# Patient Record
Sex: Female | Born: 1965
Health system: Southern US, Community
[De-identification: ages and names within clinical notes are randomized; demographics above are authoritative.]

## PROBLEM LIST (undated history)

## (undated) DIAGNOSIS — E538 Deficiency of other specified B group vitamins: Secondary | ICD-10-CM

## (undated) DIAGNOSIS — E539 Vitamin B deficiency, unspecified: Secondary | ICD-10-CM

## (undated) DIAGNOSIS — F32A Depression, unspecified: Secondary | ICD-10-CM

## (undated) DIAGNOSIS — G4730 Sleep apnea, unspecified: Secondary | ICD-10-CM

## (undated) DIAGNOSIS — L0390 Cellulitis, unspecified: Secondary | ICD-10-CM

## (undated) DIAGNOSIS — D509 Iron deficiency anemia, unspecified: Secondary | ICD-10-CM

## (undated) DIAGNOSIS — T7840XA Allergy, unspecified, initial encounter: Secondary | ICD-10-CM

## (undated) DIAGNOSIS — M7060 Trochanteric bursitis, unspecified hip: Secondary | ICD-10-CM

## (undated) DIAGNOSIS — J45909 Unspecified asthma, uncomplicated: Secondary | ICD-10-CM

## (undated) DIAGNOSIS — K635 Polyp of colon: Secondary | ICD-10-CM

## (undated) DIAGNOSIS — F329 Major depressive disorder, single episode, unspecified: Secondary | ICD-10-CM

## (undated) DIAGNOSIS — E038 Other specified hypothyroidism: Secondary | ICD-10-CM

## (undated) DIAGNOSIS — F419 Anxiety disorder, unspecified: Secondary | ICD-10-CM

## (undated) DIAGNOSIS — J189 Pneumonia, unspecified organism: Secondary | ICD-10-CM

## (undated) DIAGNOSIS — J329 Chronic sinusitis, unspecified: Secondary | ICD-10-CM

## (undated) DIAGNOSIS — E063 Autoimmune thyroiditis: Secondary | ICD-10-CM

## (undated) DIAGNOSIS — R519 Headache, unspecified: Secondary | ICD-10-CM

## (undated) DIAGNOSIS — K8020 Calculus of gallbladder without cholecystitis without obstruction: Secondary | ICD-10-CM

## (undated) DIAGNOSIS — E669 Obesity, unspecified: Secondary | ICD-10-CM

## (undated) DIAGNOSIS — R51 Headache: Secondary | ICD-10-CM

## (undated) DIAGNOSIS — E559 Vitamin D deficiency, unspecified: Secondary | ICD-10-CM

## (undated) DIAGNOSIS — M5416 Radiculopathy, lumbar region: Secondary | ICD-10-CM

## (undated) DIAGNOSIS — M5430 Sciatica, unspecified side: Secondary | ICD-10-CM

## (undated) DIAGNOSIS — R739 Hyperglycemia, unspecified: Secondary | ICD-10-CM

## (undated) DIAGNOSIS — R002 Palpitations: Secondary | ICD-10-CM

## (undated) DIAGNOSIS — D649 Anemia, unspecified: Secondary | ICD-10-CM

## (undated) DIAGNOSIS — G4700 Insomnia, unspecified: Secondary | ICD-10-CM

## (undated) DIAGNOSIS — E7800 Pure hypercholesterolemia, unspecified: Secondary | ICD-10-CM

## (undated) DIAGNOSIS — J3089 Other allergic rhinitis: Secondary | ICD-10-CM

## (undated) HISTORY — DX: Morbid (severe) obesity due to excess calories: E66.01

## (undated) HISTORY — DX: Obesity, unspecified: E66.9

## (undated) HISTORY — PX: EYE SURGERY: SHX253

## (undated) HISTORY — DX: Headache: R51

## (undated) HISTORY — PX: REFRACTIVE SURGERY: SHX103

## (undated) HISTORY — DX: Headache, unspecified: R51.9

## (undated) HISTORY — DX: Palpitations: R00.2

## (undated) HISTORY — DX: Calculus of gallbladder without cholecystitis without obstruction: K80.20

## (undated) HISTORY — DX: Other specified hypothyroidism: E03.8

## (undated) HISTORY — PX: CHOLECYSTECTOMY: SHX55

## (undated) HISTORY — DX: Cellulitis, unspecified: L03.90

## (undated) HISTORY — DX: Radiculopathy, lumbar region: M54.16

## (undated) HISTORY — DX: Pneumonia, unspecified organism: J18.9

## (undated) HISTORY — DX: Vitamin D deficiency, unspecified: E55.9

## (undated) HISTORY — DX: Autoimmune thyroiditis: E06.3

## (undated) HISTORY — DX: Pure hypercholesterolemia, unspecified: E78.00

## (undated) HISTORY — DX: Other allergic rhinitis: J30.89

## (undated) HISTORY — DX: Deficiency of other specified B group vitamins: E53.8

## (undated) HISTORY — DX: Vitamin B deficiency, unspecified: E53.9

## (undated) HISTORY — PX: WRIST SURGERY: SHX841

## (undated) HISTORY — DX: Sleep apnea, unspecified: G47.30

## (undated) HISTORY — DX: Insomnia, unspecified: G47.00

## (undated) HISTORY — DX: Polyp of colon: K63.5

## (undated) HISTORY — DX: Allergy, unspecified, initial encounter: T78.40XA

## (undated) HISTORY — PX: EYE MUSCLE SURGERY: SHX370

## (undated) HISTORY — DX: Trochanteric bursitis, unspecified hip: M70.60

## (undated) HISTORY — DX: Hyperglycemia, unspecified: R73.9

## (undated) HISTORY — DX: Chronic sinusitis, unspecified: J32.9

## (undated) HISTORY — PX: GASTRIC BYPASS: SHX52

## (undated) HISTORY — DX: Iron deficiency anemia, unspecified: D50.9

## (undated) HISTORY — PX: ORTHOPEDIC SURGERY: SHX850

## (undated) HISTORY — DX: Sciatica, unspecified side: M54.30

---

## 2011-09-08 ENCOUNTER — Encounter: Payer: Self-pay | Admitting: Internal Medicine

## 2012-04-26 ENCOUNTER — Encounter (HOSPITAL_COMMUNITY): Payer: Self-pay

## 2012-04-26 ENCOUNTER — Inpatient Hospital Stay (HOSPITAL_COMMUNITY)
Admission: EM | Admit: 2012-04-26 | Discharge: 2012-05-02 | DRG: 493 | Disposition: A | Discharge status: Home / Self Care | Payer: BC Managed Care – PPO | Attending: General Surgery | Admitting: General Surgery

## 2012-04-26 ENCOUNTER — Emergency Department (HOSPITAL_COMMUNITY): Payer: BC Managed Care – PPO

## 2012-04-26 DIAGNOSIS — K810 Acute cholecystitis: Principal | ICD-10-CM | POA: Diagnosis present

## 2012-04-26 DIAGNOSIS — Z79899 Other long term (current) drug therapy: Secondary | ICD-10-CM

## 2012-04-26 DIAGNOSIS — F329 Major depressive disorder, single episode, unspecified: Secondary | ICD-10-CM | POA: Diagnosis present

## 2012-04-26 DIAGNOSIS — F411 Generalized anxiety disorder: Secondary | ICD-10-CM | POA: Diagnosis present

## 2012-04-26 DIAGNOSIS — Z9884 Bariatric surgery status: Secondary | ICD-10-CM

## 2012-04-26 DIAGNOSIS — F3289 Other specified depressive episodes: Secondary | ICD-10-CM | POA: Diagnosis present

## 2012-04-26 DIAGNOSIS — J45909 Unspecified asthma, uncomplicated: Secondary | ICD-10-CM | POA: Diagnosis present

## 2012-04-26 DIAGNOSIS — D62 Acute posthemorrhagic anemia: Secondary | ICD-10-CM | POA: Diagnosis not present

## 2012-04-26 HISTORY — DX: Anxiety disorder, unspecified: F41.9

## 2012-04-26 HISTORY — DX: Depression, unspecified: F32.A

## 2012-04-26 HISTORY — DX: Major depressive disorder, single episode, unspecified: F32.9

## 2012-04-26 HISTORY — DX: Anemia, unspecified: D64.9

## 2012-04-26 HISTORY — DX: Unspecified asthma, uncomplicated: J45.909

## 2012-04-26 LAB — URINALYSIS, ROUTINE W REFLEX MICROSCOPIC
Glucose, UA: NEGATIVE mg/dL
Hgb urine dipstick: NEGATIVE
Leukocytes, UA: NEGATIVE
Protein, ur: 30 mg/dL — AB
Specific Gravity, Urine: 1.015 (ref 1.005–1.030)

## 2012-04-26 LAB — COMPREHENSIVE METABOLIC PANEL
ALT: 9 U/L (ref 0–35)
AST: 14 U/L (ref 0–37)
Albumin: 3.3 g/dL — ABNORMAL LOW (ref 3.5–5.2)
Calcium: 9 mg/dL (ref 8.4–10.5)
Creatinine, Ser: 0.72 mg/dL (ref 0.50–1.10)
GFR calc non Af Amer: >90 mL/min (ref >90)
Sodium: 139 mEq/L (ref 135–145)
Total Protein: 6.6 g/dL (ref 6.0–8.3)

## 2012-04-26 LAB — CBC WITH DIFFERENTIAL/PLATELET
Basophils Absolute: 0 10*3/uL (ref 0.0–0.1)
Basophils Relative: 0 % (ref 0–1)
Eosinophils Absolute: 0.2 10*3/uL (ref 0.0–0.7)
Eosinophils Relative: 2 % (ref 0–5)
HCT: 38.7 % (ref 36.0–46.0)
Lymphocytes Relative: 12 % (ref 12–46)
MCH: 31.1 pg (ref 26.0–34.0)
MCHC: 32.3 g/dL (ref 30.0–36.0)
MCV: 96.3 fL (ref 78.0–100.0)
Monocytes Absolute: 0.9 10*3/uL (ref 0.1–1.0)
Platelets: 203 10*3/uL (ref 150–400)
RDW: 15 % (ref 11.5–15.5)
WBC: 9.7 10*3/uL (ref 4.0–10.5)

## 2012-04-26 LAB — URINE MICROSCOPIC-ADD ON

## 2012-04-26 MED ORDER — MORPHINE SULFATE 4 MG/ML IJ SOLN: 2.0000 mg | Freq: Once | INTRAMUSCULAR | Status: DC

## 2012-04-26 MED ORDER — HYDROMORPHONE HCL PF 1 MG/ML IJ SOLN: 1.0000 mg | Freq: Once | INTRAMUSCULAR | Status: AC

## 2012-04-26 MED ORDER — HYDROMORPHONE HCL PF 1 MG/ML IJ SOLN
1.0000 mg | Freq: Once | INTRAMUSCULAR | Status: AC
  Administered 2012-04-26: 1 mg via INTRAVENOUS
  Filled 2012-04-26 (×2): qty 1

## 2012-04-26 MED ORDER — MORPHINE SULFATE 4 MG/ML IJ SOLN
2.0000 mg | Freq: Once | INTRAMUSCULAR | Status: AC
  Administered 2012-04-26: 06:00:00 via INTRAVENOUS
  Filled 2012-04-26: qty 1

## 2012-04-26 MED ORDER — PANTOPRAZOLE SODIUM 40 MG IV SOLR
INTRAVENOUS | Status: AC
  Filled 2012-04-26: qty 40

## 2012-04-26 MED ORDER — PANTOPRAZOLE SODIUM 40 MG IV SOLR
40.0000 mg | INTRAVENOUS | Status: AC
  Administered 2012-04-26: 40 mg via INTRAVENOUS

## 2012-04-26 MED ORDER — ONDANSETRON HCL 4 MG/2ML IJ SOLN
4.0000 mg | Freq: Once | INTRAMUSCULAR | Status: AC
  Administered 2012-04-26: 4 mg via INTRAVENOUS
  Filled 2012-04-26 (×2): qty 2

## 2012-04-26 MED ORDER — HYDROMORPHONE HCL PF 1 MG/ML IJ SOLN
1.0000 mg | INTRAMUSCULAR | Status: DC | PRN
  Administered 2012-04-27: 2 mg via INTRAVENOUS
  Administered 2012-04-27: 1 mg via INTRAVENOUS
  Filled 2012-04-26 (×2): qty 2

## 2012-04-26 MED ORDER — HYDROMORPHONE HCL PF 1 MG/ML IJ SOLN
1.0000 mg | INTRAMUSCULAR | Status: AC | PRN
  Administered 2012-04-26 (×6): 1 mg via INTRAVENOUS
  Filled 2012-04-26 (×6): qty 1

## 2012-04-26 MED ORDER — MORPHINE SULFATE 2 MG/ML IJ SOLN
2.0000 mg | Freq: Once | INTRAMUSCULAR | Status: AC
  Administered 2012-04-26: 2 mg via INTRAVENOUS
  Filled 2012-04-26: qty 1

## 2012-04-26 MED ORDER — SODIUM CHLORIDE 0.9 % IV SOLN: INTRAVENOUS | Status: DC

## 2012-04-26 MED ORDER — PANTOPRAZOLE SODIUM 40 MG IV SOLR
40.0000 mg | Freq: Every day | INTRAVENOUS | Status: DC
  Administered 2012-04-26 – 2012-04-27 (×2): 40 mg via INTRAVENOUS
  Filled 2012-04-26 (×2): qty 40

## 2012-04-26 MED ORDER — SODIUM CHLORIDE 0.9 % IV SOLN
Freq: Once | INTRAVENOUS | Status: AC
  Administered 2012-04-26: 1000 mL via INTRAVENOUS

## 2012-04-26 MED ORDER — HYDROMORPHONE HCL PF 1 MG/ML IJ SOLN
INTRAMUSCULAR | Status: AC
  Administered 2012-04-26: 1 mg via INTRAVENOUS
  Filled 2012-04-26: qty 1

## 2012-04-26 MED ORDER — ONDANSETRON HCL 4 MG/2ML IJ SOLN
4.0000 mg | Freq: Four times a day (QID) | INTRAMUSCULAR | Status: AC | PRN
  Filled 2012-04-26: qty 2

## 2012-04-26 MED ORDER — ONDANSETRON HCL 4 MG/2ML IJ SOLN
4.0000 mg | Freq: Four times a day (QID) | INTRAMUSCULAR | Status: DC | PRN
  Administered 2012-04-26 – 2012-04-27 (×2): 4 mg via INTRAVENOUS

## 2012-04-26 MED ORDER — ENOXAPARIN SODIUM 40 MG/0.4ML ~~LOC~~ SOLN
40.0000 mg | SUBCUTANEOUS | Status: DC
  Administered 2012-04-26 – 2012-05-01 (×5): 40 mg via SUBCUTANEOUS
  Filled 2012-04-26 (×6): qty 0.4

## 2012-04-26 MED ORDER — LACTATED RINGERS IV SOLN
INTRAVENOUS | Status: DC
  Administered 2012-04-26 – 2012-04-30 (×11): via INTRAVENOUS

## 2012-04-26 MED ORDER — ONDANSETRON HCL 4 MG/2ML IJ SOLN
4.0000 mg | Freq: Once | INTRAMUSCULAR | Status: AC
  Administered 2012-04-26: 4 mg via INTRAVENOUS
  Filled 2012-04-26: qty 2

## 2012-04-26 NOTE — ED Notes (Addendum)
Patient states pain is returning to abdomen going up toward her chest area. Spoke with her nurse Marius Ditch RN.

## 2012-04-26 NOTE — ED Notes (Signed)
Pt c/o pain to right upper abd that sometimes radiates around to the right flank, mild nausea, no vomiting or diarrhea

## 2012-04-26 NOTE — ED Provider Notes (Deleted)
History     CSN: [id number]  Arrival date & time 04/26/12  0519   First MD Initiated Contact with Patient 04/26/12 0534      Chief Complaint  Patient presents with  . Abdominal Pain    (Consider location/radiation/quality/duration/timing/severity/associated sxs/prior treatment) HPI Kristy Sharp is a 47 y.o. female who presents to the Emergency Department complaining of RUQ pain that began last night around 10 PM and lasted until midnight. She went to work at 4 AM and it became worse. Now RUQ pain with radiation to R flank is constant, associated with nausea. Denies fever, chills, vomiting, diarrhea. She last ate at 7 PM.  PCP [place], Jonita Albee Past Medical History  Diagnosis Date  . Anemia   . Depression   . Anxiety   . Asthma     Past Surgical History  Procedure Laterality Date  . Gastric bypass    . Orthopedic surgery      No family history on file.  History  Substance Use Topics  . Smoking status: Never Smoker   . Smokeless tobacco: Not on file  . Alcohol Use: Yes    OB History   Grav Para Term Preterm Abortions TAB SAB Ect Mult Living                  Review of Systems  Constitutional: Negative for fever.       10 Systems reviewed and are negative for acute change except as noted in the HPI.  HENT: Negative for congestion.   Eyes: Negative for discharge and redness.  Respiratory: Negative for cough and shortness of breath.   Cardiovascular: Negative for chest pain.  Gastrointestinal: Negative for vomiting and abdominal pain.       RUQ pain  Musculoskeletal: Negative for back pain.  Skin: Negative for rash.  Neurological: Negative for syncope, numbness and headaches.  Psychiatric/Behavioral:       No behavior change.    Allergies  Review of patient's allergies indicates no known allergies.  Home Medications  No current outpatient prescriptions on file.  BP 148/74  Pulse 74  Temp(Src) 99.1 F (37.3 C) (Oral)  Ht 5\' 6"  (1.676 m)  Wt 340 lb  (154.223 kg)  BMI 54.9 kg/m2  SpO2 100%  LMP 04/12/2012  Physical Exam  Nursing note and vitals reviewed. Constitutional: She appears well-developed and well-nourished.  Awake, alert, nontoxic appearance.  HENT:  Head: Normocephalic and atraumatic.  Eyes: EOM are normal. Pupils are equal, round, and reactive to light. Right eye exhibits no discharge. Left eye exhibits no discharge.  Neck: Normal range of motion. Neck supple.  Cardiovascular: Normal rate and normal heart sounds.   Pulmonary/Chest: Effort normal and breath sounds normal. She exhibits no tenderness.  Abdominal: Soft. There is tenderness. There is no rebound and no guarding.  RUQ discomfort with palpation.  Musculoskeletal: She exhibits no tenderness.  Baseline ROM, no obvious new focal weakness.  Neurological:  Mental status and motor strength appears baseline for patient and situation.  Skin: No rash noted.  Psychiatric: She has a normal mood and affect.    ED Course  Procedures (including critical care time) Results for orders placed during the hospital encounter of 04/26/12  CBC WITH DIFFERENTIAL      Result Value Range   WBC 9.7  4.0 - 10.5 K/uL   RBC 4.02  3.87 - 5.11 MIL/uL   Hemoglobin 12.5  12.0 - 15.0 g/dL   HCT 84.6  96.2 - 95.2 %   MCV  96.3  78.0 - 100.0 fL   MCH 31.1  26.0 - 34.0 pg   MCHC 32.3  30.0 - 36.0 g/dL   RDW 16.1  09.6 - 04.5 %   Platelets 203  150 - 400 K/uL   Neutrophils Relative 76  43 - 77 %   Neutro Abs 7.3  1.7 - 7.7 K/uL   Lymphocytes Relative 12  12 - 46 %   Lymphs Abs 1.2  0.7 - 4.0 K/uL   Monocytes Relative 10  3 - 12 %   Monocytes Absolute 0.9  0.1 - 1.0 K/uL   Eosinophils Relative 2  0 - 5 %   Eosinophils Absolute 0.2  0.0 - 0.7 K/uL   Basophils Relative 0  0 - 1 %   Basophils Absolute 0.0  0.0 - 0.1 K/uL  COMPREHENSIVE METABOLIC PANEL      Result Value Range   Sodium 139  135 - 145 mEq/L   Potassium 3.4 (*) 3.5 - 5.1 mEq/L   Chloride 105  96 - 112 mEq/L   CO2 25   19 - 32 mEq/L   Glucose, Bld 112 (*) 70 - 99 mg/dL   BUN 12  6 - 23 mg/dL   Creatinine, Ser 4.09  0.50 - 1.10 mg/dL   Calcium 9.0  8.4 - 81.1 mg/dL   Total Protein 6.6  6.0 - 8.3 g/dL   Albumin 3.3 (*) 3.5 - 5.2 g/dL   AST 14  0 - 37 U/L   ALT 9  0 - 35 U/L   Alkaline Phosphatase 54  39 - 117 U/L   Total Bilirubin 0.3  0.3 - 1.2 mg/dL   GFR calc non Af Amer >90  >90 mL/min   GFR calc Af Amer >90  >90 mL/min  AMYLASE      Result Value Range   Amylase 33  0 - 105 U/L       MDM  Patient with RUQ pain with radiation to the R flank. Given IVF, morphine, zofran. Scheduled for [place] this AM. Labs are unremarkable.         Nicoletta Dress. Colon Branch, MD 04/26/12 [phone]

## 2012-04-26 NOTE — ED Notes (Signed)
Pt states her pain is coming back. EDP aware. meds ordered

## 2012-04-26 NOTE — ED Provider Notes (Signed)
History     CSN: [id number]  Arrival date & time 04/26/12  0519   First MD Initiated Contact with Patient 04/26/12 0534      Chief Complaint  Patient presents with  . Abdominal Pain    (Consider location/radiation/quality/duration/timing/severity/associated sxs/prior treatment) HPI  Past Medical History  Diagnosis Date  . Anemia   . Depression   . Anxiety   . Asthma     Past Surgical History  Procedure Laterality Date  . Gastric bypass    . Orthopedic surgery      No family history on file.  History  Substance Use Topics  . Smoking status: Never Smoker   . Smokeless tobacco: Not on file  . Alcohol Use: Yes    OB History   Grav Para Term Preterm Abortions TAB SAB Ect Mult Living                  Review of Systems  Allergies  Review of patient's allergies indicates no known allergies.  Home Medications  No current outpatient prescriptions on file.  BP 148/74  Pulse 74  Temp(Src) 99.1 F (37.3 C) (Oral)  Ht 5\' 6"  (1.676 m)  Wt 340 lb (154.223 kg)  BMI 54.9 kg/m2  SpO2 100%  LMP 04/12/2012  Physical Exam  ED Course  Procedures (including critical care time) Results for orders placed during the hospital encounter of 04/26/12  CBC WITH DIFFERENTIAL      Result Value Range   WBC 9.7  4.0 - 10.5 K/uL   RBC 4.02  3.87 - 5.11 MIL/uL   Hemoglobin 12.5  12.0 - 15.0 g/dL   HCT 08.6  57.8 - 46.9 %   MCV 96.3  78.0 - 100.0 fL   MCH 31.1  26.0 - 34.0 pg   MCHC 32.3  30.0 - 36.0 g/dL   RDW 62.9  52.8 - 41.3 %   Platelets 203  150 - 400 K/uL   Neutrophils Relative 76  43 - 77 %   Neutro Abs 7.3  1.7 - 7.7 K/uL   Lymphocytes Relative 12  12 - 46 %   Lymphs Abs 1.2  0.7 - 4.0 K/uL   Monocytes Relative 10  3 - 12 %   Monocytes Absolute 0.9  0.1 - 1.0 K/uL   Eosinophils Relative 2  0 - 5 %   Eosinophils Absolute 0.2  0.0 - 0.7 K/uL   Basophils Relative 0  0 - 1 %   Basophils Absolute 0.0  0.0 - 0.1 K/uL  COMPREHENSIVE METABOLIC PANEL      Result  Value Range   Sodium 139  135 - 145 mEq/L   Potassium 3.4 (*) 3.5 - 5.1 mEq/L   Chloride 105  96 - 112 mEq/L   CO2 25  19 - 32 mEq/L   Glucose, Bld 112 (*) 70 - 99 mg/dL   BUN 12  6 - 23 mg/dL   Creatinine, Ser 2.44  0.50 - 1.10 mg/dL   Calcium 9.0  8.4 - 01.0 mg/dL   Total Protein 6.6  6.0 - 8.3 g/dL   Albumin 3.3 (*) 3.5 - 5.2 g/dL   AST 14  0 - 37 U/L   ALT 9  0 - 35 U/L   Alkaline Phosphatase 54  39 - 117 U/L   Total Bilirubin 0.3  0.3 - 1.2 mg/dL   GFR calc non Af Amer >90  >90 mL/min   GFR calc Af Amer >90  >90 mL/min  AMYLASE      Result Value Range   Amylase 33  0 - 105 U/L      MDM  Patient with RUQ pain that radiates into her right flank and epigastric area. Given IVF, Morphine, Zofran. Awaiting CT scan.   MDM Reviewed: nursing note and vitals Interpretation: labs           Nicoletta Dress. Colon Branch, MD 04/26/12 [phone]

## 2012-04-26 NOTE — H&P (Signed)
Kristy Sharp is an 47 y.o. female.   Chief Complaint: Right upper quadrant pain HPI: Patient has had Right upper quadrant pain over the last several days.  Associated nausea.  Some fever and chills.  No change with BM.  No melena.  NO hematochezia.  Pain increased with fatty foods.  Some bloating.  NO jaundice.    Past Medical History  Diagnosis Date  . Anemia   . Depression   . Anxiety   . Asthma     Past Surgical History  Procedure Laterality Date  . Gastric bypass    . Orthopedic surgery      History reviewed. No pertinent family history. Social History:  reports that she has never smoked. She does not have any smokeless tobacco history on file. She reports that  drinks alcohol. She reports that she does not use illicit drugs.  Allergies: No Known Allergies  Medications Prior to Admission  Medication Sig Dispense Refill  . albuterol (PROVENTIL HFA;VENTOLIN HFA) 108 (90 BASE) MCG/ACT inhaler Inhale 2 puffs into the lungs every 6 (six) hours as needed for wheezing.      Marland Kitchen buPROPion (WELLBUTRIN) 100 MG tablet Take 100 mg by mouth 2 (two) times daily.      . citalopram (CELEXA) 40 MG tablet Take 60 mg by mouth daily.      . cyanocobalamin (,VITAMIN B-12,) 1000 MCG/ML injection Inject 1,000 mcg into the muscle every 30 (thirty) days.      . diphenhydrAMINE (BENADRYL) 25 mg capsule Take 25 mg by mouth every 6 (six) hours as needed for itching.      . ferrous sulfate 325 (65 FE) MG tablet Take 325 mg by mouth daily with breakfast.      . ibuprofen (ADVIL,MOTRIN) 200 MG tablet Take by mouth every 6 (six) hours as needed for pain.      Marland Kitchen LORazepam (ATIVAN) 0.5 MG tablet Take 0.5 mg by mouth at bedtime.      . montelukast (SINGULAIR) 10 MG tablet Take 10 mg by mouth at bedtime.      . traZODone (DESYREL) 50 MG tablet Take 100 mg by mouth at bedtime as needed for sleep.      Marland Kitchen triamcinolone cream (KENALOG) 0.1 % Apply topically 2 (two) times daily.      . Vitamin D, Ergocalciferol,  (DRISDOL) 50000 UNITS CAPS Take 50,000 Units by mouth every 7 (seven) days.        Results for orders placed during the hospital encounter of 04/26/12 (from the past 48 hour(s))  CBC WITH DIFFERENTIAL     Status: None   Collection Time    04/26/12  5:39 AM      Result Value Range   WBC 9.7  4.0 - 10.5 K/uL   RBC 4.02  3.87 - 5.11 MIL/uL   Hemoglobin 12.5  12.0 - 15.0 g/dL   HCT 47.8  29.5 - 62.1 %   MCV 96.3  78.0 - 100.0 fL   MCH 31.1  26.0 - 34.0 pg   MCHC 32.3  30.0 - 36.0 g/dL   RDW 30.8  65.7 - 84.6 %   Platelets 203  150 - 400 K/uL   Neutrophils Relative 76  43 - 77 %   Neutro Abs 7.3  1.7 - 7.7 K/uL   Lymphocytes Relative 12  12 - 46 %   Lymphs Abs 1.2  0.7 - 4.0 K/uL   Monocytes Relative 10  3 - 12 %   Monocytes Absolute 0.9  0.1 - 1.0 K/uL   Eosinophils Relative 2  0 - 5 %   Eosinophils Absolute 0.2  0.0 - 0.7 K/uL   Basophils Relative 0  0 - 1 %   Basophils Absolute 0.0  0.0 - 0.1 K/uL  COMPREHENSIVE METABOLIC PANEL     Status: Abnormal   Collection Time    04/26/12  5:39 AM      Result Value Range   Sodium 139  135 - 145 mEq/L   Potassium 3.4 (*) 3.5 - 5.1 mEq/L   Chloride 105  96 - 112 mEq/L   CO2 25  19 - 32 mEq/L   Glucose, Bld 112 (*) 70 - 99 mg/dL   BUN 12  6 - 23 mg/dL   Creatinine, Ser 0.45  0.50 - 1.10 mg/dL   Calcium 9.0  8.4 - 40.9 mg/dL   Total Protein 6.6  6.0 - 8.3 g/dL   Albumin 3.3 (*) 3.5 - 5.2 g/dL   AST 14  0 - 37 U/L   ALT 9  0 - 35 U/L   Alkaline Phosphatase 54  39 - 117 U/L   Total Bilirubin 0.3  0.3 - 1.2 mg/dL   GFR calc non Af Amer >90  >90 mL/min   GFR calc Af Amer >90  >90 mL/min   Comment:            The eGFR has been calculated     using the CKD EPI equation.     This calculation has not been     validated in all clinical     situations.     eGFR's persistently     <90 mL/min signify     possible Chronic Kidney Disease.  AMYLASE     Status: None   Collection Time    04/26/12  5:39 AM      Result Value Range   Amylase  33  0 - 105 U/L  URINALYSIS, ROUTINE W REFLEX MICROSCOPIC     Status: Abnormal   Collection Time    04/26/12  8:23 AM      Result Value Range   Color, Urine YELLOW  YELLOW   APPearance CLEAR  CLEAR   Specific Gravity, Urine 1.015  1.005 - 1.030   pH >9.0 (*) 5.0 - 8.0   Glucose, UA NEGATIVE  NEGATIVE mg/dL   Hgb urine dipstick NEGATIVE  NEGATIVE   Bilirubin Urine NEGATIVE  NEGATIVE   Ketones, ur 15 (*) NEGATIVE mg/dL   Protein, ur 30 (*) NEGATIVE mg/dL   Urobilinogen, UA 0.2  0.0 - 1.0 mg/dL   Nitrite NEGATIVE  NEGATIVE   Leukocytes, UA NEGATIVE  NEGATIVE  URINE MICROSCOPIC-ADD ON     Status: Abnormal   Collection Time    04/26/12  8:23 AM      Result Value Range   Squamous Epithelial / LPF FEW (*) RARE   RBC / HPF 0-2  <3 RBC/hpf   US Abdomen Limited Ruq  04/26/2012  *RADIOLOGY REPORT*  Clinical Data:  History of right upper quadrant abdominal pain.  LIMITED ABDOMINAL ULTRASOUND - RIGHT UPPER QUADRANT  Comparison:  None  Findings:  Gallbladder: There is cholelithiasis.  Tiny echogenic foci are seen.  There is some posterior acoustic shadowing.  These are mixed with sludge. There is gallbladder wall thickening. Gallbladder wall thickness measured 6.9 mm.  There is no evidence of pericholecystic fluid. Negative sonographic Murphy's sign according to the ultrasound technologist.  CBD: Dilated in caliber measuring approximately 7  mm. No dilatation of branching intrahepatic bile ducts is evident.  No choledocholithiasis is demonstrated.  Some portions of the common bile duct obscured by bowel gas.  Liver: Upper normal size and echotexture without focal parenchymal abnormality.  Right Kidney:  No hydronephrosis.  IMPRESSION:  Material was seen within the gallbladder with multiple echogenic foci mixed with sludge.  There appears to be some small amount of posterior acoustic shadowing consistent with cholelithiasis mixed with sludge.The gallbladder is distended. There is thickening of the  gallbladder wall which measured  7 mm.  No pericholecystic fluid is evident.  There was no positive ultrasonic Murphy's sign according to technologist.  Abnormal moderate dilatation of common bile duct without evidence of dilatation of branching intrahepatic bile ducts.  No choledocholithiasis was identified.  Some portions were obscured by bowel gas.  Details of the examination were also compromised by body habitus.   Original Report Authenticated By: Onalee Hua Call     Review of Systems  Constitutional: Positive for chills. Negative for fever.  HENT: Negative.   Eyes: Negative.   Respiratory: Negative.   Cardiovascular: Negative.   Gastrointestinal: Positive for heartburn, nausea, vomiting and abdominal pain (RUQ). Negative for diarrhea, constipation, blood in stool and melena.  Genitourinary: Negative.   Musculoskeletal: Negative.   Skin: Negative.   Neurological: Negative.   Endo/Heme/Allergies: Negative.   Psychiatric/Behavioral: Negative.     Blood pressure 109/66, pulse 84, temperature 99.3 F (37.4 C), temperature source Oral, resp. rate 20, height 5\' 6"  (1.676 m), weight 156.4 kg (344 lb 12.8 oz), last menstrual period 04/12/2012, SpO2 93.00%. Physical Exam  Constitutional: She is oriented to person, place, and time. She appears well-developed and well-nourished. No distress.  HENT:  Head: Normocephalic.  Eyes: Conjunctivae and EOM are normal. Pupils are equal, round, and reactive to light. No scleral icterus.  Neck: Normal range of motion. Neck supple. No tracheal deviation present. No thyromegaly present.  Cardiovascular: Normal rate and regular rhythm.   Respiratory: Effort normal and breath sounds normal. No respiratory distress.  GI: Soft. Bowel sounds are normal. She exhibits no distension and no mass. There is tenderness (RUQ pain.  + Murphy's sign). There is no rebound and no guarding.  Lymphadenopathy:    She has no cervical adenopathy.  Neurological: She is alert and  oriented to person, place, and time.  Skin: Skin is warm and dry.     Assessment/Plan Acute cholecystitis.  NPO after midnight.  Will continue IV hydration.  DVT prophylaxis.  Risks, benefits, alternatives discussed with patient.  Will schedule for tomorrow.  ZIEGLER,BRENT C 04/26/2012, 10:11 PM

## 2012-04-27 ENCOUNTER — Encounter (HOSPITAL_COMMUNITY)
Admission: EM | Disposition: A | Discharge status: Home / Self Care | Payer: Self-pay | Source: Home / Self Care | Attending: General Surgery

## 2012-04-27 ENCOUNTER — Inpatient Hospital Stay (HOSPITAL_COMMUNITY): Payer: BC Managed Care – PPO | Admitting: Anesthesiology

## 2012-04-27 ENCOUNTER — Encounter (HOSPITAL_COMMUNITY): Payer: Self-pay | Admitting: Anesthesiology

## 2012-04-27 ENCOUNTER — Encounter (HOSPITAL_COMMUNITY): Payer: Self-pay | Admitting: *Deleted

## 2012-04-27 HISTORY — PX: CHOLECYSTECTOMY: SHX55

## 2012-04-27 LAB — CBC
MCH: 31.7 pg (ref 26.0–34.0)
MCHC: 32.3 g/dL (ref 30.0–36.0)
Platelets: 168 10*3/uL (ref 150–400)
Platelets: 185 10*3/uL (ref 150–400)
RBC: 3.91 MIL/uL (ref 3.87–5.11)
RDW: 14.9 % (ref 11.5–15.5)
WBC: 17 10*3/uL — ABNORMAL HIGH (ref 4.0–10.5)

## 2012-04-27 LAB — BASIC METABOLIC PANEL
BUN: 10 mg/dL (ref 6–23)
Calcium: 8.5 mg/dL (ref 8.4–10.5)
GFR calc Af Amer: >90 mL/min (ref >90)
GFR calc non Af Amer: >90 mL/min (ref >90)
Glucose, Bld: 127 mg/dL — ABNORMAL HIGH (ref 70–99)
Potassium: 4.1 mEq/L (ref 3.5–5.1)
Sodium: 136 mEq/L (ref 135–145)

## 2012-04-27 LAB — SURGICAL PCR SCREEN
MRSA, PCR: NEGATIVE
Staphylococcus aureus: NEGATIVE

## 2012-04-27 SURGERY — LAPAROSCOPIC CHOLECYSTECTOMY
Anesthesia: General | Site: Abdomen | Wound class: Contaminated

## 2012-04-27 MED ORDER — FENTANYL CITRATE 0.05 MG/ML IJ SOLN
INTRAMUSCULAR | Status: AC
  Filled 2012-04-27: qty 2

## 2012-04-27 MED ORDER — GLYCOPYRROLATE 0.2 MG/ML IJ SOLN
INTRAMUSCULAR | Status: DC | PRN
  Administered 2012-04-27: 0.6 mg via INTRAVENOUS

## 2012-04-27 MED ORDER — MIDAZOLAM HCL 2 MG/2ML IJ SOLN
1.0000 mg | INTRAMUSCULAR | Status: DC | PRN
  Administered 2012-04-27: 2 mg via INTRAVENOUS

## 2012-04-27 MED ORDER — CEFAZOLIN SODIUM-DEXTROSE 2-3 GM-% IV SOLR
2.0000 g | INTRAVENOUS | Status: DC
  Filled 2012-04-27: qty 50

## 2012-04-27 MED ORDER — BUPROPION HCL 100 MG PO TABS
100.0000 mg | ORAL_TABLET | Freq: Two times a day (BID) | ORAL | Status: DC
  Administered 2012-04-27 – 2012-05-02 (×11): 100 mg via ORAL
  Filled 2012-04-27 (×15): qty 1

## 2012-04-27 MED ORDER — SUCCINYLCHOLINE CHLORIDE 20 MG/ML IJ SOLN
INTRAMUSCULAR | Status: DC | PRN
  Administered 2012-04-27: 180 mg via INTRAVENOUS

## 2012-04-27 MED ORDER — SODIUM CHLORIDE 0.9 % IR SOLN
Status: DC | PRN
  Administered 2012-04-27: 1000 mL

## 2012-04-27 MED ORDER — LORAZEPAM 0.5 MG PO TABS
0.5000 mg | ORAL_TABLET | Freq: Every day | ORAL | Status: DC
  Administered 2012-04-27 – 2012-05-01 (×5): 0.5 mg via ORAL
  Filled 2012-04-27 (×5): qty 1

## 2012-04-27 MED ORDER — ROCURONIUM BROMIDE 100 MG/10ML IV SOLN
INTRAVENOUS | Status: DC | PRN
  Administered 2012-04-27: 5 mg via INTRAVENOUS
  Administered 2012-04-27: 40 mg via INTRAVENOUS

## 2012-04-27 MED ORDER — HYDROMORPHONE HCL PF 1 MG/ML IJ SOLN
1.0000 mg | INTRAMUSCULAR | Status: DC | PRN
  Administered 2012-04-27 – 2012-04-28 (×5): 2 mg via INTRAVENOUS
  Filled 2012-04-27 (×5): qty 2

## 2012-04-27 MED ORDER — HEMOSTATIC AGENTS (NO CHARGE) OPTIME
TOPICAL | Status: DC | PRN
  Administered 2012-04-27: 2 via TOPICAL
  Administered 2012-04-27: 1 via TOPICAL

## 2012-04-27 MED ORDER — LACTATED RINGERS IV SOLN
INTRAVENOUS | Status: DC
  Administered 2012-04-27: 10:00:00 via INTRAVENOUS

## 2012-04-27 MED ORDER — MIDAZOLAM HCL 2 MG/2ML IJ SOLN
INTRAMUSCULAR | Status: AC
  Filled 2012-04-27: qty 2

## 2012-04-27 MED ORDER — NEOSTIGMINE METHYLSULFATE 1 MG/ML IJ SOLN
INTRAMUSCULAR | Status: DC | PRN
  Administered 2012-04-27: 4 mg via INTRAVENOUS

## 2012-04-27 MED ORDER — GLYCOPYRROLATE 0.2 MG/ML IJ SOLN
INTRAMUSCULAR | Status: AC
  Filled 2012-04-27: qty 1

## 2012-04-27 MED ORDER — ONDANSETRON HCL 4 MG/2ML IJ SOLN
4.0000 mg | Freq: Once | INTRAMUSCULAR | Status: AC | PRN
  Filled 2012-04-27: qty 2

## 2012-04-27 MED ORDER — CEFAZOLIN SODIUM-DEXTROSE 2-3 GM-% IV SOLR
INTRAVENOUS | Status: AC
  Filled 2012-04-27: qty 50

## 2012-04-27 MED ORDER — DEXTROSE 5 % IV SOLN
3.0000 g | INTRAVENOUS | Status: DC | PRN
  Administered 2012-04-27: 3 g via INTRAVENOUS

## 2012-04-27 MED ORDER — GLYCOPYRROLATE 0.2 MG/ML IJ SOLN
0.2000 mg | Freq: Once | INTRAMUSCULAR | Status: AC
  Administered 2012-04-27: 0.2 mg via INTRAVENOUS

## 2012-04-27 MED ORDER — LIDOCAINE HCL 1 % IJ SOLN
INTRAMUSCULAR | Status: DC | PRN
  Administered 2012-04-27: 50 mg via INTRADERMAL

## 2012-04-27 MED ORDER — ONDANSETRON HCL 4 MG/2ML IJ SOLN
4.0000 mg | Freq: Once | INTRAMUSCULAR | Status: AC
  Administered 2012-04-27: 4 mg via INTRAVENOUS

## 2012-04-27 MED ORDER — SUCCINYLCHOLINE CHLORIDE 20 MG/ML IJ SOLN
INTRAMUSCULAR | Status: AC
  Filled 2012-04-27: qty 1

## 2012-04-27 MED ORDER — TRAZODONE HCL 50 MG PO TABS: 100.0000 mg | ORAL_TABLET | Freq: Every evening | ORAL | Status: DC | PRN

## 2012-04-27 MED ORDER — ALBUTEROL SULFATE (5 MG/ML) 0.5% IN NEBU
2.5000 mg | INHALATION_SOLUTION | Freq: Once | RESPIRATORY_TRACT | Status: AC
  Administered 2012-04-27: 2.5 mg via RESPIRATORY_TRACT

## 2012-04-27 MED ORDER — CITALOPRAM HYDROBROMIDE 20 MG PO TABS
60.0000 mg | ORAL_TABLET | Freq: Every day | ORAL | Status: DC
  Administered 2012-04-27 – 2012-05-02 (×6): 60 mg via ORAL
  Filled 2012-04-27 (×6): qty 3

## 2012-04-27 MED ORDER — LIDOCAINE HCL (PF) 1 % IJ SOLN
INTRAMUSCULAR | Status: AC
  Filled 2012-04-27: qty 5

## 2012-04-27 MED ORDER — MIDAZOLAM HCL 5 MG/5ML IJ SOLN
INTRAMUSCULAR | Status: DC | PRN
  Administered 2012-04-27 (×2): 2 mg via INTRAVENOUS

## 2012-04-27 MED ORDER — MONTELUKAST SODIUM 10 MG PO TABS
10.0000 mg | ORAL_TABLET | Freq: Every day | ORAL | Status: DC
  Administered 2012-04-27 – 2012-05-01 (×5): 10 mg via ORAL
  Filled 2012-04-27 (×5): qty 1

## 2012-04-27 MED ORDER — DEXTROSE 5 % IV SOLN: 3.0000 g | INTRAVENOUS | Status: DC

## 2012-04-27 MED ORDER — CELECOXIB 100 MG PO CAPS
200.0000 mg | ORAL_CAPSULE | Freq: Two times a day (BID) | ORAL | Status: DC
  Administered 2012-04-27 – 2012-05-02 (×11): 200 mg via ORAL
  Filled 2012-04-27 (×11): qty 2
  Filled 2012-04-27: qty 1

## 2012-04-27 MED ORDER — ROCURONIUM BROMIDE 50 MG/5ML IV SOLN
INTRAVENOUS | Status: AC
  Filled 2012-04-27: qty 1

## 2012-04-27 MED ORDER — FENTANYL CITRATE 0.05 MG/ML IJ SOLN
INTRAMUSCULAR | Status: DC | PRN
  Administered 2012-04-27 (×3): 50 ug via INTRAVENOUS
  Administered 2012-04-27: 150 ug via INTRAVENOUS
  Administered 2012-04-27: 50 ug via INTRAVENOUS

## 2012-04-27 MED ORDER — ALBUTEROL SULFATE (5 MG/ML) 0.5% IN NEBU
INHALATION_SOLUTION | RESPIRATORY_TRACT | Status: AC
  Filled 2012-04-27: qty 0.5

## 2012-04-27 MED ORDER — FENTANYL CITRATE 0.05 MG/ML IJ SOLN
25.0000 ug | INTRAMUSCULAR | Status: DC | PRN
  Administered 2012-04-27 (×2): 50 ug via INTRAVENOUS

## 2012-04-27 MED ORDER — SODIUM CHLORIDE 0.9 % IR SOLN
Status: DC | PRN
  Administered 2012-04-27: 3000 mL

## 2012-04-27 MED ORDER — DEXTROSE 5 % IV SOLN
2.0000 g | Freq: Three times a day (TID) | INTRAVENOUS | Status: DC
  Administered 2012-04-27 – 2012-05-02 (×15): 2 g via INTRAVENOUS
  Filled 2012-04-27 (×19): qty 2

## 2012-04-27 MED ORDER — CEFAZOLIN SODIUM 1-5 GM-% IV SOLN
INTRAVENOUS | Status: AC
  Filled 2012-04-27: qty 50

## 2012-04-27 MED ORDER — BUPIVACAINE HCL (PF) 0.5 % IJ SOLN
INTRAMUSCULAR | Status: DC | PRN
  Administered 2012-04-27: 20 mL

## 2012-04-27 MED ORDER — ONDANSETRON HCL 4 MG/2ML IJ SOLN
INTRAMUSCULAR | Status: AC
  Filled 2012-04-27: qty 2

## 2012-04-27 MED ORDER — FENTANYL CITRATE 0.05 MG/ML IJ SOLN
INTRAMUSCULAR | Status: AC
  Filled 2012-04-27: qty 5

## 2012-04-27 MED ORDER — HYDROCODONE-ACETAMINOPHEN 5-325 MG PO TABS
1.0000 | ORAL_TABLET | ORAL | Status: DC | PRN
Start: 2012-04-27 — End: 2012-05-02
  Administered 2012-04-28 – 2012-04-29 (×4): 2 via ORAL
  Administered 2012-04-30 – 2012-05-01 (×3): 1 via ORAL
  Filled 2012-04-27: qty 2
  Filled 2012-04-27: qty 1
  Filled 2012-04-27 (×2): qty 2
  Filled 2012-04-27 (×2): qty 1
  Filled 2012-04-27 (×2): qty 2

## 2012-04-27 MED ORDER — GLYCOPYRROLATE 0.2 MG/ML IJ SOLN
INTRAMUSCULAR | Status: AC
  Filled 2012-04-27: qty 3

## 2012-04-27 MED ORDER — CHLORHEXIDINE GLUCONATE CLOTH 2 % EX PADS: 6.0000 | MEDICATED_PAD | Freq: Every day | CUTANEOUS | Status: AC

## 2012-04-27 MED ORDER — PROPOFOL 10 MG/ML IV BOLUS
INTRAVENOUS | Status: DC | PRN
  Administered 2012-04-27: 180 mg via INTRAVENOUS

## 2012-04-27 MED ORDER — SODIUM CHLORIDE 0.9 % IN NEBU
INHALATION_SOLUTION | RESPIRATORY_TRACT | Status: AC
  Filled 2012-04-27: qty 3

## 2012-04-27 MED ORDER — PROPOFOL 10 MG/ML IV EMUL
INTRAVENOUS | Status: AC
  Filled 2012-04-27: qty 20

## 2012-04-27 MED ORDER — CEFAZOLIN SODIUM 1-5 GM-% IV SOLN
1.0000 g | INTRAVENOUS | Status: DC
  Filled 2012-04-27: qty 50

## 2012-04-27 MED ORDER — BUPIVACAINE HCL (PF) 0.5 % IJ SOLN
INTRAMUSCULAR | Status: AC
  Filled 2012-04-27: qty 30

## 2012-04-27 MED ORDER — CHLORHEXIDINE GLUCONATE 4 % EX LIQD: 1.0000 "application " | Freq: Once | CUTANEOUS | Status: DC

## 2012-04-27 MED ORDER — ALBUTEROL SULFATE HFA 108 (90 BASE) MCG/ACT IN AERS: 2.0000 | INHALATION_SPRAY | Freq: Four times a day (QID) | RESPIRATORY_TRACT | Status: DC | PRN

## 2012-04-27 SURGICAL SUPPLY — 51 items
APPLIER CLIP UNV 5X34 EPIX (ENDOMECHANICALS) ×2 IMPLANT
BAG HAMPER (MISCELLANEOUS) ×2 IMPLANT
BENZOIN TINCTURE PRP APPL 2/3 (GAUZE/BANDAGES/DRESSINGS) ×2 IMPLANT
CLOTH BEACON ORANGE TIMEOUT ST (SAFETY) ×2 IMPLANT
COVER LIGHT HANDLE STERIS (MISCELLANEOUS) ×4 IMPLANT
DECANTER SPIKE VIAL GLASS SM (MISCELLANEOUS) ×2 IMPLANT
DEVICE TROCAR PUNCTURE CLOSURE (ENDOMECHANICALS) ×2 IMPLANT
DURAPREP 26ML APPLICATOR (WOUND CARE) ×2 IMPLANT
ELECT REM PT RETURN 9FT ADLT (ELECTROSURGICAL) ×2
ELECTRODE REM PT RTRN 9FT ADLT (ELECTROSURGICAL) ×1 IMPLANT
EVACUATOR DRAINAGE 10X20 100CC (DRAIN) ×1 IMPLANT
EVACUATOR SILICONE 100CC (DRAIN) ×1
FILTER SMOKE EVAC LAPAROSHD (FILTER) ×2 IMPLANT
FORMALIN 10 PREFIL 120ML (MISCELLANEOUS) ×2 IMPLANT
GLOVE BIOGEL PI IND STRL 7.0 (GLOVE) ×1 IMPLANT
GLOVE BIOGEL PI IND STRL 7.5 (GLOVE) ×3 IMPLANT
GLOVE BIOGEL PI IND STRL 8 (GLOVE) ×1 IMPLANT
GLOVE BIOGEL PI INDICATOR 7.0 (GLOVE) ×1
GLOVE BIOGEL PI INDICATOR 7.5 (GLOVE) ×3
GLOVE BIOGEL PI INDICATOR 8 (GLOVE) ×1
GLOVE ECLIPSE 6.5 STRL STRAW (GLOVE) ×2 IMPLANT
GLOVE ECLIPSE 7.0 STRL STRAW (GLOVE) ×2 IMPLANT
GLOVE SS BIOGEL STRL SZ 6.5 (GLOVE) ×3 IMPLANT
GLOVE SUPERSENSE BIOGEL SZ 6.5 (GLOVE) ×3
GOWN STRL REIN XL XLG (GOWN DISPOSABLE) ×10 IMPLANT
HEMOSTAT SNOW SURGICEL 2X4 (HEMOSTASIS) ×4 IMPLANT
HEMOSTAT SURGICEL 4X8 (HEMOSTASIS) ×2 IMPLANT
INST SET LAPROSCOPIC AP (KITS) ×2 IMPLANT
IV NS IRRIG 3000ML ARTHROMATIC (IV SOLUTION) ×2 IMPLANT
KIT ROOM TURNOVER APOR (KITS) ×2 IMPLANT
LIGASURE LAP ATLAS 10MM 37CM (INSTRUMENTS) ×2 IMPLANT
MANIFOLD NEPTUNE II (INSTRUMENTS) ×2 IMPLANT
NEEDLE INSUFFLATION 14GA 120MM (NEEDLE) ×2 IMPLANT
NEEDLE INSUFFLATION 14GA 150MM (NEEDLE) ×2 IMPLANT
PACK LAP CHOLE LZT030E (CUSTOM PROCEDURE TRAY) ×2 IMPLANT
PAD ARMBOARD 7.5X6 YLW CONV (MISCELLANEOUS) ×2 IMPLANT
POUCH SPECIMEN RETRIEVAL 10MM (ENDOMECHANICALS) ×2 IMPLANT
SET BASIN LINEN APH (SET/KITS/TRAYS/PACK) ×2 IMPLANT
SET TUBE IRRIG SUCTION NO TIP (IRRIGATION / IRRIGATOR) ×2 IMPLANT
SLEEVE Z-THREAD 5X100MM (TROCAR) ×4 IMPLANT
SPONGE DRAIN TRACH 4X4 STRL 2S (GAUZE/BANDAGES/DRESSINGS) ×2 IMPLANT
SPONGE GAUZE 2X2 8PLY STRL LF (GAUZE/BANDAGES/DRESSINGS) ×8 IMPLANT
STAPLER VISISTAT (STAPLE) ×2 IMPLANT
STRIP CLOSURE SKIN 1/2X4 (GAUZE/BANDAGES/DRESSINGS) IMPLANT
SUT MNCRL AB 4-0 PS2 18 (SUTURE) ×4 IMPLANT
SUT VIC AB 2-0 CT2 27 (SUTURE) ×2 IMPLANT
TAPE CLOTH SURG 4X10 WHT LF (GAUZE/BANDAGES/DRESSINGS) ×2 IMPLANT
TROCAR Z-THRD FIOS HNDL 11X100 (TROCAR) ×2 IMPLANT
TROCAR Z-THREAD FIOS 5X100MM (TROCAR) ×2 IMPLANT
TROCAR Z-THREAD SLEEVE 11X100 (TROCAR) ×2 IMPLANT
WARMER LAPAROSCOPE (MISCELLANEOUS) ×2 IMPLANT

## 2012-04-27 NOTE — Addendum Note (Signed)
Addendum created 04/27/12 1320 by Despina Hidden, CRNA   Modules edited: Anesthesia Medication Administration

## 2012-04-27 NOTE — Progress Notes (Signed)
  Subjective: No acute change.  NO new questions.    Objective: Vital signs in last 24 hours: Temp:  [97.8 F (36.6 C)-99.3 F (37.4 C)] 98.7 F (37.1 C) (03/07 0610) Pulse Rate:  [65-84] 84 (03/07 0610) Resp:  [20] 20 (03/07 0610) BP: (109-118)/(57-72) 110/72 mmHg (03/07 0610) SpO2:  [93 %-100 %] 96 % (03/07 0610) Weight:  [156.4 kg (344 lb 12.8 oz)] 156.4 kg (344 lb 12.8 oz) (03/06 1157) Last BM Date: 04/25/12  Intake/Output from previous day: 03/06 0701 - 03/07 0700 In: 781.3 [I.V.:781.3] Out: 1 [Urine:1] Intake/Output this shift:    General appearance: alert and no distress GI: RUQ pain.  Soft.  No peritoneal signs  Lab Results:   Recent Labs  04/26/12 0539 04/27/12 0525  WBC 9.7 13.5*  HGB 12.5 12.4  HCT 38.7 38.4  PLT 203 168   BMET  Recent Labs  04/26/12 0539 04/27/12 0525  NA 139 136  K 3.4* 4.1  CL 105 102  CO2 25 25  GLUCOSE 112* 127*  BUN 12 10  CREATININE 0.72 0.65  CALCIUM 9.0 8.5   PT/INR No results found for this basename: LABPROT, INR,  in the last 72 hours ABG No results found for this basename: PHART, PCO2, PO2, HCO3,  in the last 72 hours  Studies/Results: US Abdomen Limited Ruq  04/26/2012  *RADIOLOGY REPORT*  Clinical Data:  History of right upper quadrant abdominal pain.  LIMITED ABDOMINAL ULTRASOUND - RIGHT UPPER QUADRANT  Comparison:  None  Findings:  Gallbladder: There is cholelithiasis.  Tiny echogenic foci are seen.  There is some posterior acoustic shadowing.  These are mixed with sludge. There is gallbladder wall thickening. Gallbladder wall thickness measured 6.9 mm.  There is no evidence of pericholecystic fluid. Negative sonographic Murphy's sign according to the ultrasound technologist.  CBD: Dilated in caliber measuring approximately 7 mm. No dilatation of branching intrahepatic bile ducts is evident.  No choledocholithiasis is demonstrated.  Some portions of the common bile duct obscured by bowel gas.  Liver: Upper normal  size and echotexture without focal parenchymal abnormality.  Right Kidney:  No hydronephrosis.  IMPRESSION:  Material was seen within the gallbladder with multiple echogenic foci mixed with sludge.  There appears to be some small amount of posterior acoustic shadowing consistent with cholelithiasis mixed with sludge.The gallbladder is distended. There is thickening of the gallbladder wall which measured  7 mm.  No pericholecystic fluid is evident.  There was no positive ultrasonic Murphy's sign according to technologist.  Abnormal moderate dilatation of common bile duct without evidence of dilatation of branching intrahepatic bile ducts.  No choledocholithiasis was identified.  Some portions were obscured by bowel gas.  Details of the examination were also compromised by body habitus.   Original Report Authenticated By: Onalee Hua Call     Anti-infectives: Anti-infectives   None      Assessment/Plan: s/p Procedure(s): LAPAROSCOPIC CHOLECYSTECTOMY (N/A) Acute cholecystitis.  Will proceed to or today as consented.   LOS: 1 day    Darrel Baroni C 04/27/2012

## 2012-04-27 NOTE — Transfer of Care (Signed)
Immediate Anesthesia Transfer of Care Note  Patient: Kristy Sharp  Procedure(s) Performed: Procedure(s): LAPAROSCOPIC CHOLECYSTECTOMY (N/A)  Patient Location: PACU  Anesthesia Type:General  Level of Consciousness: awake and patient cooperative  Airway & Oxygen Therapy: Patient Spontanous Breathing and Patient connected to face mask oxygen  Post-op Assessment: Report given to PACU RN, Post -op Vital signs reviewed and stable and Patient moving all extremities X 4  Post vital signs: Reviewed and stable  Complications: No apparent anesthesia complications

## 2012-04-27 NOTE — Anesthesia Preprocedure Evaluation (Signed)
Anesthesia Evaluation  Patient identified by MRN, date of birth, ID band Patient awake    Reviewed: Allergy & Precautions, H&P , NPO status , Patient's Chart, lab work & pertinent test results  Airway Mallampati: I TM Distance: >3 FB Neck ROM: Full    Dental  (+) Teeth Intact   Pulmonary asthma ,  breath sounds clear to auscultation        Cardiovascular negative cardio ROS  Rhythm:Regular Rate:Normal     Neuro/Psych PSYCHIATRIC DISORDERS Anxiety Depression    GI/Hepatic   Endo/Other  Morbid obesity  Renal/GU      Musculoskeletal   Abdominal   Peds  Hematology   Anesthesia Other Findings   Reproductive/Obstetrics                           Anesthesia Physical Anesthesia Plan  ASA: III  Anesthesia Plan: General   Post-op Pain Management:    Induction: Intravenous, Rapid sequence and Cricoid pressure planned  Airway Management Planned: Oral ETT  Additional Equipment:   Intra-op Plan:   Post-operative Plan: Extubation in OR  Informed Consent: I have reviewed the patients History and Physical, chart, labs and discussed the procedure including the risks, benefits and alternatives for the proposed anesthesia with the patient or authorized representative who has indicated his/her understanding and acceptance.     Plan Discussed with:   Anesthesia Plan Comments: (Albuterol preop)        Anesthesia Quick Evaluation

## 2012-04-27 NOTE — Op Note (Signed)
Patient:  Kristy Sharp  DOB:  12-06-1965  MRN:  [id number]   Preop Diagnosis:  Acute cholecystitis  Postop Diagnosis:  Gangrenous cholecystitis  Procedure:  Laparoscopic cholecystectomy with intra-abdominal drain placement  Surgeon:  Dr. Tilford Pillar  Anes:  General endotracheal, 0.5% Sensorcaine plain for local  Indications:  Patient is a 47 year old female presented to [facility] with right upper quadrant abdominal pain. Workup and evaluation was consistent for acute cholecystitis. Risks benefits alternatives a laparoscopic possible open cholecystectomy were discussed at length the patient. Risks benefits alternatives including but not limited to risk of bleeding, infection, bile leak, small bowel injury, common bile duct injury, intraoperative cardiac and pulmonary ventral discussed the patient. Her questions and concerns are addressed the patient was consented for the planned procedure.  Procedure note:  Patient is taken to the operating room was placed in supine position the or table time the general anesthetic is a Optician, dispensing. Once patient was asleep symmetrically intubated by the nurse anesthetist. At this point her abdomen is prepped with DuraPrep solution and draped in standard fashion. Stab incision was created supraumbilically with 11 blade scalpel with additional dissection down to subcuticular tissue carried out using a Coker clamp. The clamp was utilized grasp the anterior normal fascia and lift his anteriorly. He did attempt to pass the ears needle through the umbilical trocar site however did not feel comfortable to the center into the peritoneal cavity due to the depth of the fascia. I therefore opted. A stab incision at palmers point in the left subcostal margin with a total blade scalpel. The Veress needle was inserted saline drop test was utilized to confirm intra-abdominal placement and then pneumoperitoneum was initiated. Pneumoperitoneum was obtained as expected and  once adequately insufflated the 11 mm trocar was inserted over laparoscopic line visualization the trocar entering into the peritoneal cavity at the umbilical trocar site. At this point the inner cannulas removed lap scope was reinserted there is no evidence of any trocar or Veress needle placement injury. I was able to visualize the right lateral abdominal wall. There were multiple effusions superiorly consistent with the patient's previous bariatric surgery. Inferiorly the peritoneal cavity was relatively clear. As I was unable to pass additional trochars in the midline I opted to place a 5 mm trocar in the right lateral abdominal wall. This was placed under direct visualization. At this point I began the initial dissection with blunt and sharp Endo Shears dissection. To facilitate intra-lysis, the 5 mm trocar was exchanged for a millimeter trocar in the 10 mm LigaSure was advanced. This was utilized to divide several omental adhesions to the anterior normal wall. With continued dissection I was able to expose the anterior abdominal fascia up to the level of the previously placed peritoneal. Again there is no evidence of any peritoneal placement injury. At this time I was able to place a 5 mm trocar in the epigastric region and a 5 mm in the midline between the 2 trochars under direct visualization with the laparoscope.  The gallbladder fundus was identified. It was constant was decompressed using a Weck needle. The wall of the gallbladder was noted be dark and necrotic in places. The fundus was grasped and lifted anteriorly. To facilitate additional exposure a 5 mm trocar was placed in the midclavicular line. This allowed [place] to retract the omentum and transverse colon to expose the body and infundibulum of the gallbladder. Using a suction irrigator the infundibulum was dissected free exposing the cystic duct and cystic  arteries entering the infundibulum. A window was created by both structures. Fornical is  placed proximally one distally on the cystic duct and cystic duct was divided between 2 most distal clips. Similarly the cystic artery was divided and after ligating with 2 endoclips proximally one distally. I then utilized a letter cautery to dissect the gallbladder free from the gallbladder fossa. Once it was free in the gallbladder was placed into an Endo Catch bag and placed in the right lower quadrant. The surface of the liver was noted be relative to the acute inflammatory process and at this point I optedplace a piece of Surgicel snow as well as a piece of Surgicel into the gallbladder fossa for assistance with hemostasis. There is no active bleeding noted. Due to the amount and inflammation in the gallbladder fossa I did opt to place a 10 flat JP drain into the gallbladder fossa. This was brought through the right lateral trocar site. At this time the gallbladder was retrieved was removed to the umbilical trocar site and intact Endo Catch bag. It was placed in the back table sent as a perm specimen to pathology.  At this time the pneumoperitoneum was evacuated. The remaining trochars were removed. The fascial edges at the umbilical trocar site was reapproximated using a 2-0 Vicryl on a UR needle. The local anesthetic was instilled. The skin edges at the remaining trochars was reapproximated using skin staples. The drain was secured to the skin with a 3-0 nylon. At this point the skin was washed dried moist dry towel. Dressings were placed. The drapes removed the dressings were secured. The patient was allowed to come out of general anesthetic was transferred to PACU in stable condition. At the conclusion of procedure all instrument, sponge, needle counts are correct.  Complications:  None apparent  EBL:  150 ML  Specimen:  Gallbladder

## 2012-04-27 NOTE — Progress Notes (Signed)
UR Chart Review Completed  

## 2012-04-27 NOTE — Anesthesia Postprocedure Evaluation (Signed)
  Anesthesia Post-op Note  Patient: Kristy Sharp  Procedure(s) Performed: Procedure(s): LAPAROSCOPIC CHOLECYSTECTOMY (N/A)  Patient Location: PACU  Anesthesia Type:General  Level of Consciousness: awake, alert , oriented and patient cooperative  Airway and Oxygen Therapy: Patient Spontanous Breathing  Post-op Pain: 3 /10, mild  Post-op Assessment: Post-op Vital signs reviewed, Patient's Cardiovascular Status Stable, Respiratory Function Stable, Patent Airway, No signs of Nausea or vomiting and Pain level controlled  Post-op Vital Signs: Reviewed and stable  Complications: No apparent anesthesia complications

## 2012-04-27 NOTE — Anesthesia Procedure Notes (Signed)
Procedure Name: Intubation Date/Time: 04/27/2012 11:09 AM Performed by: Despina Hidden Pre-anesthesia Checklist: Emergency Drugs available, Suction available, Patient identified and Patient being monitored Patient Re-evaluated:Patient Re-evaluated prior to inductionOxygen Delivery Method: Circle system utilized Preoxygenation: Pre-oxygenation with 100% oxygen Intubation Type: IV induction, Rapid sequence and Cricoid Pressure applied Ventilation: Mask ventilation without difficulty Laryngoscope Size: Mac and 3 Grade View: Grade I Tube type: Oral Tube size: 7.0 mm Number of attempts: 1 Airway Equipment and Method: Stylet Placement Confirmation: ETT inserted through vocal cords under direct vision,  positive ETCO2 and breath sounds checked- equal and bilateral Secured at: 22 cm Tube secured with: Tape Dental Injury: Teeth and Oropharynx as per pre-operative assessment

## 2012-04-28 LAB — BASIC METABOLIC PANEL
CO2: 26 mEq/L (ref 19–32)
GFR calc non Af Amer: >90 mL/min (ref >90)
Glucose, Bld: 127 mg/dL — ABNORMAL HIGH (ref 70–99)
Potassium: 4 mEq/L (ref 3.5–5.1)
Sodium: 131 mEq/L — ABNORMAL LOW (ref 135–145)

## 2012-04-28 LAB — HEMOGLOBIN AND HEMATOCRIT, BLOOD: HCT: 26 % — ABNORMAL LOW (ref 36.0–46.0)

## 2012-04-28 LAB — CBC
Hemoglobin: 9.3 g/dL — ABNORMAL LOW (ref 12.0–15.0)
RBC: 2.95 MIL/uL — ABNORMAL LOW (ref 3.87–5.11)

## 2012-04-28 MED ORDER — PANTOPRAZOLE SODIUM 40 MG PO TBEC
40.0000 mg | DELAYED_RELEASE_TABLET | Freq: Every day | ORAL | Status: DC
  Administered 2012-04-28 – 2012-05-02 (×5): 40 mg via ORAL
  Filled 2012-04-28 (×5): qty 1

## 2012-04-28 NOTE — Progress Notes (Signed)
The patient is receiving Protonix by the intravenous route.  Based on criteria approved by the Pharmacy and Therapeutics Committee and the Medical Executive Committee, the medication is being converted to the equivalent oral dose form.  These criteria include: -No Active GI bleeding -Able to tolerate diet of full liquids (or better) or tube feeding OR able to tolerate other medications by the oral or enteral route  If you have any questions about this conversion, please contact the Pharmacy Department (ext [phone]).  Thank you.  Elson Clan, [facility] 04/28/2012 10:33 AM

## 2012-04-28 NOTE — Progress Notes (Signed)
1 Day Post-Op  Subjective: Pain moderately controlled.  Tolerating Some PO.  No fever or chills.  NO chest pain.    Objective: Vital signs in last 24 hours: Temp:  [97.7 F (36.5 C)-98.5 F (36.9 C)] 97.7 F (36.5 C) (03/08 1229) Pulse Rate:  [86-95] 95 (03/08 1229) Resp:  [16-20] 18 (03/08 1229) BP: (93-103)/(59-64) 93/62 mmHg (03/08 1229) SpO2:  [92 %-95 %] 93 % (03/08 1229) Last BM Date: 04/27/12  Intake/Output from previous day: 03/07 0701 - 03/08 0700 In: 4462 [P.O.:890; I.V.:3422; IV Piggyback:150] Out: 450 [Drains:450] Intake/Output this shift: Total I/O In: 240 [P.O.:240] Out: 90 [Drains:90]  General appearance: alert and no distress Resp: clear to auscultation bilaterally Cardio: regular rate and rhythm GI: Quiet.  Expected tendereness.  Incision c/d/i.  JP serosang.    Lab Results:   Recent Labs  04/27/12 1326 04/28/12 0444 04/28/12 1445  WBC 17.0* 10.2  --   HGB 12.6 9.3* 8.6*  HCT 39.6 28.5* 26.0*  PLT 185 172  --    BMET  Recent Labs  04/27/12 0525 04/28/12 0444  NA 136 131*  K 4.1 4.0  CL 102 98  CO2 25 26  GLUCOSE 127* 127*  BUN 10 11  CREATININE 0.65 0.69  CALCIUM 8.5 8.1*   PT/INR No results found for this basename: LABPROT, INR,  in the last 72 hours ABG No results found for this basename: PHART, PCO2, PO2, HCO3,  in the last 72 hours  Studies/Results: No results found.  Anti-infectives: Anti-infectives   Start     Dose/Rate Route Frequency Ordered Stop   04/27/12 1500  cefOXitin (MEFOXIN) 2 g in dextrose 5 % 50 mL IVPB     2 g 100 mL/hr over 30 Minutes Intravenous 3 times per day 04/27/12 1437     04/27/12 0945  ceFAZolin (ANCEF) 3 g in dextrose 5 % 50 mL IVPB  Status:  Discontinued     3 g 160 mL/hr over 30 Minutes Intravenous On call to O.R. 04/27/12 2956 04/27/12 0937   04/27/12 0945  ceFAZolin (ANCEF) IVPB 1 g/50 mL premix  Status:  Discontinued     1 g 100 mL/hr over 30 Minutes Intravenous On call to O.R. 04/27/12  0939 04/27/12 1437   04/27/12 0945  ceFAZolin (ANCEF) IVPB 2 g/50 mL premix  Status:  Discontinued     2 g 100 mL/hr over 30 Minutes Intravenous On call to O.R. 04/27/12 0939 04/27/12 1437      Assessment/Plan: s/p Procedure(s): LAPAROSCOPIC CHOLECYSTECTOMY (N/A) Advance diet as tolerated.  Some brown discharge from JP but likely surgcell.  Continue to monitor.  Hg down but will recheck later today.  If acute blood loss anemia continues or drops will transfuse.  Low suspicion for on going active bleed but patient is at risk due to severity of cholecystitis and disection.    LOS: 2 days    ZIEGLER,BRENT C 04/28/2012

## 2012-04-29 LAB — CBC
HCT: 26.1 % — ABNORMAL LOW (ref 36.0–46.0)
Hemoglobin: 8.4 g/dL — ABNORMAL LOW (ref 12.0–15.0)
RDW: 14.9 % (ref 11.5–15.5)
WBC: 7.5 10*3/uL (ref 4.0–10.5)

## 2012-04-29 LAB — BASIC METABOLIC PANEL
BUN: 8 mg/dL (ref 6–23)
Chloride: 101 mEq/L (ref 96–112)
GFR calc Af Amer: >90 mL/min (ref >90)
Potassium: 3.6 mEq/L (ref 3.5–5.1)
Sodium: 136 mEq/L (ref 135–145)

## 2012-04-29 MED ORDER — DEXTROSE 5 % IV SOLN
INTRAVENOUS | Status: AC
  Filled 2012-04-29: qty 2

## 2012-04-29 NOTE — Progress Notes (Signed)
2 Days Post-Op  Subjective: Pain about the same. It is controlled with oral pain medication. No nausea. Tolerating liquids. No fevers or chills. Limited ambulation.  Objective: Vital signs in last 24 hours: Temp:  [98.1 F (36.7 C)-98.2 F (36.8 C)] 98.2 F (36.8 C) (03/09 2100) Pulse Rate:  [83-96] 96 (03/09 2100) Resp:  [16-18] 16 (03/09 2100) BP: (92-116)/(56-77) 92/56 mmHg (03/09 2100) SpO2:  [95 %-99 %] 96 % (03/09 2100) Last BM Date: 04/24/12 (Pt is unsure of last BM but states it may have been 3/4.)  Intake/Output from previous day: 03/08 0701 - 03/09 0700 In: 3992 [P.O.:1140; I.V.:2702; IV Piggyback:150] Out: 395 [Urine:100; Drains:295] Intake/Output this shift: Total I/O In: 2170 [P.O.:120; I.V.:1950; IV Piggyback:100] Out: 30 [Drains:30]  General appearance: alert and no distress GI: Positive bowel sounds, soft, obese, expected postoperative tenderness. Incisions are clean dry and intact. JP drain demonstrates minimal serous sanguinous discharge with a slight brown tinged to it.  Lab Results:   Recent Labs  04/28/12 0444 04/28/12 1445 04/29/12 0504  WBC 10.2  --  7.5  HGB 9.3* 8.6* 8.4*  HCT 28.5* 26.0* 26.1*  PLT 172  --  201   BMET  Recent Labs  04/28/12 0444 04/29/12 0504  NA 131* 136  K 4.0 3.6  CL 98 101  CO2 26 29  GLUCOSE 127* 108*  BUN 11 8  CREATININE 0.69 0.63  CALCIUM 8.1* 8.6   PT/INR No results found for this basename: LABPROT, INR,  in the last 72 hours ABG No results found for this basename: PHART, PCO2, PO2, HCO3,  in the last 72 hours  Studies/Results: No results found.  Anti-infectives: Anti-infectives   Start     Dose/Rate Route Frequency Ordered Stop   04/27/12 1500  cefOXitin (MEFOXIN) 2 g in dextrose 5 % 50 mL IVPB     2 g 100 mL/hr over 30 Minutes Intravenous 3 times per day 04/27/12 1437     04/27/12 0945  ceFAZolin (ANCEF) 3 g in dextrose 5 % 50 mL IVPB  Status:  Discontinued     3 g 160 mL/hr over 30 Minutes  Intravenous On call to O.R. 04/27/12 9604 04/27/12 0937   04/27/12 0945  ceFAZolin (ANCEF) IVPB 1 g/50 mL premix  Status:  Discontinued     1 g 100 mL/hr over 30 Minutes Intravenous On call to O.R. 04/27/12 0939 04/27/12 1437   04/27/12 0945  ceFAZolin (ANCEF) IVPB 2 g/50 mL premix  Status:  Discontinued     2 g 100 mL/hr over 30 Minutes Intravenous On call to O.R. 04/27/12 0939 04/27/12 1437      Assessment/Plan: s/p Procedure(s): LAPAROSCOPIC CHOLECYSTECTOMY (N/A) Gangrenous cholecystitis. Continue slow enhancement diet. Increase activity. Low suspicion of a bile leak given the amount of discharge and the transition to a more serous sanguinous appearance. I still feel that the majority of the brown change in the JP drain is likely related to the hemostatic agent with the Surgicel and the gallbladder fossa. Her hemoglobin remained stable and persists in the mid 8 range. I have discussed indications for transfusion with patient. We'll continue to monitor hemoglobin. Acute blood loss anemia. No evidence of ongoing active bleeding.  LOS: 3 days    ZIEGLER,BRENT C 04/29/2012

## 2012-04-30 LAB — PREPARE RBC (CROSSMATCH)

## 2012-04-30 LAB — BASIC METABOLIC PANEL
BUN: 7 mg/dL (ref 6–23)
CO2: 28 mEq/L (ref 19–32)
Chloride: 104 mEq/L (ref 96–112)
GFR calc non Af Amer: >90 mL/min (ref >90)
Glucose, Bld: 102 mg/dL — ABNORMAL HIGH (ref 70–99)
Potassium: 3.5 mEq/L (ref 3.5–5.1)

## 2012-04-30 LAB — CBC
HCT: 23.1 % — ABNORMAL LOW (ref 36.0–46.0)
Hemoglobin: 7.5 g/dL — ABNORMAL LOW (ref 12.0–15.0)
MCH: 31.4 pg (ref 26.0–34.0)
MCHC: 32.5 g/dL (ref 30.0–36.0)
MCV: 96.7 fL (ref 78.0–100.0)
Platelets: 208 K/uL (ref 150–400)
RBC: 2.39 MIL/uL — ABNORMAL LOW (ref 3.87–5.11)
RDW: 15.2 % (ref 11.5–15.5)
WBC: 5.7 K/uL (ref 4.0–10.5)

## 2012-04-30 LAB — ABO/RH: ABO/RH(D): O NEG

## 2012-04-30 MED ORDER — SODIUM CHLORIDE 0.9 % IJ SOLN
3.0000 mL | INTRAMUSCULAR | Status: DC | PRN
  Administered 2012-04-30: 3 mL via INTRAVENOUS

## 2012-04-30 NOTE — Progress Notes (Signed)
3 Days Post-Op  Subjective: Pain improving.  Some fatigue.  No nausea.  Tolerating PO.    Objective: Vital signs in last 24 hours: Temp:  [97.9 F (36.6 C)-99.2 F (37.3 C)] 98 F (36.7 C) (03/10 2145) Pulse Rate:  [77-82] 80 (03/10 2145) Resp:  [14-20] 18 (03/10 2145) BP: (99-122)/(41-69) 107/58 mmHg (03/10 2145) SpO2:  [97 %-98 %] 98 % (03/10 1400) Last BM Date: 04/26/12 (patient states LBM prior to admit)  Intake/Output from previous day: 03/09 0701 - 03/10 0700 In: 3730 [P.O.:840; I.V.:2740; IV Piggyback:150] Out: 80 [Drains:80] Intake/Output this shift: Total I/O In: -  Out: 30 [Drains:30]  General appearance: alert and no distress GI: +BS, soft, expected tenderness.  JP with serosang discharge.  No peritoneal signs.  Lab Results:   Recent Labs  04/29/12 0504 04/30/12 0433  WBC 7.5 5.7  HGB 8.4* 7.5*  HCT 26.1* 23.1*  PLT 201 208   BMET  Recent Labs  04/29/12 0504 04/30/12 0433  NA 136 138  K 3.6 3.5  CL 101 104  CO2 29 28  GLUCOSE 108* 102*  BUN 8 7  CREATININE 0.63 0.61  CALCIUM 8.6 8.3*   PT/INR No results found for this basename: LABPROT, INR,  in the last 72 hours ABG No results found for this basename: PHART, PCO2, PO2, HCO3,  in the last 72 hours  Studies/Results: No results found.  Anti-infectives: Anti-infectives   Start     Dose/Rate Route Frequency Ordered Stop   04/27/12 1500  cefOXitin (MEFOXIN) 2 g in dextrose 5 % 50 mL IVPB     2 g 100 mL/hr over 30 Minutes Intravenous 3 times per day 04/27/12 1437     04/27/12 0945  ceFAZolin (ANCEF) 3 g in dextrose 5 % 50 mL IVPB  Status:  Discontinued     3 g 160 mL/hr over 30 Minutes Intravenous On call to O.R. 04/27/12 1610 04/27/12 0937   04/27/12 0945  ceFAZolin (ANCEF) IVPB 1 g/50 mL premix  Status:  Discontinued     1 g 100 mL/hr over 30 Minutes Intravenous On call to O.R. 04/27/12 0939 04/27/12 1437   04/27/12 0945  ceFAZolin (ANCEF) IVPB 2 g/50 mL premix  Status:  Discontinued      2 g 100 mL/hr over 30 Minutes Intravenous On call to O.R. 04/27/12 0939 04/27/12 1437      Assessment/Plan: s/p Procedure(s): LAPAROSCOPIC CHOLECYSTECTOMY (N/A) Anemia, acute blood loss.  Hg dropped below 8.  Will transfuse 2 units.  Continue abx.  Drainage slowing down.  Continue to monitor Hg.  Hemodynamically stable.    LOS: 4 days    Tawfiq Favila C 04/30/2012

## 2012-04-30 NOTE — Care Management Note (Unsigned)
    Page 1 of 1   04/30/2012     3:30:06 PM   CARE MANAGEMENT NOTE 04/30/2012  Patient:  Kristy Sharp, Kristy Sharp   Account Number:  [id number]  Date Initiated:  04/30/2012  Documentation initiated by:  Sharrie Rothman  Subjective/Objective Assessment:   Pt admitted from home with acute cholecystitis, s/p lap choley. Pt currently lives alone but will have her mother and niece staying with her at discharge for a while. Pt also has a fractured L ankle and has a walker for home use.     Action/Plan:   No CM needs noted. Will continue to follow for any possible HH needs.   Anticipated DC Date:  05/02/2012   Anticipated DC Plan:  HOME/SELF CARE      DC Planning Services  CM consult      Choice offered to / List presented to:             Status of service:  In process, will continue to follow Medicare Important Message given?   (If response is "NO", the following Medicare IM given date fields will be blank) Date Medicare IM given:   Date Additional Medicare IM given:    Discharge Disposition:    Per UR Regulation:    If discussed at Long Length of Stay Meetings, dates discussed:    Comments:  04/30/12 1522 Arlyss Queen, RN BSN CM

## 2012-05-01 ENCOUNTER — Encounter (HOSPITAL_COMMUNITY): Payer: Self-pay | Admitting: General Surgery

## 2012-05-01 LAB — CBC
HCT: 28.9 % — ABNORMAL LOW (ref 36.0–46.0)
Hemoglobin: 9.9 g/dL — ABNORMAL LOW (ref 12.0–15.0)
MCHC: 34.3 g/dL (ref 30.0–36.0)

## 2012-05-01 LAB — BASIC METABOLIC PANEL
BUN: 8 mg/dL (ref 6–23)
CO2: 26 mEq/L (ref 19–32)
GFR calc non Af Amer: >90 mL/min (ref >90)
Glucose, Bld: 97 mg/dL (ref 70–99)
Potassium: 3.6 mEq/L (ref 3.5–5.1)

## 2012-05-01 LAB — TYPE AND SCREEN
Antibody Screen: NEGATIVE
Unit division: 0

## 2012-05-02 LAB — BASIC METABOLIC PANEL
BUN: 8 mg/dL (ref 6–23)
Calcium: 8.3 mg/dL — ABNORMAL LOW (ref 8.4–10.5)
Creatinine, Ser: 0.61 mg/dL (ref 0.50–1.10)
GFR calc non Af Amer: >90 mL/min (ref >90)
Glucose, Bld: 100 mg/dL — ABNORMAL HIGH (ref 70–99)
Sodium: 139 mEq/L (ref 135–145)

## 2012-05-02 LAB — CBC
HCT: 29.5 % — ABNORMAL LOW (ref 36.0–46.0)
Hemoglobin: 9.9 g/dL — ABNORMAL LOW (ref 12.0–15.0)
MCH: 30.9 pg (ref 26.0–34.0)
MCHC: 33.6 g/dL (ref 30.0–36.0)
MCV: 92.2 fL (ref 78.0–100.0)

## 2012-05-02 MED ORDER — AMOXICILLIN-POT CLAVULANATE 500-125 MG PO TABS: 1.0000 | ORAL_TABLET | Freq: Three times a day (TID) | ORAL | Status: DC

## 2012-05-02 MED ORDER — HYDROCODONE-ACETAMINOPHEN 5-325 MG PO TABS: 1.0000 | ORAL_TABLET | ORAL | Status: DC | PRN

## 2012-05-02 NOTE — Progress Notes (Signed)
Dc instructions reviewed with patient.  Verbalized understanding.  Pt dc'd to home with family.  Schonewitz, Candelaria Stagers 05/02/2012

## 2012-05-10 NOTE — ED Provider Notes (Signed)
History     CSN: [id number]  Arrival date & time 04/26/12  0519   First MD Initiated Contact with Patient 04/26/12 0534      Chief Complaint  Patient presents with  . Abdominal Pain    (Consider location/radiation/quality/duration/timing/severity/associated sxs/prior treatment) HPI Kristy Sharp is a 47 y.o. female who presents to the Emergency Department complaining of RUQ pain that began last night around 10 PM and lasted until midnight. She went to work at 4 AM and it became worse. Now RUQ pain with radiation to R flank is constant, associated with nausea. Denies fever, chills, vomiting, diarrhea. She last ate at 7 PM.  Past Medical History  Diagnosis Date  . Anemia   . Depression   . Anxiety   . Asthma     Past Surgical History  Procedure Laterality Date  . Gastric bypass    . Orthopedic surgery    . Eye surgery    . Cholecystectomy N/A 04/27/2012    Procedure: LAPAROSCOPIC CHOLECYSTECTOMY;  Surgeon: Fabio Bering, MD;  Location: [facility];  Service: General;  Laterality: N/A;    History reviewed. No pertinent family history.  History  Substance Use Topics  . Smoking status: Never Smoker   . Smokeless tobacco: Not on file  . Alcohol Use: Yes    OB History   Grav Para Term Preterm Abortions TAB SAB Ect Mult Living                  Review of Systems  Constitutional: Negative for fever.       10 Systems reviewed and are negative for acute change except as noted in the HPI.  HENT: Negative for congestion.   Eyes: Negative for discharge and redness.  Respiratory: Negative for cough and shortness of breath.   Cardiovascular: Negative for chest pain.  Gastrointestinal: Negative for vomiting and abdominal pain.       RUQ pain  Musculoskeletal: Negative for back pain.  Skin: Negative for rash.  Neurological: Negative for syncope, numbness and headaches.  Psychiatric/Behavioral:       No behavior change.    Allergies  Review of patient's allergies indicates no  known allergies.  Home Medications   Current Outpatient Rx  Name  Route  Sig  Dispense  Refill  . albuterol (PROVENTIL HFA;VENTOLIN HFA) 108 (90 BASE) MCG/ACT inhaler   Inhalation   Inhale 2 puffs into the lungs every 6 (six) hours as needed for wheezing.         Marland Kitchen buPROPion (WELLBUTRIN) 100 MG tablet   Oral   Take 100 mg by mouth 2 (two) times daily.         . citalopram (CELEXA) 40 MG tablet   Oral   Take 60 mg by mouth daily.         . cyanocobalamin (,VITAMIN B-12,) 1000 MCG/ML injection   Intramuscular   Inject 1,000 mcg into the muscle every 30 (thirty) days.         . diphenhydrAMINE (BENADRYL) 25 mg capsule   Oral   Take 25 mg by mouth every 6 (six) hours as needed for itching.         . ferrous sulfate 325 (65 FE) MG tablet   Oral   Take 325 mg by mouth daily with breakfast.         . ibuprofen (ADVIL,MOTRIN) 200 MG tablet   Oral   Take by mouth every 6 (six) hours as needed for pain.         Marland Kitchen  LORazepam (ATIVAN) 0.5 MG tablet   Oral   Take 0.5 mg by mouth at bedtime.         . montelukast (SINGULAIR) 10 MG tablet   Oral   Take 10 mg by mouth at bedtime.         . traZODone (DESYREL) 50 MG tablet   Oral   Take 100 mg by mouth at bedtime as needed for sleep.         Marland Kitchen triamcinolone cream (KENALOG) 0.1 %   Topical   Apply topically 2 (two) times daily.         . Vitamin D, Ergocalciferol, (DRISDOL) 50000 UNITS CAPS   Oral   Take 50,000 Units by mouth every 7 (seven) days.         Marland Kitchen amoxicillin-clavulanate (AUGMENTIN) 500-125 MG per tablet   Oral   Take 1 tablet (500 mg total) by mouth 3 (three) times daily.   15 tablet   0   . HYDROcodone-acetaminophen (NORCO/VICODIN) 5-325 MG per tablet   Oral   Take 1-2 tablets by mouth every 4 (four) hours as needed.   45 tablet   0     BP 112/61  Pulse 75  Temp(Src) 98 F (36.7 C) (Oral)  Resp 20  Ht 5\' 6"  (1.676 m)  Wt 344 lb (156.037 kg)  BMI 55.55 kg/m2  SpO2 99%  LMP  04/12/2012  Physical Exam  Nursing note and vitals reviewed. Constitutional:  Awake, alert, nontoxic appearance.  HENT:  Head: Atraumatic.  Eyes: Right eye exhibits no discharge. Left eye exhibits no discharge.  Neck: Neck supple.  Pulmonary/Chest: Effort normal. She exhibits no tenderness.  Abdominal: Soft. There is no tenderness. There is no rebound.  RUQ pain with palpation.  Musculoskeletal: She exhibits no tenderness.  Baseline ROM, no obvious new focal weakness.  Neurological:  Mental status and motor strength appears baseline for patient and situation.  Skin: No rash noted.  Psychiatric: She has a normal mood and affect.    ED Course  Procedures (including critical care time)  Results for orders placed during the hospital encounter of 04/26/12  SURGICAL PCR SCREEN      Result Value Range   MRSA, PCR NEGATIVE  NEGATIVE   Staphylococcus aureus NEGATIVE  NEGATIVE  CBC WITH DIFFERENTIAL      Result Value Range   WBC 9.7  4.0 - 10.5 K/uL   RBC 4.02  3.87 - 5.11 MIL/uL   Hemoglobin 12.5  12.0 - 15.0 g/dL   HCT 16.1  09.6 - 04.5 %   MCV 96.3  78.0 - 100.0 fL   MCH 31.1  26.0 - 34.0 pg   MCHC 32.3  30.0 - 36.0 g/dL   RDW 40.9  81.1 - 91.4 %   Platelets 203  150 - 400 K/uL   Neutrophils Relative 76  43 - 77 %   Neutro Abs 7.3  1.7 - 7.7 K/uL   Lymphocytes Relative 12  12 - 46 %   Lymphs Abs 1.2  0.7 - 4.0 K/uL   Monocytes Relative 10  3 - 12 %   Monocytes Absolute 0.9  0.1 - 1.0 K/uL   Eosinophils Relative 2  0 - 5 %   Eosinophils Absolute 0.2  0.0 - 0.7 K/uL   Basophils Relative 0  0 - 1 %   Basophils Absolute 0.0  0.0 - 0.1 K/uL  COMPREHENSIVE METABOLIC PANEL      Result Value Range   Sodium 139  135 - 145 mEq/L   Potassium 3.4 (*) 3.5 - 5.1 mEq/L   Chloride 105  96 - 112 mEq/L   CO2 25  19 - 32 mEq/L   Glucose, Bld 112 (*) 70 - 99 mg/dL   BUN 12  6 - 23 mg/dL   Creatinine, Ser 4.09  0.50 - 1.10 mg/dL   Calcium 9.0  8.4 - 81.1 mg/dL   Total Protein 6.6  6.0  - 8.3 g/dL   Albumin 3.3 (*) 3.5 - 5.2 g/dL   AST 14  0 - 37 U/L   ALT 9  0 - 35 U/L   Alkaline Phosphatase 54  39 - 117 U/L   Total Bilirubin 0.3  0.3 - 1.2 mg/dL   GFR calc non Af Amer >90  >90 mL/min   GFR calc Af Amer >90  >90 mL/min  AMYLASE      Result Value Range   Amylase 33  0 - 105 U/L  URINALYSIS, ROUTINE W REFLEX MICROSCOPIC      Result Value Range   Color, Urine YELLOW  YELLOW   APPearance CLEAR  CLEAR   Specific Gravity, Urine 1.015  1.005 - 1.030   pH >9.0 (*) 5.0 - 8.0   Glucose, UA NEGATIVE  NEGATIVE mg/dL   Hgb urine dipstick NEGATIVE  NEGATIVE   Bilirubin Urine NEGATIVE  NEGATIVE   Ketones, ur 15 (*) NEGATIVE mg/dL   Protein, ur 30 (*) NEGATIVE mg/dL   Urobilinogen, UA 0.2  0.0 - 1.0 mg/dL   Nitrite NEGATIVE  NEGATIVE   Leukocytes, UA NEGATIVE  NEGATIVE  URINE MICROSCOPIC-ADD ON      Result Value Range   Squamous Epithelial / LPF FEW (*) RARE   RBC / HPF 0-2  <3 RBC/hpf  BASIC METABOLIC PANEL      Result Value Range   Sodium 136  135 - 145 mEq/L   Potassium 4.1  3.5 - 5.1 mEq/L   Chloride 102  96 - 112 mEq/L   CO2 25  19 - 32 mEq/L   Glucose, Bld 127 (*) 70 - 99 mg/dL   BUN 10  6 - 23 mg/dL   Creatinine, Ser 9.14  0.50 - 1.10 mg/dL   Calcium 8.5  8.4 - 78.2 mg/dL   GFR calc non Af Amer >90  >90 mL/min   GFR calc Af Amer >90  >90 mL/min  CBC      Result Value Range   WBC 13.5 (*) 4.0 - 10.5 K/uL   RBC 3.91  3.87 - 5.11 MIL/uL   Hemoglobin 12.4  12.0 - 15.0 g/dL   HCT 95.6  21.3 - 08.6 %   MCV 98.2  78.0 - 100.0 fL   MCH 31.7  26.0 - 34.0 pg   MCHC 32.3  30.0 - 36.0 g/dL   RDW 57.8  46.9 - 62.9 %   Platelets 168  150 - 400 K/uL  CBC      Result Value Range   WBC 17.0 (*) 4.0 - 10.5 K/uL   RBC 4.05  3.87 - 5.11 MIL/uL   Hemoglobin 12.6  12.0 - 15.0 g/dL   HCT 52.8  41.3 - 24.4 %   MCV 97.8  78.0 - 100.0 fL   MCH 31.1  26.0 - 34.0 pg   MCHC 31.8  30.0 - 36.0 g/dL   RDW 01.0  27.2 - 53.6 %   Platelets 185  150 - 400 K/uL  CBC  Result  Value Range   WBC 10.2  4.0 - 10.5 K/uL   RBC 2.95 (*) 3.87 - 5.11 MIL/uL   Hemoglobin 9.3 (*) 12.0 - 15.0 g/dL   HCT 14.7 (*) 82.9 - 56.2 %   MCV 96.6  78.0 - 100.0 fL   MCH 31.5  26.0 - 34.0 pg   MCHC 32.6  30.0 - 36.0 g/dL   RDW 13.0  86.5 - 78.4 %   Platelets 172  150 - 400 K/uL  BASIC METABOLIC PANEL      Result Value Range   Sodium 131 (*) 135 - 145 mEq/L   Potassium 4.0  3.5 - 5.1 mEq/L   Chloride 98  96 - 112 mEq/L   CO2 26  19 - 32 mEq/L   Glucose, Bld 127 (*) 70 - 99 mg/dL   BUN 11  6 - 23 mg/dL   Creatinine, Ser 6.96  0.50 - 1.10 mg/dL   Calcium 8.1 (*) 8.4 - 10.5 mg/dL   GFR calc non Af Amer >90  >90 mL/min   GFR calc Af Amer >90  >90 mL/min  HEMOGLOBIN AND HEMATOCRIT, BLOOD      Result Value Range   Hemoglobin 8.6 (*) 12.0 - 15.0 g/dL   HCT 29.5 (*) 28.4 - 13.2 %  CBC      Result Value Range   WBC 7.5  4.0 - 10.5 K/uL   RBC 2.69 (*) 3.87 - 5.11 MIL/uL   Hemoglobin 8.4 (*) 12.0 - 15.0 g/dL   HCT 44.0 (*) 10.2 - 72.5 %   MCV 97.0  78.0 - 100.0 fL   MCH 31.2  26.0 - 34.0 pg   MCHC 32.2  30.0 - 36.0 g/dL   RDW 36.6  44.0 - 34.7 %   Platelets 201  150 - 400 K/uL  BASIC METABOLIC PANEL      Result Value Range   Sodium 136  135 - 145 mEq/L   Potassium 3.6  3.5 - 5.1 mEq/L   Chloride 101  96 - 112 mEq/L   CO2 29  19 - 32 mEq/L   Glucose, Bld 108 (*) 70 - 99 mg/dL   BUN 8  6 - 23 mg/dL   Creatinine, Ser 4.25  0.50 - 1.10 mg/dL   Calcium 8.6  8.4 - 95.6 mg/dL   GFR calc non Af Amer >90  >90 mL/min   GFR calc Af Amer >90  >90 mL/min  CBC      Result Value Range   WBC 5.7  4.0 - 10.5 K/uL   RBC 2.39 (*) 3.87 - 5.11 MIL/uL   Hemoglobin 7.5 (*) 12.0 - 15.0 g/dL   HCT 38.7 (*) 56.4 - 33.2 %   MCV 96.7  78.0 - 100.0 fL   MCH 31.4  26.0 - 34.0 pg   MCHC 32.5  30.0 - 36.0 g/dL   RDW 95.1  88.4 - 16.6 %   Platelets 208  150 - 400 K/uL  BASIC METABOLIC PANEL      Result Value Range   Sodium 138  135 - 145 mEq/L   Potassium 3.5  3.5 - 5.1 mEq/L   Chloride 104   96 - 112 mEq/L   CO2 28  19 - 32 mEq/L   Glucose, Bld 102 (*) 70 - 99 mg/dL   BUN 7  6 - 23 mg/dL   Creatinine, Ser 0.63  0.50 - 1.10 mg/dL   Calcium 8.3 (*) 8.4 - 10.5 mg/dL  GFR calc non Af Amer >90  >90 mL/min   GFR calc Af Amer >90  >90 mL/min  CBC      Result Value Range   WBC 7.4  4.0 - 10.5 K/uL   RBC 3.11 (*) 3.87 - 5.11 MIL/uL   Hemoglobin 9.9 (*) 12.0 - 15.0 g/dL   HCT 56.2 (*) 13.0 - 86.5 %   MCV 92.9  78.0 - 100.0 fL   MCH 31.8  26.0 - 34.0 pg   MCHC 34.3  30.0 - 36.0 g/dL   RDW 78.4 (*) 69.6 - 29.5 %   Platelets 233  150 - 400 K/uL  BASIC METABOLIC PANEL      Result Value Range   Sodium 140  135 - 145 mEq/L   Potassium 3.6  3.5 - 5.1 mEq/L   Chloride 105  96 - 112 mEq/L   CO2 26  19 - 32 mEq/L   Glucose, Bld 97  70 - 99 mg/dL   BUN 8  6 - 23 mg/dL   Creatinine, Ser 2.84  0.50 - 1.10 mg/dL   Calcium 8.3 (*) 8.4 - 10.5 mg/dL   GFR calc non Af Amer >90  >90 mL/min   GFR calc Af Amer >90  >90 mL/min  CBC      Result Value Range   WBC 7.6  4.0 - 10.5 K/uL   RBC 3.20 (*) 3.87 - 5.11 MIL/uL   Hemoglobin 9.9 (*) 12.0 - 15.0 g/dL   HCT 13.2 (*) 44.0 - 10.2 %   MCV 92.2  78.0 - 100.0 fL   MCH 30.9  26.0 - 34.0 pg   MCHC 33.6  30.0 - 36.0 g/dL   RDW 72.5 (*) 36.6 - 44.0 %   Platelets 259  150 - 400 K/uL  BASIC METABOLIC PANEL      Result Value Range   Sodium 139  135 - 145 mEq/L   Potassium 3.5  3.5 - 5.1 mEq/L   Chloride 107  96 - 112 mEq/L   CO2 24  19 - 32 mEq/L   Glucose, Bld 100 (*) 70 - 99 mg/dL   BUN 8  6 - 23 mg/dL   Creatinine, Ser 3.47  0.50 - 1.10 mg/dL   Calcium 8.3 (*) 8.4 - 10.5 mg/dL   GFR calc non Af Amer >90  >90 mL/min   GFR calc Af Amer >90  >90 mL/min  TYPE AND SCREEN      Result Value Range   ABO/RH(D) O NEG     Antibody Screen NEG     Sample Expiration 05/03/2012     Unit Number [id number]     Blood Component Type RED CELLS,LR     Unit division 00     Status of Unit ISSUED,FINAL     Transfusion Status OK TO TRANSFUSE      Crossmatch Result Compatible     Unit Number [id number]     Blood Component Type RED CELLS,LR     Unit division 00     Status of Unit ISSUED,FINAL     Transfusion Status OK TO TRANSFUSE     Crossmatch Result Compatible    PREPARE RBC (CROSSMATCH)      Result Value Range   Order Confirmation ORDER PROCESSED BY BLOOD BANK    ABO/RH      Result Value Range   ABO/RH(D) O NEG     No results found.       MDM  Patient with RUQ pain  with radiation to the R flank. Given IVF, morphine, zofran. Scheduled for [place] this AN. Labs are unremarkable.         Nicoletta Dress. Colon Branch, MD 05/10/12 [phone]

## 2012-05-14 NOTE — Discharge Summary (Signed)
Physician Discharge Summary  Patient ID: Kristy Sharp MRN: [id number] DOB/AGE: December 25, 1965 47 y.o.  Admit date: 04/26/2012 Discharge date: 05/02/2012  Admission Diagnoses:Acute cholecystitis  Discharge Diagnoses: Gangrenous cholecystitis Active Problems:   * No active hospital problems. *   Discharged Condition: stable  Hospital Course: Patient presented with RUQ pain.  Evaluation was consistent for acute cholecystitis.  She was taken to the or.  Noted at that time she had gangrenous cholecystitis.  Tolerated Laparoscopic cholecystectomy.  Slow recovery due to pain control.  She was advanced on a diet.  Tolerated well.  Patient was discharged with drain in place.    Consults: None  Significant Diagnostic Studies: labs: daily  Treatments: surgery: laparoscopic cholecystectomy  Discharge Exam: Blood pressure 112/61, pulse 75, temperature 98 F (36.7 C), temperature source Oral, resp. rate 20, height 5\' 6"  (1.676 m), weight 156.037 kg (344 lb), last menstrual period 04/12/2012, SpO2 99.00%. General appearance: alert and no distress Resp: clear to auscultation bilaterally Cardio: regular rate and rhythm GI: +BS, soft, expected tenderness.  Drain with serosangenous discharge.    Disposition: 01-Home or Self Care  Discharge Orders   Future Orders Complete By Expires     Diet - low sodium heart healthy  As directed     Discharge instructions  As directed     Comments:      Increase activity as tolerated. May place ice pack for comfort.  Alternate an anti-inflammatory such as ibuprofen (Motrin, Advil) 400-600mg  every 6 hours with the prescribed pain medication.   Do not take any additional acetaminophen as there is Tylenol in the pain medication.    Discharge wound care:  As directed     Comments:      Clean surgical sites with soap and water.  May shower the morning after surgery unless instructed by Dr. Leticia Penna otherwise.  No soaking for 2-3 weeks.    If adhesive strips are in  place, they may be removed in 1-2 weeks while in the shower.   Drain care as discussed    Driving Restrictions  As directed     Comments:      No driving while on pain medications.    Increase activity slowly  As directed     Lifting restrictions  As directed     Comments:      No lifting over 20lbs for 4-5 weeks post-op.        Medication List    TAKE these medications       albuterol 108 (90 BASE) MCG/ACT inhaler  Commonly known as:  PROVENTIL HFA;VENTOLIN HFA  Inhale 2 puffs into the lungs every 6 (six) hours as needed for wheezing.     amoxicillin-clavulanate 500-125 MG per tablet  Commonly known as:  AUGMENTIN  Take 1 tablet (500 mg total) by mouth 3 (three) times daily.     buPROPion 100 MG tablet  Commonly known as:  WELLBUTRIN  Take 100 mg by mouth 2 (two) times daily.     citalopram 40 MG tablet  Commonly known as:  CELEXA  Take 60 mg by mouth daily.     cyanocobalamin 1000 MCG/ML injection  Commonly known as:  (VITAMIN B-12)  Inject 1,000 mcg into the muscle every 30 (thirty) days.     diphenhydrAMINE 25 mg capsule  Commonly known as:  BENADRYL  Take 25 mg by mouth every 6 (six) hours as needed for itching.     ferrous sulfate 325 (65 FE) MG tablet  Take 325 mg  by mouth daily with breakfast.     HYDROcodone-acetaminophen 5-325 MG per tablet  Commonly known as:  NORCO/VICODIN  Take 1-2 tablets by mouth every 4 (four) hours as needed.     ibuprofen 200 MG tablet  Commonly known as:  ADVIL,MOTRIN  Take by mouth every 6 (six) hours as needed for pain.     LORazepam 0.5 MG tablet  Commonly known as:  ATIVAN  Take 0.5 mg by mouth at bedtime.     montelukast 10 MG tablet  Commonly known as:  SINGULAIR  Take 10 mg by mouth at bedtime.     traZODone 50 MG tablet  Commonly known as:  DESYREL  Take 100 mg by mouth at bedtime as needed for sleep.     triamcinolone cream 0.1 %  Commonly known as:  KENALOG  Apply topically 2 (two) times daily.      Vitamin D (Ergocalciferol) 50000 UNITS Caps  Commonly known as:  DRISDOL  Take 50,000 Units by mouth every 7 (seven) days.         SignedFabio Bering 05/14/2012, 9:53 PM

## 2012-05-19 ENCOUNTER — Emergency Department (HOSPITAL_COMMUNITY)
Admission: EM | Admit: 2012-05-19 | Discharge: 2012-05-20 | Disposition: A | Discharge status: Home / Self Care | Payer: BC Managed Care – PPO | Attending: Emergency Medicine | Admitting: Emergency Medicine

## 2012-05-19 ENCOUNTER — Emergency Department (HOSPITAL_COMMUNITY): Payer: BC Managed Care – PPO

## 2012-05-19 ENCOUNTER — Encounter (HOSPITAL_COMMUNITY): Payer: Self-pay | Admitting: *Deleted

## 2012-05-19 DIAGNOSIS — R5381 Other malaise: Secondary | ICD-10-CM | POA: Insufficient documentation

## 2012-05-19 DIAGNOSIS — R109 Unspecified abdominal pain: Secondary | ICD-10-CM | POA: Insufficient documentation

## 2012-05-19 DIAGNOSIS — R509 Fever, unspecified: Secondary | ICD-10-CM | POA: Insufficient documentation

## 2012-05-19 DIAGNOSIS — D649 Anemia, unspecified: Secondary | ICD-10-CM | POA: Insufficient documentation

## 2012-05-19 DIAGNOSIS — Z79899 Other long term (current) drug therapy: Secondary | ICD-10-CM | POA: Insufficient documentation

## 2012-05-19 DIAGNOSIS — F329 Major depressive disorder, single episode, unspecified: Secondary | ICD-10-CM | POA: Insufficient documentation

## 2012-05-19 DIAGNOSIS — R197 Diarrhea, unspecified: Secondary | ICD-10-CM | POA: Insufficient documentation

## 2012-05-19 DIAGNOSIS — F411 Generalized anxiety disorder: Secondary | ICD-10-CM | POA: Insufficient documentation

## 2012-05-19 DIAGNOSIS — F3289 Other specified depressive episodes: Secondary | ICD-10-CM | POA: Insufficient documentation

## 2012-05-19 DIAGNOSIS — R110 Nausea: Secondary | ICD-10-CM | POA: Insufficient documentation

## 2012-05-19 DIAGNOSIS — J45909 Unspecified asthma, uncomplicated: Secondary | ICD-10-CM | POA: Insufficient documentation

## 2012-05-19 DIAGNOSIS — R630 Anorexia: Secondary | ICD-10-CM | POA: Insufficient documentation

## 2012-05-19 DIAGNOSIS — E669 Obesity, unspecified: Secondary | ICD-10-CM | POA: Insufficient documentation

## 2012-05-19 LAB — URINALYSIS, ROUTINE W REFLEX MICROSCOPIC
Bilirubin Urine: NEGATIVE
Nitrite: NEGATIVE
Specific Gravity, Urine: <1.005 — ABNORMAL LOW (ref 1.005–1.030)
Urobilinogen, UA: 0.2 mg/dL (ref 0.0–1.0)
pH: 6.5 (ref 5.0–8.0)

## 2012-05-19 LAB — COMPREHENSIVE METABOLIC PANEL
ALT: 21 U/L (ref 0–35)
Albumin: 2.6 g/dL — ABNORMAL LOW (ref 3.5–5.2)
Alkaline Phosphatase: 142 U/L — ABNORMAL HIGH (ref 39–117)
BUN: 11 mg/dL (ref 6–23)
Calcium: 8.7 mg/dL (ref 8.4–10.5)
GFR calc Af Amer: >90 mL/min (ref >90)
Potassium: 3 mEq/L — ABNORMAL LOW (ref 3.5–5.1)
Sodium: 134 mEq/L — ABNORMAL LOW (ref 135–145)
Total Protein: 6.6 g/dL (ref 6.0–8.3)

## 2012-05-19 LAB — CBC WITH DIFFERENTIAL/PLATELET
Basophils Relative: 0 % (ref 0–1)
Eosinophils Absolute: 0.1 10*3/uL (ref 0.0–0.7)
Eosinophils Relative: 2 % (ref 0–5)
MCH: 30.9 pg (ref 26.0–34.0)
MCHC: 32.7 g/dL (ref 30.0–36.0)
MCV: 94.5 fL (ref 78.0–100.0)
Neutrophils Relative %: 71 % (ref 43–77)
Platelets: 322 10*3/uL (ref 150–400)

## 2012-05-19 LAB — URINE MICROSCOPIC-ADD ON

## 2012-05-19 LAB — LIPASE, BLOOD: Lipase: 37 U/L (ref 11–59)

## 2012-05-19 MED ORDER — HYDROCODONE-ACETAMINOPHEN 5-325 MG PO TABS: 1.0000 | ORAL_TABLET | ORAL | Status: DC | PRN

## 2012-05-19 MED ORDER — HYDROMORPHONE HCL PF 1 MG/ML IJ SOLN
1.0000 mg | Freq: Once | INTRAMUSCULAR | Status: AC
  Administered 2012-05-19: 1 mg via INTRAVENOUS
  Filled 2012-05-19: qty 1

## 2012-05-19 MED ORDER — IOHEXOL 300 MG/ML  SOLN
100.0000 mL | Freq: Once | INTRAMUSCULAR | Status: AC | PRN
  Administered 2012-05-19: 100 mL via INTRAVENOUS

## 2012-05-19 MED ORDER — SODIUM CHLORIDE 0.9 % IV BOLUS (SEPSIS)
1000.0000 mL | Freq: Once | INTRAVENOUS | Status: AC
  Administered 2012-05-19: 1000 mL via INTRAVENOUS

## 2012-05-19 MED ORDER — IOHEXOL 300 MG/ML  SOLN
50.0000 mL | Freq: Once | INTRAMUSCULAR | Status: AC | PRN
  Administered 2012-05-19: 50 mL via ORAL

## 2012-05-19 MED ORDER — ONDANSETRON HCL 4 MG/2ML IJ SOLN
4.0000 mg | Freq: Once | INTRAMUSCULAR | Status: AC
  Administered 2012-05-19: 4 mg via INTRAVENOUS
  Filled 2012-05-19: qty 2

## 2012-05-19 NOTE — ED Notes (Signed)
Pt was sent here by Dr. Lovell Sheehan. Pt had a drain removed on Thursday and ever since, pt has been weak, having abdominal pain, diarrhea, fever, nausea, and decreased appetite.

## 2012-05-19 NOTE — ED Notes (Signed)
Drinking PO contrast.

## 2012-05-19 NOTE — Consult Note (Signed)
Reason for Consult: Postoperative fever and abdominal pain Referring Physician: Emergency room  Kristy Sharp is an 47 y.o. female.  HPI: Patient is a 47 year old obese white female status post laparoscopic cholecystectomy for gangrenous cholecystitis on 04/27/2012 by Dr. Tilford Pillar who presented emergency room with worsening upper abdominal pain and a fever to 100. She had seen Dr. Leticia Sharp 2 days ago and had a JP drain removed. She states she hasn't felt well since then. Her appetite is decreased. She denies any vomiting.  Past Medical History  Diagnosis Date  . Anemia   . Depression   . Anxiety   . Asthma     Past Surgical History  Procedure Laterality Date  . Gastric bypass    . Orthopedic surgery    . Eye surgery    . Cholecystectomy N/A 04/27/2012    Procedure: LAPAROSCOPIC CHOLECYSTECTOMY;  Surgeon: Fabio Bering, MD;  Location: [facility];  Service: General;  Laterality: N/A;    History reviewed. No pertinent family history.  Social History:  reports that she has never smoked. She does not have any smokeless tobacco history on file. She reports that  drinks alcohol. She reports that she does not use illicit drugs.  Allergies: No Known Allergies  Medications: I have reviewed the patient's current medications.  Results for orders placed during the hospital encounter of 05/19/12 (from the past 48 hour(s))  CBC WITH DIFFERENTIAL     Status: Abnormal   Collection Time    05/19/12  9:15 PM      Result Value Range   WBC 8.4  4.0 - 10.5 K/uL   RBC 3.63 (*) 3.87 - 5.11 MIL/uL   Hemoglobin 11.2 (*) 12.0 - 15.0 g/dL   HCT 16.1 (*) 09.6 - 04.5 %   MCV 94.5  78.0 - 100.0 fL   MCH 30.9  26.0 - 34.0 pg   MCHC 32.7  30.0 - 36.0 g/dL   RDW 40.9 (*) 81.1 - 91.4 %   Platelets 322  150 - 400 K/uL   Neutrophils Relative 71  43 - 77 %   Neutro Abs 6.0  1.7 - 7.7 K/uL   Lymphocytes Relative 15  12 - 46 %   Lymphs Abs 1.3  0.7 - 4.0 K/uL   Monocytes Relative 12  3 - 12 %   Monocytes  Absolute 1.0  0.1 - 1.0 K/uL   Eosinophils Relative 2  0 - 5 %   Eosinophils Absolute 0.1  0.0 - 0.7 K/uL   Basophils Relative 0  0 - 1 %   Basophils Absolute 0.0  0.0 - 0.1 K/uL  COMPREHENSIVE METABOLIC PANEL     Status: Abnormal   Collection Time    05/19/12  9:15 PM      Result Value Range   Sodium 134 (*) 135 - 145 mEq/L   Potassium 3.0 (*) 3.5 - 5.1 mEq/L   Chloride 99  96 - 112 mEq/L   CO2 24  19 - 32 mEq/L   Glucose, Bld 106 (*) 70 - 99 mg/dL   BUN 11  6 - 23 mg/dL   Creatinine, Ser 7.82  0.50 - 1.10 mg/dL   Calcium 8.7  8.4 - 95.6 mg/dL   Total Protein 6.6  6.0 - 8.3 g/dL   Albumin 2.6 (*) 3.5 - 5.2 g/dL   AST 25  0 - 37 U/L   ALT 21  0 - 35 U/L   Alkaline Phosphatase 142 (*) 39 - 117 U/L  Total Bilirubin 0.4  0.3 - 1.2 mg/dL   GFR calc non Af Amer >90  >90 mL/min   GFR calc Af Amer >90  >90 mL/min   Comment:            The eGFR has been calculated     using the CKD EPI equation.     This calculation has not been     validated in all clinical     situations.     eGFR's persistently     <90 mL/min signify     possible Chronic Kidney Disease.  LIPASE, BLOOD     Status: None   Collection Time    05/19/12  9:15 PM      Result Value Range   Lipase 37  11 - 59 U/L  URINALYSIS, ROUTINE W REFLEX MICROSCOPIC     Status: Abnormal   Collection Time    05/19/12 11:07 PM      Result Value Range   Color, Urine YELLOW  YELLOW   APPearance CLEAR  CLEAR   Specific Gravity, Urine <1.005 (*) 1.005 - 1.030   pH 6.5  5.0 - 8.0   Glucose, UA NEGATIVE  NEGATIVE mg/dL   Hgb urine dipstick SMALL (*) NEGATIVE   Bilirubin Urine NEGATIVE  NEGATIVE   Ketones, ur NEGATIVE  NEGATIVE mg/dL   Protein, ur NEGATIVE  NEGATIVE mg/dL   Urobilinogen, UA 0.2  0.0 - 1.0 mg/dL   Nitrite NEGATIVE  NEGATIVE   Leukocytes, UA TRACE (*) NEGATIVE  URINE MICROSCOPIC-ADD ON     Status: Abnormal   Collection Time    05/19/12 11:07 PM      Result Value Range   Squamous Epithelial / LPF FEW (*) RARE    WBC, UA 3-6  <3 WBC/hpf   RBC / HPF 3-6  <3 RBC/hpf    Ct Abdomen Pelvis W Contrast  05/19/2012  *RADIOLOGY REPORT*  Clinical Data: Right lower quadrant pain, recent gallbladder surgery with JP drain removed 2 days ago, history asthma, anemia, gastric bypass surgery  CT ABDOMEN AND PELVIS WITH CONTRAST  Technique:  Multidetector CT imaging of the abdomen and pelvis was performed following the standard protocol during bolus administration of intravenous contrast. Sagittal and coronal MPR images reconstructed from axial data set.  Contrast: 50mL OMNIPAQUE IOHEXOL 300 MG/ML  SOLN, OMNIPAQUE IOHEXOL 300 MG/ML  SOLN  Comparison: None  Findings: Lung bases clear. Prior gastric bypass surgery. Residual contrast within the distal thoracic esophagus. Gallbladder surgically absent. Stranding changes identified in the gallbladder fossa, right mid abdomen and and right upper quadrant consistent with recent cholecystectomy. No gross biliary dilatation identified. Tiny left renal cyst.  Spleen, pancreas, kidneys, and adrenal glands otherwise normal appearance. Multiple small rounded foci of low intermediate attenuation are seen in the mid abdomen, several cm up to the largest of 5.5 x 3.2 cm in size. These do not opacify with GI contrast and appear unrelated to bowel. In the absence of a history of malignancy, these likely represent small foci of hematoma associated with the greater omentum. Additional small interloop collection in the right mid abdomen image 50.  Minimal free intraperitoneal fluid. Unremarkable bladder, uterus, ureters and right adnexa. Small left ovarian cysts. No hernia or acute osseous findings.  IMPRESSION: Postsurgical changes from cholecystectomy. Foci of low to intermediate cannulation at the anterior abdomen, questions foci of hematoma associated with greater omentum as result of recent surgery. Peritoneal tumor could have a similar appearance, recommend correlation with laparoscopic  findings and patient history. No evidence of abscess, perforation, or focal bowel/solid organ abnormality.  Bibasilar atelectasis.  Findings discussed with Dr. Lovell Sheehan at time of interpretation, 05/19/2012 at 2315 hours.   Original Report Authenticated By: Ulyses Southward, M.D.     ROS: See chart Blood pressure 100/55, pulse 84, temperature 98.9 F (37.2 C), temperature source Oral, resp. rate 20, height 5\' 6"  (1.676 m), weight 152.409 kg (336 lb), last menstrual period 05/06/2012, SpO2 98.00%. Physical Exam: Morbidly obese white female who appears tired Abdomen: Soft with all incisions healing well. No erythema noted. No hernias noted. No rigidity noted.  Assessment/Plan: Impression: Apparent fever of unknown etiology. I do not see any surgical complication from her surgery 3 weeks ago. No remarkable intra-abdominal abscess or significant liver abnormalities. She is hypokalemic. Plan: No need for acute surgical intervention. No need for antibiotic. I will write her for more pain medication. She or he is scheduled to see Dr. Leticia Sharp in the office in 5 days.  Kitrina Maurin A 05/19/2012, 11:26 PM

## 2012-05-19 NOTE — ED Provider Notes (Signed)
History     CSN: [id number]  Arrival date & time 05/19/12  2017   First MD Initiated Contact with Patient 05/19/12 2039      Chief Complaint  Patient presents with  . Nausea  . Fever  . Weakness  . decreased appetitie   . Abdominal Pain    (Consider location/radiation/quality/duration/timing/severity/associated sxs/prior treatment) Patient is a 47 y.o. female presenting with fever, weakness, and abdominal pain. The history is provided by the patient.  Fever Associated symptoms: diarrhea and nausea   Associated symptoms: no chest pain, no chills, no cough, no dysuria, no headaches, no rash, no sore throat and no vomiting   Weakness Associated symptoms include abdominal pain. Pertinent negatives include no chest pain, no headaches and no shortness of breath.  Abdominal Pain Associated symptoms: diarrhea, fever and nausea   Associated symptoms: no chest pain, no chills, no constipation, no cough, no dysuria, no shortness of breath, no sore throat and no vomiting   pt s/p laparoscopic cholecystectomy 3/7 for gangrenous cholecystitis, c/o increased abd pain, right abd and lower abd for past 1-2 days, along w nausea, decreased appetite, and diarrhea. Pain dull, constant, moderate, non radiating, without specific exacerbating or alleviating factors. States had surgical drain removed 2 days ago, at time of removal was only draining approximately 10 cc per day. Since then she feels her symptoms are worse. Fever w t 100.6 at home. States has several watery stools yesterday, a single loose bm today. No dysuria or gu c/o. No cough, uri c/o, or sob. Pt not currently on any abx tx.      Past Medical History  Diagnosis Date  . Anemia   . Depression   . Anxiety   . Asthma     Past Surgical History  Procedure Laterality Date  . Gastric bypass    . Orthopedic surgery    . Eye surgery    . Cholecystectomy N/A 04/27/2012    Procedure: LAPAROSCOPIC CHOLECYSTECTOMY;  Surgeon: Fabio Bering,  MD;  Location: [facility];  Service: General;  Laterality: N/A;    History reviewed. No pertinent family history.  History  Substance Use Topics  . Smoking status: Never Smoker   . Smokeless tobacco: Not on file  . Alcohol Use: Yes    OB History   Grav Para Term Preterm Abortions TAB SAB Ect Mult Living                  Review of Systems  Constitutional: Positive for fever. Negative for chills.  HENT: Negative for sore throat.   Eyes: Negative for redness.  Respiratory: Negative for cough and shortness of breath.   Cardiovascular: Negative for chest pain.  Gastrointestinal: Positive for nausea, abdominal pain and diarrhea. Negative for vomiting and constipation.  Genitourinary: Negative for dysuria.  Musculoskeletal: Negative for back pain.  Skin: Negative for rash.  Neurological: Negative for headaches.  Hematological: Does not bruise/bleed easily.    Allergies  Review of patient's allergies indicates no known allergies.  Home Medications   Current Outpatient Rx  Name  Route  Sig  Dispense  Refill  . albuterol (PROVENTIL HFA;VENTOLIN HFA) 108 (90 BASE) MCG/ACT inhaler   Inhalation   Inhale 2 puffs into the lungs every 6 (six) hours as needed for wheezing.         Marland Kitchen amoxicillin-clavulanate (AUGMENTIN) 500-125 MG per tablet   Oral   Take 1 tablet (500 mg total) by mouth 3 (three) times daily.   15 tablet  0   . buPROPion (WELLBUTRIN) 100 MG tablet   Oral   Take 100 mg by mouth 2 (two) times daily.         . citalopram (CELEXA) 40 MG tablet   Oral   Take 60 mg by mouth daily.         . cyanocobalamin (,VITAMIN B-12,) 1000 MCG/ML injection   Intramuscular   Inject 1,000 mcg into the muscle every 30 (thirty) days.         . diphenhydrAMINE (BENADRYL) 25 mg capsule   Oral   Take 25 mg by mouth every 6 (six) hours as needed for itching.         . ferrous sulfate 325 (65 FE) MG tablet   Oral   Take 325 mg by mouth daily with breakfast.         .  HYDROcodone-acetaminophen (NORCO/VICODIN) 5-325 MG per tablet   Oral   Take 1-2 tablets by mouth every 4 (four) hours as needed.   45 tablet   0   . ibuprofen (ADVIL,MOTRIN) 200 MG tablet   Oral   Take by mouth every 6 (six) hours as needed for pain.         Marland Kitchen LORazepam (ATIVAN) 0.5 MG tablet   Oral   Take 0.5 mg by mouth at bedtime.         . montelukast (SINGULAIR) 10 MG tablet   Oral   Take 10 mg by mouth at bedtime.         . traZODone (DESYREL) 50 MG tablet   Oral   Take 100 mg by mouth at bedtime as needed for sleep.         Marland Kitchen triamcinolone cream (KENALOG) 0.1 %   Topical   Apply topically 2 (two) times daily.         . Vitamin D, Ergocalciferol, (DRISDOL) 50000 UNITS CAPS   Oral   Take 50,000 Units by mouth every 7 (seven) days.           BP 100/55  Pulse 84  Temp(Src) 98.9 F (37.2 C) (Oral)  Resp 20  Ht 5\' 6"  (1.676 m)  Wt 336 lb (152.409 kg)  BMI 54.26 kg/m2  SpO2 98%  LMP 04/09/2012  Physical Exam  Nursing note and vitals reviewed. Constitutional: She is oriented to person, place, and time. She appears well-developed and well-nourished. She appears distressed.  HENT:  Head: Atraumatic.  Nose: Nose normal.  Mouth/Throat: Oropharynx is clear and moist.  Eyes: Conjunctivae are normal. No scleral icterus.  Neck: Normal range of motion. Neck supple. No tracheal deviation present. No thyromegaly present.  No stiffness or rigidity.   Cardiovascular: Normal rate, regular rhythm, normal heart sounds and intact distal pulses.  Exam reveals no gallop and no friction rub.   No murmur heard. Pulmonary/Chest: Effort normal and breath sounds normal. No respiratory distress.  Abdominal: Soft. Normal appearance and bowel sounds are normal. She exhibits no distension and no mass. There is tenderness. There is no rebound and no guarding.  Obese. Right abdomen, and lower abd tenderness. Prior drain site and prior laparoscopy incisions without sign of  infection.   Genitourinary:  No cva tenderness.  Musculoskeletal: Normal range of motion. She exhibits no edema and no tenderness.  Neurological: She is alert and oriented to person, place, and time. No cranial nerve deficit.  Motor intact bilaterally. Steady gait.   Skin: Skin is warm and dry. No rash noted. She is not diaphoretic.  Psychiatric:  She has a normal mood and affect.    ED Course  Procedures (including critical care time)   Results for orders placed during the hospital encounter of 05/19/12  CBC WITH DIFFERENTIAL      Result Value Range   WBC 8.4  4.0 - 10.5 K/uL   RBC 3.63 (*) 3.87 - 5.11 MIL/uL   Hemoglobin 11.2 (*) 12.0 - 15.0 g/dL   HCT 40.9 (*) 81.1 - 91.4 %   MCV 94.5  78.0 - 100.0 fL   MCH 30.9  26.0 - 34.0 pg   MCHC 32.7  30.0 - 36.0 g/dL   RDW 78.2 (*) 95.6 - 21.3 %   Platelets 322  150 - 400 K/uL   Neutrophils Relative 71  43 - 77 %   Neutro Abs 6.0  1.7 - 7.7 K/uL   Lymphocytes Relative 15  12 - 46 %   Lymphs Abs 1.3  0.7 - 4.0 K/uL   Monocytes Relative 12  3 - 12 %   Monocytes Absolute 1.0  0.1 - 1.0 K/uL   Eosinophils Relative 2  0 - 5 %   Eosinophils Absolute 0.1  0.0 - 0.7 K/uL   Basophils Relative 0  0 - 1 %   Basophils Absolute 0.0  0.0 - 0.1 K/uL   US Abdomen Limited Ruq  04/26/2012  *RADIOLOGY REPORT*  Clinical Data:  History of right upper quadrant abdominal pain.  LIMITED ABDOMINAL ULTRASOUND - RIGHT UPPER QUADRANT  Comparison:  None  Findings:  Gallbladder: There is cholelithiasis.  Tiny echogenic foci are seen.  There is some posterior acoustic shadowing.  These are mixed with sludge. There is gallbladder wall thickening. Gallbladder wall thickness measured 6.9 mm.  There is no evidence of pericholecystic fluid. Negative sonographic Murphy's sign according to the ultrasound technologist.  CBD: Dilated in caliber measuring approximately 7 mm. No dilatation of branching intrahepatic bile ducts is evident.  No choledocholithiasis is demonstrated.   Some portions of the common bile duct obscured by bowel gas.  Liver: Upper normal size and echotexture without focal parenchymal abnormality.  Right Kidney:  No hydronephrosis.  IMPRESSION:  Material was seen within the gallbladder with multiple echogenic foci mixed with sludge.  There appears to be some small amount of posterior acoustic shadowing consistent with cholelithiasis mixed with sludge.The gallbladder is distended. There is thickening of the gallbladder wall which measured  7 mm.  No pericholecystic fluid is evident.  There was no positive ultrasonic Murphy's sign according to technologist.  Abnormal moderate dilatation of common bile duct without evidence of dilatation of branching intrahepatic bile ducts.  No choledocholithiasis was identified.  Some portions were obscured by bowel gas.  Details of the examination were also compromised by body habitus.   Original Report Authenticated By: Onalee Hua Call       MDM  Iv ns bolus. zofran iv. Dilaudid iv.   Labs. Ct.  gen surg on call paged.   Reviewed nursing notes and prior charts for additional history.   Discussed w Dr Lovell Sheehan on pt arrival at her request - he states get labs and ct and call him back if needed.  Labs and ct pending.  Signed out to Dr Juleen China to check labs, ua, and ct, recheck pt and dispo appropriately, calling back Dr Lovell Sheehan after ct if any concern.           Suzi Roots, MD 05/19/12 2152

## 2012-05-30 NOTE — Progress Notes (Signed)
UR Chart Review Completed  

## 2013-04-23 ENCOUNTER — Other Ambulatory Visit: Payer: Self-pay | Admitting: Orthopedic Surgery

## 2013-04-23 DIAGNOSIS — M5410 Radiculopathy, site unspecified: Secondary | ICD-10-CM

## 2013-04-23 DIAGNOSIS — M545 Low back pain, unspecified: Secondary | ICD-10-CM

## 2013-04-28 ENCOUNTER — Other Ambulatory Visit: Payer: BC Managed Care – PPO

## 2013-04-30 ENCOUNTER — Encounter: Payer: Self-pay | Admitting: Neurology

## 2013-05-02 ENCOUNTER — Ambulatory Visit (INDEPENDENT_AMBULATORY_CARE_PROVIDER_SITE_OTHER): Payer: BC Managed Care – PPO | Admitting: Neurology

## 2013-05-02 ENCOUNTER — Encounter: Payer: Self-pay | Admitting: Neurology

## 2013-05-02 VITALS — BP 118/72 | HR 62 | Ht 65.5 in | Wt 340.0 lb

## 2013-05-02 DIAGNOSIS — IMO0002 Reserved for concepts with insufficient information to code with codable children: Secondary | ICD-10-CM

## 2013-05-02 DIAGNOSIS — M5416 Radiculopathy, lumbar region: Secondary | ICD-10-CM

## 2013-05-02 HISTORY — DX: Radiculopathy, lumbar region: M54.16

## 2013-05-02 MED ORDER — CELECOXIB 100 MG PO CAPS: 100.0000 mg | ORAL_CAPSULE | Freq: Two times a day (BID) | ORAL | Status: DC

## 2013-05-02 NOTE — Progress Notes (Signed)
PATIENT: Kristy Sharp DOB: 07-Nov-1965  HISTORICAL  Kristy Sharp is a 48 years old female, referred by her primary care physician Dr. Judd Lien  for evaluation of right sciatic pain for 8 weeks.  She had a past medical history of obesity, status post gastric bypass surgery, has vitamin  B12, vitamin D deficiency, she presented with two-month history of right lower back pain, radiating pain to her right hip, right lateral thigh, right calf, it happens in January 2015 after she slept on soft hotel bed, couple days later, she began to experience her symptoms, gradually gets worse,  she was given steroid shots, followed by by mouth steroids tapering, only temporarily helped her symptoms, her symptoms are getting worse after bearing weight relieved by sitting down or lying down,  She denied left leg symptoms, similar symptoms 2 years ago, relieved by epidural injection,,  REVIEW OF SYSTEMS: Full 14 system review of systems performed and notable only for depression, anxiety, low back pain   ALLERGIES: No Known Allergies  HOME MEDICATIONS: Current Outpatient Prescriptions on File Prior to Visit  Medication Sig Dispense Refill  . albuterol (PROVENTIL HFA;VENTOLIN HFA) 108 (90 BASE) MCG/ACT inhaler Inhale 2 puffs into the lungs every 6 (six) hours as needed for wheezing.      Marland Kitchen buPROPion (WELLBUTRIN) 100 MG tablet Take 100 mg by mouth 2 (two) times daily.      . citalopram (CELEXA) 40 MG tablet Take 60 mg by mouth daily.      . cyanocobalamin (,VITAMIN B-12,) 1000 MCG/ML injection Inject 1,000 mcg into the muscle every 30 (thirty) days.      . diphenhydrAMINE (BENADRYL) 25 mg capsule Take 25 mg by mouth every 6 (six) hours as needed for itching.      Marland Kitchen ibuprofen (ADVIL,MOTRIN) 200 MG tablet Take by mouth every 6 (six) hours as needed for pain.      Marland Kitchen LORazepam (ATIVAN) 0.5 MG tablet Take 0.5 mg by mouth at bedtime.      . montelukast (SINGULAIR) 10 MG tablet Take 10 mg by mouth at bedtime.       . traZODone (DESYREL) 50 MG tablet Take 100 mg by mouth at bedtime as needed for sleep.      Marland Kitchen triamcinolone cream (KENALOG) 0.1 % Apply topically 2 (two) times daily.         PAST MEDICAL HISTORY: Past Medical History  Diagnosis Date  . Anemia   . Depression   . Anxiety   . Asthma   . Allergic rhinitis due to other allergen   . Bursitis, trochanteric   . Cellulitis   . Headache   . Hypercholesteremia   . Hyperglycemia   . Insomnia   . Obesity   . Sciatica   . Sinusitis   . Sleep apnea   . Vitamin B deficiency   . Vitamin D deficiency     PAST SURGICAL HISTORY: Past Surgical History  Procedure Laterality Date  . Gastric bypass  2004  . Orthopedic surgery    . Eye surgery    . Cholecystectomy N/A 04/27/2012    Procedure: LAPAROSCOPIC CHOLECYSTECTOMY;  Surgeon: Donato Heinz, MD;  Location: [facility];  Service: General;  Laterality: N/A;  . Wrist surgery Left   . Cholecystectomy      FAMILY HISTORY: Family History  Problem Relation Age of Onset  . Colon cancer Maternal Grandmother   . Lung cancer Father   . Lupus Mother   . Heart Problems Mother   .  Gout Mother     SOCIAL HISTORY:  History   Social History  . Marital Status: Single    Spouse Name: N/A    Number of Children: 0  . Years of Education: college   Occupational History    Call center   Social History Main Topics  . Smoking status: Never Smoker   . Smokeless tobacco: Never Used  . Alcohol Use: 0.6 oz/week    1 Cans of beer per week     Comment: occas.  . Drug Use: No  . Sexual Activity: Not on file   Other Topics Concern  . Not on file   Social History Narrative   Patient is single   Patient is right-handed.   Patient works full time at a call center    Education some college   Caffeine coffee two cups daily and some times one soda    PHYSICAL EXAM   Filed Vitals:   05/02/13 0830  BP: 118/72  Pulse: 62  Height: 5' 5.5" (1.664 m)  Weight: 340 lb (154.223 kg)    Not  recorded    Body mass index is 55.7 kg/(m^2).   Generalized: In no acute distress  Neck: Supple, no carotid bruits   Cardiac: Regular rate rhythm  Pulmonary: Clear to auscultation bilaterally  Musculoskeletal: No deformity  Neurological examination  Mentation: Alert oriented to time, place, history taking, and causual conversation, obese, tired looking   Cranial nerve II-XII: Pupils were equal round reactive to light. Extraocular movements were full.  Visual field were full on confrontational test. Bilateral fundi were sharp.  Facial sensation and strength were normal. Hearing was intact to finger rubbing bilaterally. Uvula tongue midline.  Head turning and shoulder shrug and were normal and symmetric.Tongue protrusion into cheek strength was normal.  Motor: Normal tone, bulk and strength.  Sensory: Intact to fine touch, pinprick, preserved vibratory sensation, and proprioception at toes.  Coordination: Normal finger to nose, heel-to-shin bilaterally there was no truncal ataxia  Gait: Rising up from seated positionpushing on a chair arm, atalgic gait, able to stand on tiptoe and heels Romberg signs: Negative  Deep tendon reflexes: Brachioradialis 2/2, biceps 2/2, triceps 2/2, patellar 2/2, Achilles 2/2, plantar responses were flexor bilaterally.   DIAGNOSTIC DATA (LABS, IMAGING, TESTING) - I reviewed patient records, labs, notes, testing and imaging myself where available.  Lab Results  Component Value Date   WBC 8.4 05/19/2012   HGB 11.2* 05/19/2012   HCT 34.3* 05/19/2012   MCV 94.5 05/19/2012   PLT 322 05/19/2012      Component Value Date/Time   NA 134* 05/19/2012 2115   K 3.0* 05/19/2012 2115   CL 99 05/19/2012 2115   CO2 24 05/19/2012 2115   GLUCOSE 106* 05/19/2012 2115   BUN 11 05/19/2012 2115   CREATININE 0.70 05/19/2012 2115   CALCIUM 8.7 05/19/2012 2115   PROT 6.6 05/19/2012 2115   ALBUMIN 2.6* 05/19/2012 2115   AST 25 05/19/2012 2115   ALT 21 05/19/2012 2115    ALKPHOS 142* 05/19/2012 2115   BILITOT 0.4 05/19/2012 2115   GFRNONAA >90 05/19/2012 2115   GFRAA >90 05/19/2012 2115   ASSESSMENT AND PLAN  Kristy Sharp is a 48 y.o. female  presenting with right side low back pain, radiating pain to her right hip, right lateral thigh, calf, consistent with right L5, S1 radiculopathy  1. proceed with MRI of lumbar, 2 EMG nerve conduction study 3 Celebrex 100 mg twice a day. Marland Kitchen  Marcial Pacas, M.D. Ph.D.  [facility] [address] [place], [place] [address] [phone]

## 2013-05-09 ENCOUNTER — Encounter (INDEPENDENT_AMBULATORY_CARE_PROVIDER_SITE_OTHER): Payer: Self-pay

## 2013-05-09 ENCOUNTER — Ambulatory Visit (INDEPENDENT_AMBULATORY_CARE_PROVIDER_SITE_OTHER): Payer: BC Managed Care – PPO | Admitting: Neurology

## 2013-05-09 DIAGNOSIS — IMO0002 Reserved for concepts with insufficient information to code with codable children: Secondary | ICD-10-CM

## 2013-05-09 DIAGNOSIS — Z0289 Encounter for other administrative examinations: Secondary | ICD-10-CM

## 2013-05-09 DIAGNOSIS — M5416 Radiculopathy, lumbar region: Secondary | ICD-10-CM

## 2013-05-09 DIAGNOSIS — M79609 Pain in unspecified limb: Secondary | ICD-10-CM

## 2013-05-09 NOTE — Procedures (Signed)
   NCS (NERVE CONDUCTION STUDY) WITH EMG (ELECTROMYOGRAPHY) REPORT   STUDY DATE: March 19th 2015 PATIENT NAME: Kristy Sharp DOB: 07-12-1965 MRN: [id number]    TECHNOLOGIST: Laretta Alstrom ELECTROMYOGRAPHER: Marcial Pacas M.D.  CLINICAL INFORMATION:  48 years old obese Caucasian female, presenting with two-month history of right-sided low back pain, shooting pain to her right hip, right lateral thigh and lateral leg  FINDINGS: NERVE CONDUCTION STUDY: Bilateral peroneal sensory responses were normal. Bilateral peroneal to EDB, and tibial motor responses were normal. Bilateral tibial H. reflexes were normal   NEEDLE ELECTROMYOGRAPHY: Selected needle examination was performed at lower extremity muscles, and right lumbosacral paraspinal muscles  Right tibialis anterior: Increased insertion activity, 1 plus spontaneous activity, enlarged motor unit potential, with mildly decreased recruitment patterns  Right peroneal longus, tibialis posterior, medial gastrocnemius, vastus lateralis : Increased insertion activity, no spontaneous activity, complex enlarged motor unit potential, mixed with normal motor unit potential, with mildly decreased recruitment patterns.  I attempted needle examination of right biceps femoris short head, long head, without success, because of her big body habitus.  It was also a suboptimal study for the right lumbar paraspinal muscles, because of big body habitus. There was no significant spontaneous activity noticed  IMPRESSION:   This is an abnormal study. There is electrodiagnostic evidence of active right lumbar radiculopathy, mainly involving right L5, L4 dermatomes, MRI of lumbar is pending   INTERPRETING PHYSICIAN:   Marcial Pacas M.D. Ph.D. [facility] [address] [place], [place] [address] [phone]

## 2013-05-10 ENCOUNTER — Telehealth: Payer: Self-pay

## 2013-05-10 NOTE — Telephone Encounter (Signed)
[employer] sent [place] a letter stating they have approved our request for coverage on Celebrex effective until 05/06/2014 Ref # [id number]

## 2013-05-19 ENCOUNTER — Other Ambulatory Visit: Payer: BC Managed Care – PPO

## 2013-06-07 ENCOUNTER — Encounter: Payer: BC Managed Care – PPO | Admitting: Neurology

## 2013-06-12 ENCOUNTER — Ambulatory Visit: Payer: BC Managed Care – PPO | Admitting: Neurology

## 2013-12-23 LAB — HM MAMMOGRAPHY

## 2014-04-21 ENCOUNTER — Encounter: Payer: Self-pay | Admitting: Internal Medicine

## 2014-05-15 ENCOUNTER — Ambulatory Visit (INDEPENDENT_AMBULATORY_CARE_PROVIDER_SITE_OTHER): Payer: BLUE CROSS/BLUE SHIELD | Admitting: Internal Medicine

## 2014-05-15 ENCOUNTER — Encounter: Payer: Self-pay | Admitting: Internal Medicine

## 2014-05-15 VITALS — BP 118/64 | HR 82 | Temp 97.6°F | Resp 12 | Ht 67.0 in | Wt 361.0 lb

## 2014-05-15 DIAGNOSIS — R946 Abnormal results of thyroid function studies: Secondary | ICD-10-CM

## 2014-05-15 DIAGNOSIS — R7989 Other specified abnormal findings of blood chemistry: Secondary | ICD-10-CM

## 2014-05-15 DIAGNOSIS — E039 Hypothyroidism, unspecified: Secondary | ICD-10-CM

## 2014-05-15 LAB — TSH: TSH: 3.31 u[IU]/mL (ref 0.35–4.50)

## 2014-05-15 LAB — T4, FREE: FREE T4: 0.72 ng/dL (ref 0.60–1.60)

## 2014-05-15 LAB — T3, FREE: T3, Free: 2.6 pg/mL (ref 2.3–4.2)

## 2014-05-15 NOTE — Progress Notes (Signed)
Patient ID: Kristy Sharp, female   DOB: 1965-10-22, 49 y.o.   MRN: [id number]   HPI  Kristy Sharp is a 49 y.o.-year-old female, referred by her PCP, Dr. Judd Lien [facility], NP), for management of hypothyroidism.  Patient saw PCP on 03/31/2014 and still complained of cold intolerance, continued weight issues, fatigue, depression, headaches. She was referred to endocrinology for management of her hypothyroidism (which appears to be subclinical).   I reviewed pt's thyroid tests per records from PCP: 01/23/2014: TSH 5.050 (0.45-4.5), total T3 100 (71-180), free T4 1.02 (0.82-1.77) Of note, at that time, she has a very low vitamin B12 level, of 161 (211-946). On B12 im inj. >> started once a week now (prev. Once a month). Per pt's report, TSH has been elevated before this, but I do not have the records.  As mentioned above, patient describes: - + weight gain - + extreme fatigue - + cold intolerance - + depression and anxiety - + constipation - + very dry skin and itching - no hair loss  Pt denies feeling nodules in neck, hoarseness, dysphagia/odynophagia, SOB with lying down.  She has + FH of thyroid disorders in: mother - hypothyroidism. No FH of thyroid cancer.  No h/o radiation tx to head or neck. No recent use of iodine supplements.  I reviewed her chart and she also has a history of GBP in 2004 >> since then she has IDA, vitamin D and B12 def.   ROS: Constitutional: see HPI Eyes: + blurry vision, no xerophthalmia ENT: + sore throat, no nodules palpated in throat, no dysphagia/odynophagia, + hoarseness Cardiovascular: no CP/+ SOB/+ palpitations/+ leg swelling Respiratory: + cough/+ SOB/+ wheezing Gastrointestinal: no N/V/+ D/+ C/+ acid reflux Musculoskeletal: + all: muscle/joint aches Skin: no rashes, + easy bruising/ + itching Neurological: no tremors/numbness/tingling/dizziness, + HA Psychiatric: + both depression/anxiety  Past Medical History  Diagnosis Date   . Anemia   . Depression   . Anxiety   . Asthma   . Allergic rhinitis due to other allergen   . Bursitis, trochanteric   . Cellulitis   . Headache   . Hypercholesteremia   . Hyperglycemia   . Insomnia   . Obesity   . Sciatica   . Sinusitis   . Sleep apnea   . Vitamin B deficiency   . Vitamin D deficiency    Past Surgical History  Procedure Laterality Date  . Gastric bypass    . Orthopedic surgery    . Eye surgery    . Cholecystectomy N/A 04/27/2012    Procedure: LAPAROSCOPIC CHOLECYSTECTOMY;  Surgeon: Donato Heinz, MD;  Location: [facility];  Service: General;  Laterality: N/A;  . Wrist surgery Left   . Cholecystectomy     History   Social History  . Marital Status: Single    Spouse Name: N/A  . Number of Children: 0  . Years of Education: college   Occupational History  .      Call center   Social History Main Topics  . Smoking status: Never Smoker   . Smokeless tobacco: Never Used  . Alcohol Use: 0.6 oz/week    1 Cans of beer per week     Comment: occas.  . Drug Use: No  . Sexual Activity: Not on file   Other Topics Concern  . Not on file   Social History Narrative   Patient is single   Patient is right-handed.   Patient works full time at a call center  Education some college   Caffeine coffee two cups daily and some times one soda            Current Outpatient Prescriptions on File Prior to Visit  Medication Sig Dispense Refill  . albuterol (PROVENTIL HFA;VENTOLIN HFA) 108 (90 BASE) MCG/ACT inhaler Inhale 2 puffs into the lungs every 6 (six) hours as needed for wheezing.    Marland Kitchen buPROPion (WELLBUTRIN) 100 MG tablet Take 100 mg by mouth 2 (two) times daily.    . citalopram (CELEXA) 40 MG tablet Take 60 mg by mouth daily.    . cyanocobalamin (,VITAMIN B-12,) 1000 MCG/ML injection Inject 1,000 mcg into the muscle every 30 (thirty) days.    Marland Kitchen ibuprofen (ADVIL,MOTRIN) 200 MG tablet Take by mouth every 6 (six) hours as needed for pain.    Marland Kitchen LORazepam  (ATIVAN) 0.5 MG tablet Take 0.5 mg by mouth at bedtime.    . traZODone (DESYREL) 50 MG tablet Take 100 mg by mouth at bedtime as needed for sleep.    . celecoxib (CELEBREX) 100 MG capsule Take 1 capsule (100 mg total) by mouth 2 (two) times daily. (Patient not taking: Reported on 05/15/2014) 60 capsule 12  . diphenhydrAMINE (BENADRYL) 25 mg capsule Take 25 mg by mouth every 6 (six) hours as needed for itching.    . montelukast (SINGULAIR) 10 MG tablet Take 10 mg by mouth at bedtime.    . traMADol (ULTRAM) 50 MG tablet 50 mg as needed.    . triamcinolone cream (KENALOG) 0.1 % Apply topically 2 (two) times daily.     No current facility-administered medications on file prior to visit.   No Known Allergies Family History  Problem Relation Age of Onset  . Colon cancer Maternal Grandmother   . Lung cancer Father   . Lupus Mother   . Heart Problems Mother   . Gout Mother    PE: BP 118/64 mmHg  Pulse 82  Temp(Src) 97.6 F (36.4 C) (Oral)  Resp 12  Ht 5\' 7"  (1.702 m)  Wt 361 lb (163.749 kg)  BMI 56.53 kg/m2  SpO2 97% Wt Readings from Last 3 Encounters:  05/15/14 361 lb (163.749 kg)  05/02/13 340 lb (154.223 kg)  05/19/12 336 lb (152.409 kg)   Constitutional: obese class 3, in NAD Eyes: PERRLA, EOMI, no exophthalmos ENT: moist mucous membranes, no thyromegaly, no cervical lymphadenopathy Cardiovascular: RRR, No MRG, + pitting edema bilateral legs Respiratory: CTA B Gastrointestinal: abdomen soft, NT, ND, BS+ Musculoskeletal: no deformities, strength intact in all 4 Skin: moist, warm, no rashes Neurological: no tremor with outstretched hands, DTR normal in all 4  ASSESSMENT: 1. Hypothyroidism - mild overt vs subclinical  PLAN:  1. Patient with mildly high TSH levels, not on levothyroxine therapy. She is wondering if the mild elevation of her TSH is the reason for her fatigue, dry skin and weight gain. I explained that the elevation of TSH is minimal and I doubt this is the cause  for her sxs. She also has several vitamin D deficiencies and IDA which may explain the fatigue... Also, obesity is associated with a higher TSH, which then return to normal as the weight is lost. However, I agreed to recheck her TFTs and, if TSH still high, we can try a low dose LT4 to see if she feels better. She does not appear to have a goiter, thyroid nodules, or neck compression symptoms - We discussed about correct intake of levothyroxine, in case she needs to start that: fasting,  with water, separated by at least 30 minutes from breakfast, and separated by more than 4 hours from calcium, iron, multivitamins, acid reflux medications (PPIs). - will check thyroid tests today: TSH, free T4, free T3 and TPO Abs - If these are abnormal, she will need to return in 6 weeks for repeat labs - If these are normal, I will see her back in 6 months  Component     Latest Ref Rng 05/15/2014  TSH     0.35 - 4.50 uIU/mL 3.31  Free T4     0.60 - 1.60 ng/dL 0.72  T3, Free     2.3 - 4.2 pg/mL 2.6  Thyroperoxidase Ab SerPl-aCnc     <9 IU/mL 167 (H)  Normal TFTs but high TPO Abs >> new dx. Of Hashimoto's thyroiditis. No tx indicated for now. Will repeat TFTs in 6 mo when she comes back.

## 2014-05-15 NOTE — Patient Instructions (Signed)
Please stop at the lab.  Please return in 6 months with your sugar log.

## 2014-05-16 LAB — THYROID PEROXIDASE ANTIBODY: THYROID PEROXIDASE ANTIBODY: 167 [IU]/mL — AB (ref <9)

## 2014-11-17 ENCOUNTER — Other Ambulatory Visit (INDEPENDENT_AMBULATORY_CARE_PROVIDER_SITE_OTHER): Payer: BLUE CROSS/BLUE SHIELD | Admitting: *Deleted

## 2014-11-17 ENCOUNTER — Encounter: Payer: Self-pay | Admitting: Internal Medicine

## 2014-11-17 ENCOUNTER — Ambulatory Visit (INDEPENDENT_AMBULATORY_CARE_PROVIDER_SITE_OTHER): Payer: BLUE CROSS/BLUE SHIELD | Admitting: Internal Medicine

## 2014-11-17 ENCOUNTER — Other Ambulatory Visit: Payer: Self-pay | Admitting: *Deleted

## 2014-11-17 VITALS — BP 118/64 | HR 94 | Temp 97.5°F | Resp 12 | Wt 363.6 lb

## 2014-11-17 DIAGNOSIS — Z23 Encounter for immunization: Secondary | ICD-10-CM

## 2014-11-17 DIAGNOSIS — E063 Autoimmune thyroiditis: Secondary | ICD-10-CM | POA: Diagnosis not present

## 2014-11-17 LAB — TSH: TSH: 4.4 u[IU]/mL (ref 0.35–4.50)

## 2014-11-17 LAB — T4, FREE: FREE T4: 0.97 ng/dL (ref 0.60–1.60)

## 2014-11-17 LAB — T3, FREE: T3 FREE: 3 pg/mL (ref 2.3–4.2)

## 2014-11-17 NOTE — Telephone Encounter (Signed)
Opened encounter in error  

## 2014-11-17 NOTE — Patient Instructions (Signed)
Please return in 6 months for a lab visit.  Please return in 1 year.

## 2014-11-17 NOTE — Progress Notes (Signed)
Patient ID: Kristy Sharp, female   DOB: [birth year], 49 y.o.   MRN: [id number]   HPI  Kristy Sharp is a 49 y.o.-year-old female, initially referred by her PCP, Dr. Judd Lien [facility], NP), now returning for f/u for Hashimoto's thyroiditis.  Reviewed hx: Patient saw PCP on 03/31/2014 and still complained of cold intolerance, continued weight issues, fatigue, depression, headaches. She was referred to endocrinology for management of her hypothyroidism.  Labs at last visit: Component     Latest Ref Rng 05/15/2014  TSH     0.35 - 4.50 uIU/mL 3.31  Free T4     0.60 - 1.60 ng/dL 0.72  T3, Free     2.3 - 4.2 pg/mL 2.6  Thyroperoxidase Ab SerPl-aCnc     <9 IU/mL 167 (H)   I reviewed pt's thyroid tests per records from PCP: 01/23/2014: TSH 5.050 (0.45-4.5), total T3 100 (71-180), free T4 1.02 (0.82-1.77) Of note, at that time, she has a very low vitamin B12 level, of 161 (211-946). On B12 im inj. Per pt's report, TSH has been elevated before this, but I do not have the records.  She restarted iron infusions for IDA >> feels better.   As mentioned above, patient describes: - + Mild  weight gain - + fatigue - better - no cold intolerance, + hot flushes - + depression and anxiety - no constipation, + diarrhea - improved dry skin  - no hair loss  Pt denies feeling nodules in neck, hoarseness, dysphagia/odynophagia, SOB with lying down.  She has + FH of thyroid disorders in: mother - hypothyroidism. No FH of thyroid cancer. No h/o radiation tx to head or neck. No recent use of iodine supplements.  She also has a history of GBP in 2004 >> since then she has IDA, vitamin D and B12 def.   ROS: Constitutional: see HPI Eyes: + blurry vision, no xerophthalmia ENT: no sore throat, no nodules palpated in throat, no dysphagia/odynophagia, + hoarseness Cardiovascular: no CP/+ SOB/+ palpitations/+ leg swelling R>L Respiratory: no cough/+ SOB/no wheezing Gastrointestinal: no N/V/+  D/no C/no acid reflux Musculoskeletal: no muscle/joint aches Skin: no rashes Neurological: no tremors/numbness/tingling/dizziness, + HA  I reviewed pt's medications, allergies, PMH, social hx, family hx, and changes were documented in the history of present illness. Otherwise, unchanged from my initial visit note.  Past Medical History  Diagnosis Date  . Anemia   . Depression   . Anxiety   . Asthma   . Allergic rhinitis due to other allergen   . Bursitis, trochanteric   . Cellulitis   . Headache   . Hypercholesteremia   . Hyperglycemia   . Insomnia   . Obesity   . Sciatica   . Sinusitis   . Sleep apnea   . Vitamin B deficiency   . Vitamin D deficiency    Past Surgical History  Procedure Laterality Date  . Gastric bypass    . Orthopedic surgery    . Eye surgery    . Cholecystectomy N/A 04/27/2012    Procedure: LAPAROSCOPIC CHOLECYSTECTOMY;  Surgeon: Donato Heinz, MD;  Location: [facility];  Service: General;  Laterality: N/A;  . Wrist surgery Left   . Cholecystectomy     Social History   Social History  . Marital Status: Single    Spouse Name: N/A  . Number of Children: 0  . Years of Education: college   Occupational History  .      Call center   Social History Main Topics  .  Smoking status: Never Smoker   . Smokeless tobacco: Never Used  . Alcohol Use: 0.6 oz/week    1 Cans of beer per week     Comment: occas.  . Drug Use: No  . Sexual Activity: Not on file   Other Topics Concern  . Not on file   Social History Narrative   Patient is single   Patient is right-handed.   Patient works full time at a call center    Education some college   Caffeine coffee two cups daily and some times one soda            Current Outpatient Prescriptions on File Prior to Visit  Medication Sig Dispense Refill  . albuterol (PROVENTIL HFA;VENTOLIN HFA) 108 (90 BASE) MCG/ACT inhaler Inhale 2 puffs into the lungs every 6 (six) hours as needed for wheezing.    Marland Kitchen buPROPion  (WELLBUTRIN) 100 MG tablet Take 100 mg by mouth 2 (two) times daily.    . Cholecalciferol (VITAMIN D PO) Take 1 capsule by mouth at bedtime.    . citalopram (CELEXA) 40 MG tablet Take 60 mg by mouth daily.    . cyanocobalamin (,VITAMIN B-12,) 1000 MCG/ML injection Inject 1,000 mcg into the muscle every 30 (thirty) days.    . diphenhydrAMINE (BENADRYL) 25 mg capsule Take 25 mg by mouth every 6 (six) hours as needed for itching.    Marland Kitchen ibuprofen (ADVIL,MOTRIN) 200 MG tablet Take by mouth every 6 (six) hours as needed for pain.    Marland Kitchen LORazepam (ATIVAN) 0.5 MG tablet Take 0.5 mg by mouth at bedtime.    . montelukast (SINGULAIR) 10 MG tablet Take 10 mg by mouth at bedtime.    . traZODone (DESYREL) 50 MG tablet Take 100 mg by mouth at bedtime as needed for sleep.    . celecoxib (CELEBREX) 100 MG capsule Take 1 capsule (100 mg total) by mouth 2 (two) times daily. (Patient not taking: Reported on 05/15/2014) 60 capsule 12  . triamcinolone cream (KENALOG) 0.1 % Apply topically 2 (two) times daily.     No current facility-administered medications on file prior to visit.   No Known Allergies Family History  Problem Relation Age of Onset  . Colon cancer Maternal Grandmother   . Lung cancer Father   . Lupus Mother   . Heart Problems Mother   . Gout Mother    PE: BP 118/64 mmHg  Pulse 94  Temp(Src) 97.5 F (36.4 C) (Oral)  Resp 12  Wt 363 lb 9.6 oz (164.928 kg)  SpO2 97% Wt Readings from Last 3 Encounters:  11/17/14 363 lb 9.6 oz (164.928 kg)  05/15/14 361 lb (163.749 kg)  05/02/13 340 lb (154.223 kg)   Constitutional: obesity class 3, in NAD Eyes: PERRLA, EOMI, no exophthalmos ENT: moist mucous membranes, no thyromegaly, no cervical lymphadenopathy Cardiovascular: RRR, No MRG, + pitting edema bilateral legs Respiratory: CTA B Gastrointestinal: abdomen soft, NT, ND, BS+ Musculoskeletal: no deformities, strength intact in all 4 Skin: moist, warm, no rashes Neurological: no tremor with  outstretched hands, DTR normal in all 4  ASSESSMENT: 1. Hashimoto thyroiditis - Euthyroid  PLAN:  1. Patient with history of mildly high TSH levels, which has normalized at last check, 6 months ago. She is not on levothyroxine therapy. She has a new diagnosis of Hashimoto's thyroiditis, diagnosed at last visit, based on elevated TPO antibodies. We discussed about what this means, the fact that it is a autoimmune disorder, and she may develop hypothyroidism in the  future. We should continue to keep an eye on her TSH and if it increases, she may need to start thyroid hormones. I advised her that that the beginning of her diagnosis of Hashimoto thyroiditis, her TFTs might fluctuate. - She does not appear to have a goiter, thyroid nodules, or neck compression symptoms - We discussed about correct intake of levothyroxine, in case she needs to start that: fasting, with water, separated by at least 30 minutes from breakfast, and separated by more than 4 hours from calcium, iron, multivitamins, acid reflux medications (PPIs). - will check thyroid tests today: TSH, free T4, free T3 - We will then repeat the labs in 6 months and I will see her back for another visit in one year  Office Visit on 11/17/2014  Component Date Value Ref Range Status  . TSH 11/17/2014 4.40  0.35 - 4.50 uIU/mL Final  . Free T4 11/17/2014 0.97  0.60 - 1.60 ng/dL Final  . T3, Free 11/17/2014 3.0  2.3 - 4.2 pg/mL Final   Labs are normal. Will repeat them in 6 mo.

## 2016-03-02 DIAGNOSIS — N6489 Other specified disorders of breast: Secondary | ICD-10-CM | POA: Diagnosis not present

## 2016-03-02 DIAGNOSIS — R928 Other abnormal and inconclusive findings on diagnostic imaging of breast: Secondary | ICD-10-CM | POA: Diagnosis not present

## 2016-03-02 LAB — HM MAMMOGRAPHY

## 2016-04-15 ENCOUNTER — Ambulatory Visit (HOSPITAL_COMMUNITY): Payer: BLUE CROSS/BLUE SHIELD

## 2016-04-27 ENCOUNTER — Encounter (HOSPITAL_COMMUNITY): Payer: BLUE CROSS/BLUE SHIELD

## 2016-04-27 ENCOUNTER — Encounter (HOSPITAL_COMMUNITY): Payer: BLUE CROSS/BLUE SHIELD | Attending: Oncology | Admitting: Oncology

## 2016-04-27 ENCOUNTER — Other Ambulatory Visit (HOSPITAL_COMMUNITY): Payer: Self-pay | Admitting: Oncology

## 2016-04-27 ENCOUNTER — Encounter (HOSPITAL_COMMUNITY): Payer: Self-pay

## 2016-04-27 VITALS — BP 104/46 | HR 68 | Temp 98.0°F | Resp 16 | Ht 66.0 in | Wt 371.9 lb

## 2016-04-27 DIAGNOSIS — D509 Iron deficiency anemia, unspecified: Secondary | ICD-10-CM | POA: Insufficient documentation

## 2016-04-27 DIAGNOSIS — D508 Other iron deficiency anemias: Secondary | ICD-10-CM

## 2016-04-27 DIAGNOSIS — Z800 Family history of malignant neoplasm of digestive organs: Secondary | ICD-10-CM | POA: Diagnosis not present

## 2016-04-27 DIAGNOSIS — E669 Obesity, unspecified: Secondary | ICD-10-CM | POA: Insufficient documentation

## 2016-04-27 DIAGNOSIS — Z9884 Bariatric surgery status: Secondary | ICD-10-CM | POA: Diagnosis not present

## 2016-04-27 DIAGNOSIS — J45909 Unspecified asthma, uncomplicated: Secondary | ICD-10-CM | POA: Insufficient documentation

## 2016-04-27 DIAGNOSIS — Z9049 Acquired absence of other specified parts of digestive tract: Secondary | ICD-10-CM | POA: Diagnosis not present

## 2016-04-27 DIAGNOSIS — F419 Anxiety disorder, unspecified: Secondary | ICD-10-CM | POA: Diagnosis not present

## 2016-04-27 DIAGNOSIS — E538 Deficiency of other specified B group vitamins: Secondary | ICD-10-CM | POA: Diagnosis not present

## 2016-04-27 DIAGNOSIS — G4730 Sleep apnea, unspecified: Secondary | ICD-10-CM | POA: Insufficient documentation

## 2016-04-27 DIAGNOSIS — Z8489 Family history of other specified conditions: Secondary | ICD-10-CM | POA: Diagnosis not present

## 2016-04-27 DIAGNOSIS — E559 Vitamin D deficiency, unspecified: Secondary | ICD-10-CM | POA: Insufficient documentation

## 2016-04-27 DIAGNOSIS — E7800 Pure hypercholesterolemia, unspecified: Secondary | ICD-10-CM | POA: Insufficient documentation

## 2016-04-27 DIAGNOSIS — Z79899 Other long term (current) drug therapy: Secondary | ICD-10-CM | POA: Insufficient documentation

## 2016-04-27 DIAGNOSIS — Z801 Family history of malignant neoplasm of trachea, bronchus and lung: Secondary | ICD-10-CM | POA: Diagnosis not present

## 2016-04-27 DIAGNOSIS — R42 Dizziness and giddiness: Secondary | ICD-10-CM | POA: Insufficient documentation

## 2016-04-27 DIAGNOSIS — G4700 Insomnia, unspecified: Secondary | ICD-10-CM | POA: Insufficient documentation

## 2016-04-27 DIAGNOSIS — F329 Major depressive disorder, single episode, unspecified: Secondary | ICD-10-CM | POA: Diagnosis not present

## 2016-04-27 DIAGNOSIS — Z9889 Other specified postprocedural states: Secondary | ICD-10-CM | POA: Insufficient documentation

## 2016-04-27 HISTORY — DX: Iron deficiency anemia, unspecified: D50.9

## 2016-04-27 HISTORY — DX: Deficiency of other specified B group vitamins: E53.8

## 2016-04-27 LAB — COMPREHENSIVE METABOLIC PANEL
ALBUMIN: 3.4 g/dL — AB (ref 3.5–5.0)
ALK PHOS: 43 U/L (ref 38–126)
ALT: 10 U/L — AB (ref 14–54)
AST: 17 U/L (ref 15–41)
Anion gap: 5 (ref 5–15)
BUN: 14 mg/dL (ref 6–20)
CALCIUM: 8.7 mg/dL — AB (ref 8.9–10.3)
CO2: 26 mmol/L (ref 22–32)
CREATININE: 0.71 mg/dL (ref 0.44–1.00)
Chloride: 108 mmol/L (ref 101–111)
GFR calc Af Amer: >60 mL/min (ref >60)
GFR calc non Af Amer: >60 mL/min (ref >60)
GLUCOSE: 103 mg/dL — AB (ref 65–99)
Potassium: 3.9 mmol/L (ref 3.5–5.1)
Sodium: 139 mmol/L (ref 135–145)
Total Bilirubin: 0.5 mg/dL (ref 0.3–1.2)
Total Protein: 6.6 g/dL (ref 6.5–8.1)

## 2016-04-27 LAB — CBC WITH DIFFERENTIAL/PLATELET
BASOS PCT: 1 %
Basophils Absolute: 0 10*3/uL (ref 0.0–0.1)
Eosinophils Absolute: 0.1 10*3/uL (ref 0.0–0.7)
Eosinophils Relative: 2 %
HCT: 39.1 % (ref 36.0–46.0)
HEMOGLOBIN: 12.5 g/dL (ref 12.0–15.0)
LYMPHS ABS: 1.2 10*3/uL (ref 0.7–4.0)
Lymphocytes Relative: 30 %
MCH: 31.2 pg (ref 26.0–34.0)
MCHC: 32 g/dL (ref 30.0–36.0)
MCV: 97.5 fL (ref 78.0–100.0)
Monocytes Absolute: 0.3 10*3/uL (ref 0.1–1.0)
Monocytes Relative: 8 %
NEUTROS ABS: 2.4 10*3/uL (ref 1.7–7.7)
NEUTROS PCT: 59 %
Platelets: 219 10*3/uL (ref 150–400)
RBC: 4.01 MIL/uL (ref 3.87–5.11)
RDW: 15.1 % (ref 11.5–15.5)
WBC: 4 10*3/uL (ref 4.0–10.5)

## 2016-04-27 LAB — RETICULOCYTES
RBC.: 4.01 MIL/uL (ref 3.87–5.11)
Retic Count, Absolute: 56.1 10*3/uL (ref 19.0–186.0)
Retic Ct Pct: 1.4 % (ref 0.4–3.1)

## 2016-04-27 LAB — IRON AND TIBC
Iron: 105 ug/dL (ref 28–170)
SATURATION RATIOS: 28 % (ref 10.4–31.8)
TIBC: 377 ug/dL (ref 250–450)
UIBC: 272 ug/dL

## 2016-04-27 LAB — FERRITIN: Ferritin: 8 ng/mL — ABNORMAL LOW (ref 11–307)

## 2016-04-27 LAB — FOLATE: FOLATE: 21.6 ng/mL (ref >5.9)

## 2016-04-27 LAB — VITAMIN B12: VITAMIN B 12: 233 pg/mL (ref 180–914)

## 2016-04-27 NOTE — Patient Instructions (Signed)
[place] at [facility] Discharge Instructions  RECOMMENDATIONS MADE BY THE CONSULTANT AND ANY TEST RESULTS WILL BE SENT TO YOUR    REFERRING PHYSICIAN.  You were seen today by Dr. Twana First Lab work today Follow up in 6 weeks with lab work again at that time See Amy up front for appointments   Thank you for choosing [place] at [facility] to provide your oncology and hematology care.  To afford each patient quality time with our provider, please arrive at least 15 minutes before your scheduled appointment time.    If you have a lab appointment with the [facility] please come in thru the  Main Entrance and check in at the main information desk  You need to re-schedule your appointment should you arrive 10 or more minutes late.  We strive to give you quality time with our providers, and arriving late affects you and other patients whose appointments are after yours.  Also, if you no show three or more times for appointments you may be dismissed from the clinic at the providers discretion.     Again, thank you for choosing [facility].  Our hope is that these requests will decrease the amount of time that you wait before being seen by our physicians.       _____________________________________________________________  Should you have questions after your visit to [facility], please contact our office at [phone] between the hours of 8:30 a.m. and 4:30 p.m.  Voicemails left after 4:30 p.m. will not be returned until the following business day.  For prescription refill requests, have your pharmacy contact our office.       Resources For Cancer Patients and their Caregivers ? American Cancer Society: Can assist with transportation, wigs, general needs, runs Look Good Feel Better.        (346)256-1377 ? Cancer Care: Provides financial assistance, online support groups, medication/co-pay assistance.   1-800-813-HOPE 6012906713) ? [place] Assists [place] cancer patients and their families through emotional , educational and financial support.  819-682-6805 ? [place] DSS Where to apply for food stamps, Medicaid and utility assistance. (708) 416-0663 ? [facility]: Transportation to medical appointments. 479-566-8457 ? Social Security Administration: May apply for disability if have a Stage IV cancer. (646) 270-7736 941-779-5911 ? [employer], Disability and Transit Services: Assists with nutrition, care and transit needs. [place] Support Programs: @10RELATIVEDAYS @ > Cancer Support Group  2nd Tuesday of the month 1pm-2pm, Journey Room  > Creative Journey  3rd Tuesday of the month 1130am-1pm, Journey Room  > Look Good Feel Better  1st Wednesday of the month 10am-12 noon, Journey Room (Call [place] to register [phone])

## 2016-04-27 NOTE — Progress Notes (Signed)
[facility]  CONSULT NOTE  Patient Care Team: Curlene Labrum, MD as PCP - General  CHIEF COMPLAINTS/PURPOSE OF CONSULTATION:  Anemia  B12 deficiency   No history exists.    HISTORY OF PRESENTING ILLNESS:  Kristy Sharp 51 y.o. female has a history of gastric bypass surgery and is here because of anemia with B12 deficiency. She has previously seen Dr. Jacquiline Doe for her anemia. She has had IV iron in the past and takes B12 supplements.   She has been very tired and dizzy lately. She had a gastric bypass in 2004, and has been getting IV iron ever since. Her last IV iron was almost 2 years ago; she responded well. She still gets her period, but she wouldn't say that they're heavy, but they do last for 5 days. She craves cold food, but denies pica. She gives herself B12 injections once a month.  She has some occasional SOB. Denies hematuria, hematochezia, melena, easy bleeding, abdominal pain, or any other concerns.   MEDICAL HISTORY:  Past Medical History:  Diagnosis Date  . Allergic rhinitis due to other allergen   . Anemia   . Anxiety   . Asthma   . Bursitis, trochanteric   . Cellulitis   . Depression   . Headache   . Hypercholesteremia   . Hyperglycemia   . Insomnia   . Obesity   . Sciatica   . Sinusitis   . Sleep apnea   . Vitamin B deficiency   . Vitamin D deficiency     SURGICAL HISTORY: Past Surgical History:  Procedure Laterality Date  . CHOLECYSTECTOMY N/A 04/27/2012   Procedure: LAPAROSCOPIC CHOLECYSTECTOMY;  Surgeon: Donato Heinz, MD;  Location: [facility];  Service: General;  Laterality: N/A;  . CHOLECYSTECTOMY    . EYE SURGERY    . GASTRIC BYPASS    . ORTHOPEDIC SURGERY    . WRIST SURGERY Left     SOCIAL HISTORY: Social History   Social History  . Marital status: Single    Spouse name: N/A  . Number of children: 0  . Years of education: college   Occupational History  .  [employer]    Call center   Social History Main Topics    . Smoking status: Never Smoker  . Smokeless tobacco: Never Used  . Alcohol use 0.6 oz/week    1 Cans of beer per week     Comment: occas.  . Drug use: No  . Sexual activity: Not on file   Other Topics Concern  . Not on file   Social History Narrative   Patient is single   Patient is right-handed.   Patient works full time at a call center    Education some college   Caffeine coffee two cups daily and some times one soda             FAMILY HISTORY: Family History  Problem Relation Age of Onset  . Colon cancer Maternal Grandmother   . Lung cancer Father   . Lupus Mother   . Heart Problems Mother   . Gout Mother     ALLERGIES:  has No Known Allergies.  MEDICATIONS:  Current Outpatient Prescriptions  Medication Sig Dispense Refill  . albuterol (PROVENTIL HFA;VENTOLIN HFA) 108 (90 BASE) MCG/ACT inhaler Inhale 2 puffs into the lungs every 6 (six) hours as needed for wheezing.    Marland Kitchen buPROPion (WELLBUTRIN) 100 MG tablet Take 100 mg by mouth daily.    . Cholecalciferol (  VITAMIN D PO) Take 1 capsule by mouth at bedtime.    . citalopram (CELEXA) 40 MG tablet Take 40 mg by mouth daily.    . cyanocobalamin (,VITAMIN B-12,) 1000 MCG/ML injection Inject 1,000 mcg into the muscle every 30 (thirty) days.    . diphenhydrAMINE (BENADRYL) 25 mg capsule Take 25 mg by mouth every 6 (six) hours as needed for itching.    Marland Kitchen FOLIC ACID PO Take 1 tablet by mouth daily.    Marland Kitchen ibuprofen (ADVIL,MOTRIN) 200 MG tablet Take by mouth every 6 (six) hours as needed for pain.    Marland Kitchen LORazepam (ATIVAN) 1 MG tablet Take 1 mg by mouth 3 (three) times daily as needed.    . montelukast (SINGULAIR) 10 MG tablet Take 10 mg by mouth at bedtime.    . traZODone (DESYREL) 50 MG tablet Take 100 mg by mouth at bedtime as needed for sleep.    Marland Kitchen triamcinolone cream (KENALOG) 0.1 % Apply topically 2 (two) times daily.     No current facility-administered medications for this visit.    Review of Systems   Constitutional: Positive for malaise/fatigue.       No pica  HENT: Negative.   Eyes: Negative.   Respiratory: Positive for shortness of breath.   Cardiovascular: Negative.   Gastrointestinal: Negative.  Negative for abdominal pain, blood in stool and melena.  Genitourinary: Negative.  Negative for hematuria.  Musculoskeletal: Negative.   Skin: Negative.   Neurological: Positive for dizziness.  Endo/Heme/Allergies: Negative.  Does not bruise/bleed easily.  Psychiatric/Behavioral: Negative.   All other systems reviewed and are negative. 14 point ROS was done and is otherwise as detailed above or in HPI  PHYSICAL EXAMINATION: ECOG PERFORMANCE STATUS: 1 - Symptomatic but completely ambulatory  Vitals:   04/27/16 0836  BP: (!) 104/46  Pulse: 68  Resp: 16  Temp: 98 F (36.7 C)   Filed Weights   04/27/16 0836  Weight: (!) 371 lb 14.4 oz (168.7 kg)   Physical Exam  Constitutional: She is oriented to person, place, and time and well-developed, well-nourished, and in no distress.  HENT:  Head: Normocephalic and atraumatic.  Eyes: EOM are normal. Pupils are equal, round, and reactive to light.  Neck: Normal range of motion. Neck supple.  Cardiovascular: Normal rate, regular rhythm and normal heart sounds.   Pulmonary/Chest: Effort normal and breath sounds normal.  Abdominal: Soft. Bowel sounds are normal.  Musculoskeletal: Normal range of motion.  Neurological: She is alert and oriented to person, place, and time. Gait normal.  Skin: Skin is warm and dry.  Nursing note and vitals reviewed.  LABORATORY DATA:  I have reviewed the data as listed Lab Results  Component Value Date   WBC 8.4 05/19/2012   HGB 11.2 (L) 05/19/2012   HCT 34.3 (L) 05/19/2012   MCV 94.5 05/19/2012   PLT 322 05/19/2012   CMP     Component Value Date/Time   NA 134 (L) 05/19/2012 2115   K 3.0 (L) 05/19/2012 2115   CL 99 05/19/2012 2115   CO2 24 05/19/2012 2115   GLUCOSE 106 (H) 05/19/2012 2115    BUN 11 05/19/2012 2115   CREATININE 0.70 05/19/2012 2115   CALCIUM 8.7 05/19/2012 2115   PROT 6.6 05/19/2012 2115   ALBUMIN 2.6 (L) 05/19/2012 2115   AST 25 05/19/2012 2115   ALT 21 05/19/2012 2115   ALKPHOS 142 (H) 05/19/2012 2115   BILITOT 0.4 05/19/2012 2115   GFRNONAA >90 05/19/2012 2115  GFRAA >90 05/19/2012 2115   RADIOGRAPHIC STUDIES: I have personally reviewed the radiological images as listed and agreed with the findings in the report. No results found.  ASSESSMENT & PLAN:  Iron deficiency anemia and Vitamin B12 deficiency likely due to malabsorption after gastric bypass  PLAN: I do not have any labs available to me today.  If patient's ferritin is <30, will plan to give her 2 doses of injectafer. I have gone over administration, dosage, side effects of IV iron with the patient.  Labs today. CBC, CMP, iron study, B12, ferritin, path review, peripheral smear review, folate, retic count, SPEP, IFE.  She will return for follow up in 6 weeks with CBC, CMP, iron studies.Kayleen Memos PLACED FOR THIS ENCOUNTER: No orders of the defined types were placed in this encounter.   MEDICATIONS PRESCRIBED THIS ENCOUNTER: Meds ordered this encounter  Medications  . buPROPion (WELLBUTRIN) 100 MG tablet    Sig: Take 100 mg by mouth daily.  . citalopram (CELEXA) 40 MG tablet    Sig: Take 40 mg by mouth daily.  Marland Kitchen LORazepam (ATIVAN) 1 MG tablet    Sig: Take 1 mg by mouth 3 (three) times daily as needed.   All questions were answered. The patient knows to call the clinic with any problems, questions or concerns.  This document serves as a record of services personally performed by Twana First, MD. It was created on her behalf by Martinique Casey, a trained medical scribe. The creation of this record is based on the scribe's personal observations and the provider's statements to them. This document has been checked and approved by the attending provider.  I have reviewed the above  documentation for accuracy and completeness and I agree with the above.  This note was electronically signed.    Martinique M Casey  04/27/2016 8:55 AM

## 2016-04-28 LAB — IMMUNOFIXATION ELECTROPHORESIS
IGG (IMMUNOGLOBIN G), SERUM: 850 mg/dL (ref 700–1600)
IgA: 271 mg/dL (ref 87–352)
IgM, Serum: 139 mg/dL (ref 26–217)
TOTAL PROTEIN ELP: 6.4 g/dL (ref 6.0–8.5)

## 2016-04-28 LAB — PROTEIN ELECTROPHORESIS, SERUM
A/G Ratio: 1.1 (ref 0.7–1.7)
ALPHA-1-GLOBULIN: 0.2 g/dL (ref 0.0–0.4)
Albumin ELP: 3.3 g/dL (ref 2.9–4.4)
Alpha-2-Globulin: 0.7 g/dL (ref 0.4–1.0)
Beta Globulin: 1.1 g/dL (ref 0.7–1.3)
GAMMA GLOBULIN: 1 g/dL (ref 0.4–1.8)
GLOBULIN, TOTAL: 3 g/dL (ref 2.2–3.9)
TOTAL PROTEIN ELP: 6.3 g/dL (ref 6.0–8.5)

## 2016-05-09 ENCOUNTER — Other Ambulatory Visit (HOSPITAL_COMMUNITY): Payer: Self-pay | Admitting: Oncology

## 2016-05-10 ENCOUNTER — Telehealth (HOSPITAL_COMMUNITY): Payer: Self-pay

## 2016-05-10 NOTE — Telephone Encounter (Signed)
-----   Message from Baird Cancer, PA-C sent at 05/09/2016  8:02 PM EDT ----- I have reviewed labs from Dr. Talbert Cage.  Iron deficit is 542 mg.  Orders placed for 1 dose of feraheme.  TK  ----- Message ----- From: Darlina Guys, LPN Sent: [id number]/7425   1:09 PM To: Baird Cancer, PA-C  Patient called stating she has not heard from her labs she had performed on 04/27/16 and wants to know if she needs an Iron infusion.  Thanks, MB

## 2016-05-10 NOTE — Telephone Encounter (Signed)
Called patient back and left message letting her know she will need IV iron. Message sent to scheduler. She is to call cancer center if any questions.

## 2016-05-13 ENCOUNTER — Encounter (HOSPITAL_COMMUNITY): Payer: Self-pay

## 2016-05-13 ENCOUNTER — Encounter (HOSPITAL_BASED_OUTPATIENT_CLINIC_OR_DEPARTMENT_OTHER): Payer: BLUE CROSS/BLUE SHIELD

## 2016-05-13 VITALS — BP 99/49 | HR 63 | Temp 97.7°F | Resp 16

## 2016-05-13 DIAGNOSIS — D509 Iron deficiency anemia, unspecified: Secondary | ICD-10-CM

## 2016-05-13 DIAGNOSIS — D508 Other iron deficiency anemias: Secondary | ICD-10-CM

## 2016-05-13 MED ORDER — SODIUM CHLORIDE 0.9 % IV SOLN
Freq: Once | INTRAVENOUS | Status: AC
  Administered 2016-05-13: 15:00:00 via INTRAVENOUS

## 2016-05-13 MED ORDER — SODIUM CHLORIDE 0.9 % IV SOLN
510.0000 mg | Freq: Once | INTRAVENOUS | Status: AC
  Administered 2016-05-13: 510 mg via INTRAVENOUS
  Filled 2016-05-13: qty 17

## 2016-05-13 NOTE — Progress Notes (Signed)
Patient tolerated infusion well.  VSS.  Patient ambulatory and stable upon discharge from clinic.   

## 2016-05-13 NOTE — Patient Instructions (Signed)
[facility] at [facility] Discharge Instructions  RECOMMENDATIONS MADE BY THE CONSULTANT AND ANY TEST RESULTS WILL BE SENT TO YOUR REFERRING PHYSICIAN.  IV iron today.    Thank you for choosing [facility] at [facility] to provide your oncology and hematology care.  To afford each patient quality time with our provider, please arrive at least 15 minutes before your scheduled appointment time.    If you have a lab appointment with the Cancer Center please come in thru the  Main Entrance and check in at the main information desk  You need to re-schedule your appointment should you arrive 10 or more minutes late.  We strive to give you quality time with our providers, and arriving late affects you and other patients whose appointments are after yours.  Also, if you no show three or more times for appointments you may be dismissed from the clinic at the providers discretion.     Again, thank you for choosing [facility].  Our hope is that these requests will decrease the amount of time that you wait before being seen by our physicians.       _____________________________________________________________  Should you have questions after your visit to [facility], please contact our office at [phone] between the hours of 8:30 a.m. and 4:30 p.m.  Voicemails left after 4:30 p.m. will not be returned until the following business day.  For prescription refill requests, have your pharmacy contact our office.       Resources For Cancer Patients and their Caregivers ? American Cancer Society: Can assist with transportation, wigs, general needs, runs Look Good Feel Better.        1-888-227-6333 ? Cancer Care: Provides financial assistance, online support groups, medication/co-pay assistance.  1-800-813-HOPE (4673) ? [facility] Assists [place] cancer patients and their families through emotional  , educational and financial support.  336-427-4357 ? [place] DSS Where to apply for food stamps, Medicaid and utility assistance. 336-342-1394 ? RCATS: Transportation to medical appointments. 336-347-2287 ? Social Security Administration: May apply for disability if have a Stage IV cancer. 336-342-7796 1-800-772-1213 ? [place] Aging, Disability and Transit Services: Assists with nutrition, care and transit needs. 336-349-2343  Cancer Center Support Programs: @10RELATIVEDAYS@ > Cancer Support Group  2nd Tuesday of the month 1pm-2pm, Journey Room  > Creative Journey  3rd Tuesday of the month 1130am-1pm, Journey Room  > Look Good Feel Better  1st Wednesday of the month 10am-12 noon, Journey Room (Call American [employer] to register 1-800-395-5775)    

## 2016-06-09 ENCOUNTER — Encounter (HOSPITAL_COMMUNITY): Payer: BLUE CROSS/BLUE SHIELD

## 2016-06-09 ENCOUNTER — Encounter (HOSPITAL_COMMUNITY): Payer: BLUE CROSS/BLUE SHIELD | Attending: Oncology | Admitting: Oncology

## 2016-06-09 ENCOUNTER — Encounter (HOSPITAL_COMMUNITY): Payer: Self-pay

## 2016-06-09 VITALS — BP 114/71 | HR 62 | Temp 97.5°F | Resp 20 | Wt 379.2 lb

## 2016-06-09 DIAGNOSIS — F419 Anxiety disorder, unspecified: Secondary | ICD-10-CM | POA: Diagnosis not present

## 2016-06-09 DIAGNOSIS — Z9049 Acquired absence of other specified parts of digestive tract: Secondary | ICD-10-CM | POA: Insufficient documentation

## 2016-06-09 DIAGNOSIS — D509 Iron deficiency anemia, unspecified: Secondary | ICD-10-CM

## 2016-06-09 DIAGNOSIS — D508 Other iron deficiency anemias: Secondary | ICD-10-CM

## 2016-06-09 DIAGNOSIS — Z808 Family history of malignant neoplasm of other organs or systems: Secondary | ICD-10-CM | POA: Diagnosis not present

## 2016-06-09 DIAGNOSIS — Z9884 Bariatric surgery status: Secondary | ICD-10-CM | POA: Insufficient documentation

## 2016-06-09 DIAGNOSIS — E538 Deficiency of other specified B group vitamins: Secondary | ICD-10-CM | POA: Diagnosis not present

## 2016-06-09 DIAGNOSIS — Z79899 Other long term (current) drug therapy: Secondary | ICD-10-CM | POA: Diagnosis not present

## 2016-06-09 DIAGNOSIS — Z801 Family history of malignant neoplasm of trachea, bronchus and lung: Secondary | ICD-10-CM | POA: Insufficient documentation

## 2016-06-09 LAB — CBC WITH DIFFERENTIAL/PLATELET
BASOS ABS: 0 10*3/uL (ref 0.0–0.1)
BASOS PCT: 0 %
EOS ABS: 0.1 10*3/uL (ref 0.0–0.7)
Eosinophils Relative: 2 %
HCT: 40.5 % (ref 36.0–46.0)
HEMOGLOBIN: 12.9 g/dL (ref 12.0–15.0)
LYMPHS PCT: 32 %
Lymphs Abs: 1.6 10*3/uL (ref 0.7–4.0)
MCH: 31.9 pg (ref 26.0–34.0)
MCHC: 31.9 g/dL (ref 30.0–36.0)
MCV: 100 fL (ref 78.0–100.0)
MONOS PCT: 9 %
Monocytes Absolute: 0.5 10*3/uL (ref 0.1–1.0)
NEUTROS ABS: 2.8 10*3/uL (ref 1.7–7.7)
Neutrophils Relative %: 57 %
Platelets: 218 10*3/uL (ref 150–400)
RBC: 4.05 MIL/uL (ref 3.87–5.11)
RDW: 15.7 % — AB (ref 11.5–15.5)
WBC: 4.9 10*3/uL (ref 4.0–10.5)

## 2016-06-09 LAB — VITAMIN B12: VITAMIN B 12: 264 pg/mL (ref 180–914)

## 2016-06-09 LAB — COMPREHENSIVE METABOLIC PANEL
ALBUMIN: 3.4 g/dL — AB (ref 3.5–5.0)
ALK PHOS: 51 U/L (ref 38–126)
ALT: 14 U/L (ref 14–54)
ANION GAP: 5 (ref 5–15)
AST: 21 U/L (ref 15–41)
BILIRUBIN TOTAL: 0.2 mg/dL — AB (ref 0.3–1.2)
BUN: 12 mg/dL (ref 6–20)
CALCIUM: 8.7 mg/dL — AB (ref 8.9–10.3)
CO2: 28 mmol/L (ref 22–32)
Chloride: 107 mmol/L (ref 101–111)
Creatinine, Ser: 0.71 mg/dL (ref 0.44–1.00)
GFR calc non Af Amer: >60 mL/min (ref >60)
GLUCOSE: 105 mg/dL — AB (ref 65–99)
POTASSIUM: 3.8 mmol/L (ref 3.5–5.1)
SODIUM: 140 mmol/L (ref 135–145)
TOTAL PROTEIN: 6.6 g/dL (ref 6.5–8.1)

## 2016-06-09 LAB — IRON AND TIBC
Iron: 108 ug/dL (ref 28–170)
SATURATION RATIOS: 36 % — AB (ref 10.4–31.8)
TIBC: 301 ug/dL (ref 250–450)
UIBC: 193 ug/dL

## 2016-06-09 MED ORDER — CYANOCOBALAMIN 1000 MCG/ML IJ SOLN: 1000.0000 ug | INTRAMUSCULAR | 5 refills | Status: DC

## 2016-06-09 NOTE — Patient Instructions (Signed)
[facility] at [facility] Discharge Instructions  RECOMMENDATIONS MADE BY THE CONSULTANT AND ANY TEST RESULTS WILL BE SENT TO YOUR REFERRING PHYSICIAN.  You were seen today by Dr. Twana First We will schedule you for one infusion of iron next week Pres[facility] b-12 [facility], please inject once every 2 weeks Follow up in 6 weeks with lab work   See Amy up front for appointments   Thank you for choosing [facility] at [facility] to provide your oncology and hematology care.  To afford each patient quality time with our provider, please arrive at least 15 minutes before your scheduled appointment time.    If you have a lab appointment with the [facility] please come in thru the  Main Entrance and check in at the main information desk  You need to re-schedule your appointment should you arrive 10 or more minutes late.  We strive to give you quality time with our providers, and arriving late affects you and other patients whose appointments are after yours.  Also, if you no show three or more times for appointments you may be dismissed from the clinic at the providers discretion.     Again, thank you for choosing [facility].  Our hope is that these requests will decrease the amount of time that you wait before being seen by our physicians.       _____________________________________________________________  Should you have questions after your visit to [facility], please contact our office at [phone] between the hours of 8:30 a.m. and 4:30 p.m.  Voicemails left after 4:30 p.m. will not be returned until the following business day.  For prescription refill requests, have your pharmacy contact our office.       Resources For Cancer Patients and their Caregivers ? American Cancer Society: Can assist with transportation, wigs, general needs, runs Look Good Feel  Better.        (606)321-2283 ? Cancer Care: Provides financial assistance, online support groups, medication/co-pay assistance.  1-800-813-HOPE 808-086-5263) ? [facility] Assists [place] cancer patients and their families through emotional , educational and financial support.  3527332069 ? [place] DSS Where to apply for food stamps, Medicaid and utility assistance. 267-186-2357 ? RCATS: Transportation to medical appointments. (213)742-4041 ? Social Security Administration: May apply for disability if have a Stage IV cancer. (318) 829-7811 (405)427-0574 ? [employer], Disability and Transit Services: Assists with nutrition, care and transit needs. [place] Support Programs: @10RELATIVEDAYS @ > Cancer Support Group  2nd Tuesday of the month 1pm-2pm, Journey Room  > Creative Journey  3rd Tuesday of the month 1130am-1pm, Journey Room  > Look Good Feel Better  1st Wednesday of the month 10am-12 noon, Journey Room (Call [facility] to register [phone])

## 2016-06-09 NOTE — Progress Notes (Signed)
[facility]  CONSULT NOTE  Patient Care Team: Curlene Labrum, MD as PCP - General  CHIEF COMPLAINTS/PURPOSE OF CONSULTATION:  Anemia  B12 deficiency   HISTORY OF PRESENTING ILLNESS:  Kristy Sharp 51 y.o. female has a history of gastric bypass surgery and is here because of anemia with B12 deficiency. She has previously seen Dr. Jacquiline Doe for her anemia. She has had IV iron in the past and takes B12 supplements.  She had a gastric bypass in 2004, and has been getting IV iron ever since. Her last IV iron was almost 2 years ago; she responded well. She still gets her period, but she wouldn't say that they're heavy, but they do last for 5 days. She craves cold food, but denies pica. She gives herself B12 injections once a month.    Today patient presented for follow up. She received one dose of feraheme on 05/13/16. She still feels very fatigued. She has some SOB with exertion. Denies hematuria, hematochezia, melena, easy bleeding, abdominal pain, or any other concerns. She has chronic knee pain bilaterally.  MEDICAL HISTORY:  Past Medical History:  Diagnosis Date  . Allergic rhinitis due to other allergen   . Anemia   . Anxiety   . Asthma   . Bursitis, trochanteric   . Cellulitis   . Depression   . Headache   . Hypercholesteremia   . Hyperglycemia   . Insomnia   . Obesity   . Sciatica   . Sinusitis   . Sleep apnea   . Vitamin B deficiency   . Vitamin D deficiency     SURGICAL HISTORY: Past Surgical History:  Procedure Laterality Date  . CHOLECYSTECTOMY N/A 04/27/2012   Procedure: LAPAROSCOPIC CHOLECYSTECTOMY;  Surgeon: Donato Heinz, MD;  Location: [facility];  Service: General;  Laterality: N/A;  . CHOLECYSTECTOMY    . EYE SURGERY    . GASTRIC BYPASS    . ORTHOPEDIC SURGERY    . WRIST SURGERY Left     SOCIAL HISTORY: Social History   Social History  . Marital status: Single    Spouse name: N/A  . Number of children: 0  . Years of education:  college   Occupational History  .  [employer]    Call center   Social History Main Topics  . Smoking status: Never Smoker  . Smokeless tobacco: Never Used  . Alcohol use 0.6 oz/week    1 Cans of beer per week     Comment: occas.  . Drug use: No  . Sexual activity: Not on file   Other Topics Concern  . Not on file   Social History Narrative   Patient is single   Patient is right-handed.   Patient works full time at a call center    Education some college   Caffeine coffee two cups daily and some times one soda             FAMILY HISTORY: Family History  Problem Relation Age of Onset  . Lupus Mother   . Heart Problems Mother   . Gout Mother   . Lung cancer Father   . Colon cancer Maternal Grandmother     ALLERGIES:  has No Known Allergies.  MEDICATIONS:  Current Outpatient Prescriptions  Medication Sig Dispense Refill  . albuterol (PROVENTIL HFA;VENTOLIN HFA) 108 (90 BASE) MCG/ACT inhaler Inhale 2 puffs into the lungs every 6 (six) hours as needed for wheezing.    Marland Kitchen buPROPion (WELLBUTRIN) 100 MG  tablet Take 100 mg by mouth daily.    . Cholecalciferol (VITAMIN D PO) Take 1 capsule by mouth at bedtime.    . citalopram (CELEXA) 40 MG tablet Take 40 mg by mouth daily.    . cyanocobalamin (,VITAMIN B-12,) 1000 MCG/ML injection Inject 1,000 mcg into the muscle every 30 (thirty) days.    . diphenhydrAMINE (BENADRYL) 25 mg capsule Take 25 mg by mouth every 6 (six) hours as needed for itching.    Marland Kitchen FOLIC ACID PO Take 1 tablet by mouth daily.    Marland Kitchen ibuprofen (ADVIL,MOTRIN) 200 MG tablet Take by mouth every 6 (six) hours as needed for pain.    Marland Kitchen LORazepam (ATIVAN) 1 MG tablet Take 1 mg by mouth 3 (three) times daily as needed.    . meloxicam (MOBIC) 15 MG tablet Take 15 mg by mouth at bedtime.    . montelukast (SINGULAIR) 10 MG tablet Take 10 mg by mouth at bedtime.    . traZODone (DESYREL) 50 MG tablet Take 100 mg by mouth at bedtime as needed for sleep.    Marland Kitchen  triamcinolone cream (KENALOG) 0.1 % Apply topically 2 (two) times daily.    . cyanocobalamin (,VITAMIN B-12,) 1000 MCG/ML injection Inject 1 mL (1,000 mcg total) into the muscle every 14 (fourteen) days. 1 mL 5   No current facility-administered medications for this visit.    Review of Systems  Constitutional: Positive for malaise/fatigue.       No pica  HENT: Negative.   Eyes: Negative.   Respiratory: Positive for shortness of breath.   Cardiovascular: Negative.   Gastrointestinal: Negative.  Negative for abdominal pain, blood in stool and melena.  Genitourinary: Negative.  Negative for hematuria.  Musculoskeletal: Negative.   Skin: Negative.   Neurological: Negative for dizziness.  Endo/Heme/Allergies: Negative.  Does not bruise/bleed easily.  Psychiatric/Behavioral: Negative.   All other systems reviewed and are negative. 14 point ROS was done and is otherwise as detailed above or in HPI  PHYSICAL EXAMINATION: ECOG PERFORMANCE STATUS: 1 - Symptomatic but completely ambulatory  Vitals:   06/09/16 1451  BP: 114/71  Pulse: 62  Resp: 20  Temp: 97.5 F (36.4 C)   Filed Weights   06/09/16 1451  Weight: (!) 379 lb 3.2 oz (172 kg)   Physical Exam  Constitutional: She is oriented to person, place, and time and well-developed, well-nourished, and in no distress.  Morbidly obese female  HENT:  Head: Normocephalic and atraumatic.  Eyes: EOM are normal. Pupils are equal, round, and reactive to light.  Neck: Normal range of motion. Neck supple.  Cardiovascular: Normal rate, regular rhythm and normal heart sounds.   Pulmonary/Chest: Effort normal and breath sounds normal.  Abdominal: Soft. Bowel sounds are normal.  Musculoskeletal: Normal range of motion.  Neurological: She is alert and oriented to person, place, and time. Gait normal.  Skin: Skin is warm and dry.  Nursing note and vitals reviewed.  LABORATORY DATA:  I have reviewed the data as listed Lab Results  Component  Value Date   WBC 4.9 06/09/2016   HGB 12.9 06/09/2016   HCT 40.5 06/09/2016   MCV 100.0 06/09/2016   PLT 218 06/09/2016  Results for DAWN, CONVERY (MRN [id number]) as of 06/09/2016 14:54  Ref. Range 04/27/2016 09:10  Vitamin B12 Latest Ref Range: 180 - 914 pg/mL 233  Results for DYANN, GOODSPEED (MRN [id number]) as of 06/09/2016 14:54  Ref. Range 04/27/2016 09:10  Iron Latest Ref Range: 28 - 170 ug/dL 105  UIBC Latest Units: ug/dL 272  TIBC Latest Ref Range: 250 - 450 ug/dL 377  Saturation Ratios Latest Ref Range: 10.4 - 31.8 % 28  Ferritin Latest Ref Range: 11 - 307 ng/mL 8 (L)  Folate Latest Ref Range: >5.9 ng/mL 21.6   CMP     Component Value Date/Time   NA 140 06/09/2016 1403   K 3.8 06/09/2016 1403   CL 107 06/09/2016 1403   CO2 28 06/09/2016 1403   GLUCOSE 105 (H) 06/09/2016 1403   BUN 12 06/09/2016 1403   CREATININE 0.71 06/09/2016 1403   CALCIUM 8.7 (L) 06/09/2016 1403   PROT 6.6 06/09/2016 1403   ALBUMIN 3.4 (L) 06/09/2016 1403   AST 21 06/09/2016 1403   ALT 14 06/09/2016 1403   ALKPHOS 51 06/09/2016 1403   BILITOT 0.2 (L) 06/09/2016 1403   GFRNONAA >60 06/09/2016 1403   GFRAA >60 06/09/2016 1403   RADIOGRAPHIC STUDIES: I have personally reviewed the radiological images as listed and agreed with the findings in the report. No results found.  ASSESSMENT & PLAN:  Iron deficiency anemia and Vitamin B12 deficiency likely due to malabsorption after gastric bypass. She received one dose of feraheme on 05/13/16.   PLAN: Given how low patient's ferritin was, it is unlikely that she has her iron stores replenished after one dose of feraheme. I will set her up for a second feraheme dose next week. B12 levels are still low despite monthly B12 injections, I will change her to B12 injections q2weeks. RTC in 6 weeks for follow up with labs.  ORDERS PLACED FOR THIS ENCOUNTER: Orders Placed This Encounter  Procedures  . CBC with Differential  . Comprehensive metabolic panel  .  Folate  . Vitamin B12  . Ferritin  . Iron and TIBC    MEDICATIONS PRESCRIBED THIS ENCOUNTER: Meds ordered this encounter  Medications  . meloxicam (MOBIC) 15 MG tablet    Sig: Take 15 mg by mouth at bedtime.  . cyanocobalamin (,VITAMIN B-12,) 1000 MCG/ML injection    Sig: Inject 1 mL (1,000 mcg total) into the muscle every 14 (fourteen) days.    Dispense:  1 mL    Refill:  5   All questions were answered. The patient knows to call the clinic with any problems, questions or concerns.  This document serves as a record of services personally performed by Twana First, MD. It was created on her behalf by Martinique Casey, a trained medical scribe. The creation of this record is based on the scribe's personal observations and the provider's statements to them. This document has been checked and approved by the attending provider.  I have reviewed the above documentation for accuracy and completeness and I agree with the above.  This note was electronically signed.    Twana First, MD  06/09/2016 3:28 PM

## 2016-06-14 ENCOUNTER — Encounter (HOSPITAL_BASED_OUTPATIENT_CLINIC_OR_DEPARTMENT_OTHER): Payer: BLUE CROSS/BLUE SHIELD

## 2016-06-14 ENCOUNTER — Encounter (HOSPITAL_COMMUNITY): Payer: Self-pay

## 2016-06-14 VITALS — BP 117/60 | HR 56 | Temp 98.3°F | Resp 18

## 2016-06-14 DIAGNOSIS — D509 Iron deficiency anemia, unspecified: Secondary | ICD-10-CM | POA: Diagnosis not present

## 2016-06-14 DIAGNOSIS — D508 Other iron deficiency anemias: Secondary | ICD-10-CM

## 2016-06-14 MED ORDER — SODIUM CHLORIDE 0.9 % IV SOLN
510.0000 mg | Freq: Once | INTRAVENOUS | Status: AC
  Administered 2016-06-14: 510 mg via INTRAVENOUS
  Filled 2016-06-14: qty 17

## 2016-06-14 MED ORDER — SODIUM CHLORIDE 0.9 % IV SOLN
Freq: Once | INTRAVENOUS | Status: AC
  Administered 2016-06-14: 14:00:00 via INTRAVENOUS

## 2016-06-14 NOTE — Progress Notes (Signed)
Tolerated infusion w/o adverse reaction.  Alert, in no distress.  VSS.  Discharged ambulatory.  

## 2016-06-14 NOTE — Patient Instructions (Signed)
[facility] at [facility] Discharge Instructions  RECOMMENDATIONS MADE BY THE CONSULTANT AND ANY TEST RESULTS WILL BE SENT TO YOUR REFERRING PHYSICIAN.  Iron infusion today. Return as scheduled for lab work and office visit.   Thank you for choosing [facility] at [facility] to provide your oncology and hematology care.  To afford each patient quality time with our provider, please arrive at least 15 minutes before your scheduled appointment time.    If you have a lab appointment with the Cancer Center please come in thru the  Main Entrance and check in at the main information desk  You need to re-schedule your appointment should you arrive 10 or more minutes late.  We strive to give you quality time with our providers, and arriving late affects you and other patients whose appointments are after yours.  Also, if you no show three or more times for appointments you may be dismissed from the clinic at the providers discretion.     Again, thank you for choosing [facility].  Our hope is that these requests will decrease the amount of time that you wait before being seen by our physicians.       _____________________________________________________________  Should you have questions after your visit to [facility], please contact our office at [phone] between the hours of 8:30 a.m. and 4:30 p.m.  Voicemails left after 4:30 p.m. will not be returned until the following business day.  For prescription refill requests, have your pharmacy contact our office.       Resources For Cancer Patients and their Caregivers ? American Cancer Society: Can assist with transportation, wigs, general needs, runs Look Good Feel Better.        1-888-227-6333 ? Cancer Care: Provides financial assistance, online support groups, medication/co-pay assistance.  1-800-813-HOPE (4673) ? [facility] Assists  [place] cancer patients and their families through emotional , educational and financial support.  336-427-4357 ? [place] DSS Where to apply for food stamps, Medicaid and utility assistance. 336-342-1394 ? RCATS: Transportation to medical appointments. 336-347-2287 ? Social Security Administration: May apply for disability if have a Stage IV cancer. 336-342-7796 1-800-772-1213 ? [place] Aging, Disability and Transit Services: Assists with nutrition, care and transit needs. 336-349-2343  Cancer Center Support Programs: [email or web address]@ > Cancer Support Group  2nd Tuesday of the month 1pm-2pm, Journey Room  > Creative Journey  3rd Tuesday of the month 1130am-1pm, Journey Room  > Look Good Feel Better  1st Wednesday of the month 10am-12 noon, Journey Room (Call American Cancer Society to register 1-800-395-5775)   

## 2016-07-20 ENCOUNTER — Ambulatory Visit (HOSPITAL_COMMUNITY): Payer: BLUE CROSS/BLUE SHIELD | Admitting: Oncology

## 2016-07-20 ENCOUNTER — Other Ambulatory Visit (HOSPITAL_COMMUNITY): Payer: BLUE CROSS/BLUE SHIELD

## 2016-07-26 ENCOUNTER — Encounter (HOSPITAL_COMMUNITY): Payer: Self-pay

## 2016-07-26 ENCOUNTER — Encounter (HOSPITAL_BASED_OUTPATIENT_CLINIC_OR_DEPARTMENT_OTHER): Payer: BLUE CROSS/BLUE SHIELD | Admitting: Oncology

## 2016-07-26 ENCOUNTER — Encounter (HOSPITAL_COMMUNITY): Payer: BLUE CROSS/BLUE SHIELD | Attending: Oncology

## 2016-07-26 VITALS — BP 123/82 | HR 69 | Temp 97.7°F | Resp 20 | Wt 371.3 lb

## 2016-07-26 DIAGNOSIS — K909 Intestinal malabsorption, unspecified: Secondary | ICD-10-CM | POA: Diagnosis not present

## 2016-07-26 DIAGNOSIS — E538 Deficiency of other specified B group vitamins: Secondary | ICD-10-CM

## 2016-07-26 DIAGNOSIS — R5382 Chronic fatigue, unspecified: Secondary | ICD-10-CM | POA: Diagnosis not present

## 2016-07-26 DIAGNOSIS — D509 Iron deficiency anemia, unspecified: Secondary | ICD-10-CM | POA: Diagnosis not present

## 2016-07-26 DIAGNOSIS — Z9884 Bariatric surgery status: Secondary | ICD-10-CM | POA: Diagnosis not present

## 2016-07-26 DIAGNOSIS — D508 Other iron deficiency anemias: Secondary | ICD-10-CM

## 2016-07-26 LAB — COMPREHENSIVE METABOLIC PANEL
ALBUMIN: 3.6 g/dL (ref 3.5–5.0)
ALT: 13 U/L — ABNORMAL LOW (ref 14–54)
ANION GAP: 7 (ref 5–15)
AST: 19 U/L (ref 15–41)
Alkaline Phosphatase: 47 U/L (ref 38–126)
BILIRUBIN TOTAL: 0.5 mg/dL (ref 0.3–1.2)
BUN: 14 mg/dL (ref 6–20)
CO2: 26 mmol/L (ref 22–32)
Calcium: 8.9 mg/dL (ref 8.9–10.3)
Chloride: 106 mmol/L (ref 101–111)
Creatinine, Ser: 0.74 mg/dL (ref 0.44–1.00)
GFR calc Af Amer: >60 mL/min (ref >60)
GFR calc non Af Amer: >60 mL/min (ref >60)
GLUCOSE: 102 mg/dL — AB (ref 65–99)
POTASSIUM: 4 mmol/L (ref 3.5–5.1)
Sodium: 139 mmol/L (ref 135–145)
TOTAL PROTEIN: 7 g/dL (ref 6.5–8.1)

## 2016-07-26 LAB — FERRITIN: FERRITIN: 82 ng/mL (ref 11–307)

## 2016-07-26 LAB — FOLATE: FOLATE: 17.9 ng/mL (ref >5.9)

## 2016-07-26 LAB — CBC WITH DIFFERENTIAL/PLATELET
BASOS PCT: 0 %
Basophils Absolute: 0 10*3/uL (ref 0.0–0.1)
EOS ABS: 0.1 10*3/uL (ref 0.0–0.7)
Eosinophils Relative: 2 %
HEMATOCRIT: 41.9 % (ref 36.0–46.0)
Hemoglobin: 13.3 g/dL (ref 12.0–15.0)
Lymphocytes Relative: 30 %
Lymphs Abs: 1.5 10*3/uL (ref 0.7–4.0)
MCH: 32 pg (ref 26.0–34.0)
MCHC: 31.7 g/dL (ref 30.0–36.0)
MCV: 100.7 fL — ABNORMAL HIGH (ref 78.0–100.0)
MONO ABS: 0.4 10*3/uL (ref 0.1–1.0)
MONOS PCT: 9 %
NEUTROS ABS: 2.9 10*3/uL (ref 1.7–7.7)
Neutrophils Relative %: 59 %
Platelets: 222 10*3/uL (ref 150–400)
RBC: 4.16 MIL/uL (ref 3.87–5.11)
RDW: 14.4 % (ref 11.5–15.5)
WBC: 4.9 10*3/uL (ref 4.0–10.5)

## 2016-07-26 LAB — IRON AND TIBC
IRON: 111 ug/dL (ref 28–170)
Saturation Ratios: 42 % — ABNORMAL HIGH (ref 10.4–31.8)
TIBC: 267 ug/dL (ref 250–450)
UIBC: 156 ug/dL

## 2016-07-26 LAB — VITAMIN B12: VITAMIN B 12: 648 pg/mL (ref 180–914)

## 2016-07-26 NOTE — Progress Notes (Signed)
Kristy Sharp  CONSULT NOTE  Patient Care Team: Pleas Koch Virgina Evener, MD as PCP - General  CHIEF COMPLAINTS/PURPOSE OF CONSULTATION:  Anemia  B12 deficiency   HISTORY OF PRESENTING ILLNESS:  Kristy Sharp 51 y.o. female has a history of gastric bypass surgery and is here because of anemia with B12 deficiency. She has previously seen Dr. Jacquiline Doe for her anemia. She has had IV iron in the past and takes B12 supplements.  She had a gastric bypass in 2004, and has been getting IV iron ever since. Her last IV iron was almost 2 years ago; she responded well. She still gets her period, but she wouldn't say that they're heavy, but they do last for 5 days. She craves cold food, but denies pica. She gives herself B12 injections twice a month.    Patient presented today for continued follow-up of her anemia. She's had a dose of Feraheme on 05/13/16 and 06/14/16. Her hemoglobin today has improved up to 13.3 g/dL. Her iron studies are pending at this time. Patient states she continues to feel fatigued despite having received Feraheme. She states she went to see an endocrinologist in [place] recently and was told that she had a borderline Hashimoto's, and she does not note this is contributing to her fatigue. Other than fatigue, she has no new complaints today. She states that she does sleep relatively well. She states that she's been told that she snores at night, but denies frequent awakenings at night time. She has some SOB with exertion. Denies hematuria, hematochezia, melena, easy bleeding, abdominal pain, or any other concerns.   MEDICAL HISTORY:  Past Medical History:  Diagnosis Date  . Allergic rhinitis due to other allergen   . Anemia   . Anxiety   . Asthma   . Bursitis, trochanteric   . Cellulitis   . Depression   . Headache   . Hypercholesteremia   . Hyperglycemia   . Insomnia   . Obesity   . Sciatica   . Sinusitis   . Sleep apnea   . Vitamin B deficiency   . Vitamin  D deficiency     SURGICAL HISTORY: Past Surgical History:  Procedure Laterality Date  . CHOLECYSTECTOMY N/A 04/27/2012   Procedure: LAPAROSCOPIC CHOLECYSTECTOMY;  Surgeon: Donato Heinz, MD;  Location: [facility];  Service: General;  Laterality: N/A;  . CHOLECYSTECTOMY    . EYE SURGERY    . GASTRIC BYPASS    . ORTHOPEDIC SURGERY    . WRIST SURGERY Left     SOCIAL HISTORY: Social History   Social History  . Marital status: Single    Spouse name: N/A  . Number of children: 0  . Years of education: college   Occupational History  .  [employer]    Call center   Social History Main Topics  . Smoking status: Never Smoker  . Smokeless tobacco: Never Used  . Alcohol use 0.6 oz/week    1 Cans of beer per week     Comment: occas.  . Drug use: No  . Sexual activity: Not on file   Other Topics Concern  . Not on file   Social History Narrative   Patient is single   Patient is right-handed.   Patient works full time at a call center    Education some college   Caffeine coffee two cups daily and some times one soda             FAMILY HISTORY:  Family History  Problem Relation Age of Onset  . Lupus Mother   . Heart Problems Mother   . Gout Mother   . Lung cancer Father   . Colon cancer Maternal Grandmother     ALLERGIES:  has No Known Allergies.  MEDICATIONS:  Current Outpatient Prescriptions  Medication Sig Dispense Refill  . albuterol (PROVENTIL HFA;VENTOLIN HFA) 108 (90 BASE) MCG/ACT inhaler Inhale 2 puffs into the lungs every 6 (six) hours as needed for wheezing.    Marland Kitchen buPROPion (WELLBUTRIN) 100 MG tablet Take 100 mg by mouth daily.    . Cholecalciferol (VITAMIN D PO) Take 1 capsule by mouth at bedtime.    . citalopram (CELEXA) 40 MG tablet Take 40 mg by mouth daily.    . cyanocobalamin (,VITAMIN B-12,) 1000 MCG/ML injection Inject 1,000 mcg into the muscle every 30 (thirty) days.    . cyanocobalamin (,VITAMIN B-12,) 1000 MCG/ML injection Inject 1 mL (1,000 mcg  total) into the muscle every 14 (fourteen) days. 1 mL 5  . diphenhydrAMINE (BENADRYL) 25 mg capsule Take 25 mg by mouth every 6 (six) hours as needed for itching.    Marland Kitchen FOLIC ACID PO Take 1 tablet by mouth daily.    Marland Kitchen ibuprofen (ADVIL,MOTRIN) 200 MG tablet Take by mouth every 6 (six) hours as needed for pain.    Marland Kitchen LORazepam (ATIVAN) 1 MG tablet Take 1 mg by mouth 3 (three) times daily as needed.    . meloxicam (MOBIC) 15 MG tablet Take 15 mg by mouth at bedtime.    . montelukast (SINGULAIR) 10 MG tablet Take 10 mg by mouth at bedtime.    . traZODone (DESYREL) 50 MG tablet Take 100 mg by mouth at bedtime as needed for sleep.    Marland Kitchen triamcinolone cream (KENALOG) 0.1 % Apply topically as needed.      No current facility-administered medications for this visit.    Review of Systems  Constitutional: Positive for malaise/fatigue.       No pica  HENT: Negative.   Eyes: Negative.   Respiratory: Negative for shortness of breath.   Cardiovascular: Negative.   Gastrointestinal: Negative.  Negative for abdominal pain, blood in stool and melena.  Genitourinary: Negative.  Negative for hematuria.  Musculoskeletal: Negative.   Skin: Negative.   Neurological: Negative for dizziness.  Endo/Heme/Allergies: Negative.  Does not bruise/bleed easily.  Psychiatric/Behavioral: Negative.   All other systems reviewed and are negative. 14 point ROS was done and is otherwise as detailed above or in HPI  PHYSICAL EXAMINATION: ECOG PERFORMANCE STATUS: 1 - Symptomatic but completely ambulatory  Vitals:   07/26/16 1409  BP: 123/82  Pulse: 69  Resp: 20  Temp: 97.7 F (36.5 C)   Filed Weights   07/26/16 1409  Weight: (!) 371 lb 4.8 oz (168.4 kg)   Physical Exam  Constitutional: She is oriented to person, place, and time and well-developed, well-nourished, and in no distress. No distress.  Morbidly obese female  HENT:  Head: Normocephalic and atraumatic.  Mouth/Throat: No oropharyngeal exudate.  Eyes:  Conjunctivae and EOM are normal. Pupils are equal, round, and reactive to light. No scleral icterus.  Neck: Normal range of motion. Neck supple. No JVD present.  Cardiovascular: Normal rate, regular rhythm and normal heart sounds.  Exam reveals no gallop and no friction rub.   No murmur heard. Pulmonary/Chest: Effort normal and breath sounds normal. No respiratory distress. She has no wheezes. She has no rales.  Abdominal: Soft. Bowel sounds are normal. She exhibits  no distension. There is no tenderness. There is no guarding.  Musculoskeletal: Normal range of motion. She exhibits no edema or tenderness.  Lymphadenopathy:    She has no cervical adenopathy.  Neurological: She is alert and oriented to person, place, and time. No cranial nerve deficit. Gait normal.  Skin: Skin is warm and dry. No rash noted. No erythema. No pallor.  Psychiatric: Affect and judgment normal.  Nursing note and vitals reviewed.  LABORATORY DATA:  I have reviewed the data as listed Lab Results  Component Value Date   WBC 4.9 07/26/2016   HGB 13.3 07/26/2016   HCT 41.9 07/26/2016   MCV 100.7 (H) 07/26/2016   PLT 222 07/26/2016   CMP     Component Value Date/Time   NA 139 07/26/2016 1332   K 4.0 07/26/2016 1332   CL 106 07/26/2016 1332   CO2 26 07/26/2016 1332   GLUCOSE 102 (H) 07/26/2016 1332   BUN 14 07/26/2016 1332   CREATININE 0.74 07/26/2016 1332   CALCIUM 8.9 07/26/2016 1332   PROT 7.0 07/26/2016 1332   ALBUMIN 3.6 07/26/2016 1332   AST 19 07/26/2016 1332   ALT 13 (L) 07/26/2016 1332   ALKPHOS 47 07/26/2016 1332   BILITOT 0.5 07/26/2016 1332   GFRNONAA >60 07/26/2016 1332   GFRAA >60 07/26/2016 1332   RADIOGRAPHIC STUDIES: I have personally reviewed the radiological images as listed and agreed with the findings in the report. No results found.  ASSESSMENT & PLAN:  Iron deficiency anemia and Vitamin B12 deficiency likely due to malabsorption after gastric bypass. She received one dose of  feraheme on 05/13/16.   PLAN: Reviewed CBC with patient today. Her hemoglobin has improved. Her iron studies are still pending at this time. If her ferritin is below 100, I will plan to give her some more IV iron.  I do not believe her chronic fatigue is secondary to her anemia since her anemia has improved but her fatigue is unchanged. I will refer her to Dr. Dorris Fetch in town for a second opinion on her borderline hashimoto's per patient's wishes.  It may be worthwhile to pursue a sleep study if patient's fatigue continues. RTC in 6 weeks for repeat labs and in 3 months for follow up with labs.  ORDERS PLACED FOR THIS ENCOUNTER: Orders Placed This Encounter  Procedures  . CBC with Differential  . Comprehensive metabolic panel  . Iron and TIBC  . Ferritin  . Vitamin B12    MEDICATIONS PRESCRIBED THIS ENCOUNTER: No orders of the defined types were placed in this encounter.  All questions were answered. The patient knows to call the clinic with any problems, questions or concerns.     Twana First, MD  07/26/2016 2:44 PM

## 2016-07-26 NOTE — Patient Instructions (Signed)
[facility] at [facility] Discharge Instructions  RECOMMENDATIONS MADE BY THE CONSULTANT AND ANY TEST RESULTS WILL BE SENT TO YOUR REFERRING PHYSICIAN.  You saw Dr. Zhou today.  Thank you for choosing  [facility] at [facility] to provide your oncology and hematology care.  To afford each patient quality time with our provider, please arrive at least 15 minutes before your scheduled appointment time.    If you have a lab appointment with the Cancer Center please come in thru the  Main Entrance and check in at the main information desk  You need to re-schedule your appointment should you arrive 10 or more minutes late.  We strive to give you quality time with our providers, and arriving late affects you and other patients whose appointments are after yours.  Also, if you no show three or more times for appointments you may be dismissed from the clinic at the providers discretion.     Again, thank you for choosing [facility].  Our hope is that these requests will decrease the amount of time that you wait before being seen by our physicians.       _____________________________________________________________  Should you have questions after your visit to [facility], please contact our office at [phone] between the hours of 8:30 a.m. and 4:30 p.m.  Voicemails left after 4:30 p.m. will not be returned until the following business day.  For prescription refill requests, have your pharmacy contact our office.       Resources For Cancer Patients and their Caregivers ? American Cancer Society: Can assist with transportation, wigs, general needs, runs Look Good Feel Better.        1-888-227-6333 ? Cancer Care: Provides financial assistance, online support groups, medication/co-pay assistance.  1-800-813-HOPE (4673) ? [facility] Assists [place] cancer patients and their families through  emotional , educational and financial support.  336-427-4357 ? [place] DSS Where to apply for food stamps, Medicaid and utility assistance. 336-342-1394 ? RCATS: Transportation to medical appointments. 336-347-2287 ? Social Security Administration: May apply for disability if have a Stage IV cancer. 336-342-7796 1-800-772-1213 ? [place] Aging, Disability and Transit Services: Assists with nutrition, care and transit needs. 336-349-2343  Cancer Center Support Programs: @10RELATIVEDAYS@ > Cancer Support Group  2nd Tuesday of the month 1pm-2pm, Journey Room  > Creative Journey  3rd Tuesday of the month 1130am-1pm, Journey Room  > Look Good Feel Better  1st Wednesday of the month 10am-12 noon, Journey Room (Call American Cancer Society to register 1-800-395-5775)    

## 2016-08-17 ENCOUNTER — Telehealth (HOSPITAL_COMMUNITY): Payer: Self-pay

## 2016-08-17 NOTE — Telephone Encounter (Signed)
Patient called for lab results. Reviewed with Dr. Talbert Cage prior to returning call. Per Dr. Talbert Cage, patient does not need Iron infusion at this time. Recheck labs at already scheduled time (09/06/16). Patient verbalized understanding.

## 2016-09-05 ENCOUNTER — Encounter: Payer: Self-pay | Admitting: "Endocrinology

## 2016-09-06 ENCOUNTER — Encounter (HOSPITAL_COMMUNITY): Payer: BLUE CROSS/BLUE SHIELD | Attending: Oncology

## 2016-09-06 DIAGNOSIS — E538 Deficiency of other specified B group vitamins: Secondary | ICD-10-CM | POA: Insufficient documentation

## 2016-09-06 DIAGNOSIS — D508 Other iron deficiency anemias: Secondary | ICD-10-CM

## 2016-09-06 LAB — CBC WITH DIFFERENTIAL/PLATELET
BASOS PCT: 1 %
Basophils Absolute: 0 10*3/uL (ref 0.0–0.1)
EOS ABS: 0.1 10*3/uL (ref 0.0–0.7)
Eosinophils Relative: 2 %
HCT: 41.4 % (ref 36.0–46.0)
HEMOGLOBIN: 13.5 g/dL (ref 12.0–15.0)
Lymphocytes Relative: 29 %
Lymphs Abs: 1.7 10*3/uL (ref 0.7–4.0)
MCH: 32.8 pg (ref 26.0–34.0)
MCHC: 32.6 g/dL (ref 30.0–36.0)
MCV: 100.7 fL — ABNORMAL HIGH (ref 78.0–100.0)
Monocytes Absolute: 0.4 10*3/uL (ref 0.1–1.0)
Monocytes Relative: 7 %
NEUTROS ABS: 3.6 10*3/uL (ref 1.7–7.7)
NEUTROS PCT: 61 %
Platelets: 220 10*3/uL (ref 150–400)
RBC: 4.11 MIL/uL (ref 3.87–5.11)
RDW: 14.1 % (ref 11.5–15.5)
WBC: 5.9 10*3/uL (ref 4.0–10.5)

## 2016-09-06 LAB — IRON AND TIBC
Iron: 77 ug/dL (ref 28–170)
SATURATION RATIOS: 28 % (ref 10.4–31.8)
TIBC: 272 ug/dL (ref 250–450)
UIBC: 195 ug/dL

## 2016-09-06 LAB — COMPREHENSIVE METABOLIC PANEL
ALBUMIN: 3.6 g/dL (ref 3.5–5.0)
ALK PHOS: 50 U/L (ref 38–126)
ALT: 14 U/L (ref 14–54)
AST: 19 U/L (ref 15–41)
Anion gap: 7 (ref 5–15)
BILIRUBIN TOTAL: 0.4 mg/dL (ref 0.3–1.2)
BUN: 16 mg/dL (ref 6–20)
CALCIUM: 8.8 mg/dL — AB (ref 8.9–10.3)
CO2: 25 mmol/L (ref 22–32)
CREATININE: 0.77 mg/dL (ref 0.44–1.00)
Chloride: 104 mmol/L (ref 101–111)
GFR calc Af Amer: >60 mL/min (ref >60)
GFR calc non Af Amer: >60 mL/min (ref >60)
GLUCOSE: 101 mg/dL — AB (ref 65–99)
Potassium: 4 mmol/L (ref 3.5–5.1)
SODIUM: 136 mmol/L (ref 135–145)
Total Protein: 6.6 g/dL (ref 6.5–8.1)

## 2016-09-06 LAB — FERRITIN: Ferritin: 61 ng/mL (ref 11–307)

## 2016-09-06 LAB — VITAMIN B12: VITAMIN B 12: 435 pg/mL (ref 180–914)

## 2016-10-18 ENCOUNTER — Ambulatory Visit (HOSPITAL_COMMUNITY): Payer: BLUE CROSS/BLUE SHIELD

## 2016-10-18 ENCOUNTER — Other Ambulatory Visit (HOSPITAL_COMMUNITY): Payer: BLUE CROSS/BLUE SHIELD

## 2016-11-09 ENCOUNTER — Other Ambulatory Visit (HOSPITAL_COMMUNITY): Payer: BLUE CROSS/BLUE SHIELD

## 2016-11-09 ENCOUNTER — Ambulatory Visit (HOSPITAL_COMMUNITY): Payer: BLUE CROSS/BLUE SHIELD

## 2017-01-17 DIAGNOSIS — M9903 Segmental and somatic dysfunction of lumbar region: Secondary | ICD-10-CM | POA: Diagnosis not present

## 2017-01-17 DIAGNOSIS — M545 Low back pain: Secondary | ICD-10-CM | POA: Diagnosis not present

## 2017-01-17 DIAGNOSIS — M546 Pain in thoracic spine: Secondary | ICD-10-CM | POA: Diagnosis not present

## 2017-01-17 DIAGNOSIS — M9902 Segmental and somatic dysfunction of thoracic region: Secondary | ICD-10-CM | POA: Diagnosis not present

## 2017-01-19 DIAGNOSIS — M9903 Segmental and somatic dysfunction of lumbar region: Secondary | ICD-10-CM | POA: Diagnosis not present

## 2017-01-19 DIAGNOSIS — M545 Low back pain: Secondary | ICD-10-CM | POA: Diagnosis not present

## 2017-01-19 DIAGNOSIS — M546 Pain in thoracic spine: Secondary | ICD-10-CM | POA: Diagnosis not present

## 2017-01-19 DIAGNOSIS — M9902 Segmental and somatic dysfunction of thoracic region: Secondary | ICD-10-CM | POA: Diagnosis not present

## 2017-01-24 DIAGNOSIS — M9902 Segmental and somatic dysfunction of thoracic region: Secondary | ICD-10-CM | POA: Diagnosis not present

## 2017-01-24 DIAGNOSIS — M9903 Segmental and somatic dysfunction of lumbar region: Secondary | ICD-10-CM | POA: Diagnosis not present

## 2017-01-24 DIAGNOSIS — M546 Pain in thoracic spine: Secondary | ICD-10-CM | POA: Diagnosis not present

## 2017-01-24 DIAGNOSIS — M545 Low back pain: Secondary | ICD-10-CM | POA: Diagnosis not present

## 2017-04-08 ENCOUNTER — Other Ambulatory Visit (HOSPITAL_COMMUNITY): Payer: Self-pay | Admitting: *Deleted

## 2017-04-08 DIAGNOSIS — D508 Other iron deficiency anemias: Secondary | ICD-10-CM

## 2017-04-11 ENCOUNTER — Inpatient Hospital Stay (HOSPITAL_COMMUNITY): Payer: BLUE CROSS/BLUE SHIELD | Attending: Oncology | Admitting: Oncology

## 2017-04-11 ENCOUNTER — Inpatient Hospital Stay (HOSPITAL_COMMUNITY): Payer: BLUE CROSS/BLUE SHIELD | Attending: Oncology

## 2017-04-11 ENCOUNTER — Encounter (HOSPITAL_COMMUNITY): Payer: Self-pay | Admitting: Oncology

## 2017-04-11 VITALS — BP 105/71 | HR 67 | Temp 98.1°F | Resp 18 | Wt 371.0 lb

## 2017-04-11 DIAGNOSIS — D509 Iron deficiency anemia, unspecified: Secondary | ICD-10-CM | POA: Insufficient documentation

## 2017-04-11 DIAGNOSIS — E538 Deficiency of other specified B group vitamins: Secondary | ICD-10-CM | POA: Insufficient documentation

## 2017-04-11 DIAGNOSIS — D508 Other iron deficiency anemias: Secondary | ICD-10-CM

## 2017-04-11 DIAGNOSIS — Z9884 Bariatric surgery status: Secondary | ICD-10-CM | POA: Diagnosis not present

## 2017-04-11 LAB — COMPREHENSIVE METABOLIC PANEL
ALBUMIN: 3.5 g/dL (ref 3.5–5.0)
ALT: 13 U/L — ABNORMAL LOW (ref 14–54)
ANION GAP: 9 (ref 5–15)
AST: 19 U/L (ref 15–41)
Alkaline Phosphatase: 49 U/L (ref 38–126)
BILIRUBIN TOTAL: 0.6 mg/dL (ref 0.3–1.2)
BUN: 15 mg/dL (ref 6–20)
CHLORIDE: 105 mmol/L (ref 101–111)
CO2: 24 mmol/L (ref 22–32)
Calcium: 8.7 mg/dL — ABNORMAL LOW (ref 8.9–10.3)
Creatinine, Ser: 0.74 mg/dL (ref 0.44–1.00)
GFR calc Af Amer: >60 mL/min (ref >60)
Glucose, Bld: 100 mg/dL — ABNORMAL HIGH (ref 65–99)
POTASSIUM: 3.9 mmol/L (ref 3.5–5.1)
Sodium: 138 mmol/L (ref 135–145)
TOTAL PROTEIN: 6.6 g/dL (ref 6.5–8.1)

## 2017-04-11 LAB — CBC WITH DIFFERENTIAL/PLATELET
BASOS ABS: 0 10*3/uL (ref 0.0–0.1)
BASOS PCT: 0 %
EOS PCT: 2 %
Eosinophils Absolute: 0.1 10*3/uL (ref 0.0–0.7)
HCT: 40.9 % (ref 36.0–46.0)
Hemoglobin: 12.5 g/dL (ref 12.0–15.0)
Lymphocytes Relative: 27 %
Lymphs Abs: 1.6 10*3/uL (ref 0.7–4.0)
MCH: 31.3 pg (ref 26.0–34.0)
MCHC: 30.6 g/dL (ref 30.0–36.0)
MCV: 102.5 fL — AB (ref 78.0–100.0)
MONO ABS: 0.5 10*3/uL (ref 0.1–1.0)
MONOS PCT: 9 %
Neutro Abs: 3.5 10*3/uL (ref 1.7–7.7)
Neutrophils Relative %: 62 %
PLATELETS: 219 10*3/uL (ref 150–400)
RBC: 3.99 MIL/uL (ref 3.87–5.11)
RDW: 14.1 % (ref 11.5–15.5)
WBC: 5.7 10*3/uL (ref 4.0–10.5)

## 2017-04-11 LAB — VITAMIN B12: Vitamin B-12: 179 pg/mL — ABNORMAL LOW (ref 180–914)

## 2017-04-11 LAB — IRON AND TIBC
IRON: 82 ug/dL (ref 28–170)
SATURATION RATIOS: 26 % (ref 10.4–31.8)
TIBC: 311 ug/dL (ref 250–450)
UIBC: 229 ug/dL

## 2017-04-11 LAB — FERRITIN: FERRITIN: 23 ng/mL (ref 11–307)

## 2017-04-11 NOTE — Progress Notes (Signed)
[place]  CONSULT NOTE  Patient Care Team: Curlene Labrum, MD as PCP - General  CHIEF COMPLAINTS/PURPOSE OF CONSULTATION:  Anemia  B12 deficiency   HISTORY OF PRESENTING ILLNESS:  Patient has a history of gastric bypass surgery and is here because of anemia with B12 deficiency. She has had IV Feraheme in the past and is currently taking monthly B12 injections at home. She is status post gastric bypass in 2004 and has been getting IV iron ever since. Her last IV iron was in March and April 2018. She responded well. She denies any active bleeding or heavy menstrual cycles. She does admit to craving cold foods but denies pica.   Patient presents today for continued follow-up for her anemia. Last dose of IV Feraheme was on 05/13/2016 and 06/14/2016. Her hemoglobin today is 12.5. Iron studies are pending at this time. Patient states she continues to feel fatigued and lacks complete energy. Her appetite is 100%. Patient also states she has exertional shortness of breath but relates this to being obese. She has periods of dizziness or lightheadedness when she stands. Otherwise she is feeling okay. She does think she sleeps too much and does not exercise at all. Denies hematuria, hematochezia, melena, easy bleeding, abdominal pain, or any other concerns.   MEDICAL HISTORY:  Past Medical History:  Diagnosis Date  . Allergic rhinitis due to other allergen   . Anemia   . Anxiety   . Asthma   . Bursitis, trochanteric   . Cellulitis   . Depression   . Headache   . Hypercholesteremia   . Hyperglycemia   . Insomnia   . Obesity   . Sciatica   . Sinusitis   . Sleep apnea   . Vitamin B deficiency   . Vitamin D deficiency     SURGICAL HISTORY: Past Surgical History:  Procedure Laterality Date  . CHOLECYSTECTOMY N/A 04/27/2012   Procedure: LAPAROSCOPIC CHOLECYSTECTOMY;  Surgeon: Donato Heinz, MD;  Location: [facility];  Service: General;  Laterality: N/A;  .  CHOLECYSTECTOMY    . EYE SURGERY    . GASTRIC BYPASS    . ORTHOPEDIC SURGERY    . WRIST SURGERY Left     SOCIAL HISTORY: Social History   Socioeconomic History  . Marital status: Single    Spouse name: Not on file  . Number of children: 0  . Years of education: college  . Highest education level: Not on file  Social Needs  . Financial resource strain: Not on file  . Food insecurity - worry: Not on file  . Food insecurity - inability: Not on file  . Transportation needs - medical: Not on file  . Transportation needs - non-medical: Not on file  Occupational History    Employer: [employer]    Comment: Call center  Tobacco Use  . Smoking status: Never Smoker  . Smokeless tobacco: Never Used  Substance and Sexual Activity  . Alcohol use: Yes    Alcohol/week: 0.6 oz    Types: 1 Cans of beer per week    Comment: occas.  . Drug use: No  . Sexual activity: Not on file  Other Topics Concern  . Not on file  Social History Narrative   Patient is single   Patient is right-handed.   Patient works full time at a call center    Education some college   Caffeine coffee two cups daily and some times one soda  FAMILY HISTORY: Family History  Problem Relation Age of Onset  . Lupus Mother   . Heart Problems Mother   . Gout Mother   . Lung cancer Father   . Colon cancer Maternal Grandmother     ALLERGIES:  has No Known Allergies.  MEDICATIONS:  Current Outpatient Medications  Medication Sig Dispense Refill  . albuterol (PROVENTIL HFA;VENTOLIN HFA) 108 (90 BASE) MCG/ACT inhaler Inhale 2 puffs into the lungs every 6 (six) hours as needed for wheezing.    Marland Kitchen buPROPion (WELLBUTRIN) 100 MG tablet Take 100 mg by mouth daily.    . Cholecalciferol (VITAMIN D PO) Take 1 capsule by mouth at bedtime.    . citalopram (CELEXA) 40 MG tablet Take 40 mg by mouth daily.    . cyanocobalamin (,VITAMIN B-12,) 1000 MCG/ML injection Inject 1,000 mcg into the muscle every 30  (thirty) days.    . cyanocobalamin (,VITAMIN B-12,) 1000 MCG/ML injection Inject 1 mL (1,000 mcg total) into the muscle every 14 (fourteen) days. 1 mL 5  . diphenhydrAMINE (BENADRYL) 25 mg capsule Take 25 mg by mouth every 6 (six) hours as needed for itching.    Marland Kitchen FOLIC ACID PO Take 1 tablet by mouth daily.    Marland Kitchen ibuprofen (ADVIL,MOTRIN) 200 MG tablet Take by mouth every 6 (six) hours as needed for pain.    Marland Kitchen LORazepam (ATIVAN) 1 MG tablet Take 1 mg by mouth 3 (three) times daily as needed.    . meloxicam (MOBIC) 15 MG tablet Take 15 mg by mouth at bedtime.    . montelukast (SINGULAIR) 10 MG tablet Take 10 mg by mouth at bedtime.    . traZODone (DESYREL) 50 MG tablet Take 100 mg by mouth at bedtime as needed for sleep.    Marland Kitchen triamcinolone cream (KENALOG) 0.1 % Apply topically as needed.      No current facility-administered medications for this visit.    Review of Systems  Constitutional: Positive for malaise/fatigue. Negative for chills, fever and weight loss.  HENT: Negative for congestion and ear pain.   Eyes: Negative.  Negative for blurred vision and double vision.  Respiratory: Positive for shortness of breath (With exertion). Negative for cough and sputum production.   Cardiovascular: Positive for leg swelling (Chronic). Negative for chest pain and palpitations.  Gastrointestinal: Negative.  Negative for abdominal pain, constipation, diarrhea, nausea and vomiting.  Genitourinary: Negative for dysuria, frequency and urgency.  Musculoskeletal: Negative for back pain and falls.  Skin: Negative.  Negative for rash.  Neurological: Positive for dizziness (When standing on occasion). Negative for weakness and headaches.  Endo/Heme/Allergies: Negative.  Does not bruise/bleed easily.  Psychiatric/Behavioral: Negative.  Negative for depression. The patient is not nervous/anxious and does not have insomnia.   14 point ROS was done and is otherwise as detailed above or in HPI  PHYSICAL  EXAMINATION: ECOG PERFORMANCE STATUS: 1 - Symptomatic but completely ambulatory  Vitals:   04/11/17 1532  BP: 105/71  Pulse: 67  Resp: 18  Temp: 98.1 F (36.7 C)  SpO2: 100%   Filed Weights   04/11/17 1532  Weight: (!) 371 lb (168.3 kg)   Physical Exam  Constitutional: She is oriented to person, place, and time and well-developed, well-nourished, and in no distress. Vital signs are normal.  HENT:  Head: Normocephalic and atraumatic.  Eyes: Pupils are equal, round, and reactive to light.  Neck: Normal range of motion.  Cardiovascular: Normal rate, regular rhythm and normal heart sounds.  No murmur heard. Pulmonary/Chest: Effort  normal and breath sounds normal. She has no wheezes.  Abdominal: Soft. Normal appearance and bowel sounds are normal. She exhibits no distension. There is no tenderness.  Musculoskeletal: Normal range of motion. She exhibits no edema.  Neurological: She is alert and oriented to person, place, and time. Gait normal.  Skin: Skin is warm and dry. No rash noted.  Psychiatric: Mood, memory, affect and judgment normal.   LABORATORY DATA:  I have reviewed the data as listed Lab Results  Component Value Date   WBC 5.7 04/11/2017   HGB 12.5 04/11/2017   HCT 40.9 04/11/2017   MCV 102.5 (H) 04/11/2017   PLT 219 04/11/2017   CMP     Component Value Date/Time   NA 138 04/11/2017 1445   K 3.9 04/11/2017 1445   CL 105 04/11/2017 1445   CO2 24 04/11/2017 1445   GLUCOSE 100 (H) 04/11/2017 1445   BUN 15 04/11/2017 1445   CREATININE 0.74 04/11/2017 1445   CALCIUM 8.7 (L) 04/11/2017 1445   PROT 6.6 04/11/2017 1445   ALBUMIN 3.5 04/11/2017 1445   AST 19 04/11/2017 1445   ALT 13 (L) 04/11/2017 1445   ALKPHOS 49 04/11/2017 1445   BILITOT 0.6 04/11/2017 1445   GFRNONAA >60 04/11/2017 1445   GFRAA >60 04/11/2017 1445   RADIOGRAPHIC STUDIES: I have personally reviewed the radiological images as listed and agreed with the findings in the report. No results  found.  ASSESSMENT & PLAN:  Iron deficiency anemia and Vitamin B12 deficiency likely due to malabsorption after gastric bypass. She received one dose of feraheme on 05/13/16 and one dose on 06/14/16.   PLAN: Reviewed CBC the patient today her hemoglobin has mildly decreased to 12.5 today. Iron studies are pending during dictation. If ferritin is below 100, I will plan to give some more IV iron. We will tentatively set her up for IV Feraheme 2 doses given her hemoglobin has dropped. Chronic fatigue: Dr. Talbert Cage recommended a sleep study last year and patient has not pursued that at this time. Recommended if her fatigue continues after next doses of IV iron this may be a good option. RTC in 3 months for repeat labs and see MD.   Kayleen Memos PLACED FOR THIS ENCOUNTER: No orders of the defined types were placed in this encounter.   MEDICATIONS PRESCRIBED THIS ENCOUNTER: No orders of the defined types were placed in this encounter.  All questions were answered. The patient knows to call the clinic with any problems, questions or concerns.     Jacquelin Hawking, NP  04/11/2017 3:56 PM

## 2017-04-11 NOTE — Patient Instructions (Signed)
[place] at [facility] Discharge Instructions  RECOMMENDATIONS MADE BY THE CONSULTANT AND ANY TEST RESULTS WILL BE SENT TO YOUR REFERRING PHYSICIAN.  You saw Rulon Abide, NP, today See Amy at checkout for appointments.   Thank you for choosing [place] at [facility] to provide your oncology and hematology care.  To afford each patient quality time with our provider, please arrive at least 15 minutes before your scheduled appointment time.    If you have a lab appointment with the [facility] please come in thru the  Main Entrance and check in at the main information desk  You need to re-schedule your appointment should you arrive 10 or more minutes late.  We strive to give you quality time with our providers, and arriving late affects you and other patients whose appointments are after yours.  Also, if you no show three or more times for appointments you may be dismissed from the clinic at the providers discretion.     Again, thank you for choosing [facility].  Our hope is that these requests will decrease the amount of time that you wait before being seen by our physicians.       _____________________________________________________________  Should you have questions after your visit to [facility], please contact our office at [phone] between the hours of 8:30 a.m. and 4:30 p.m.  Voicemails left after 4:30 p.m. will not be returned until the following business day.  For prescription refill requests, have your pharmacy contact our office.       Resources For Cancer Patients and their Caregivers ? American Cancer Society: Can assist with transportation, wigs, general needs, runs Look Good Feel Better.        (610) 125-9973 ? Cancer Care: Provides financial assistance, online support groups, medication/co-pay assistance.  1-800-813-HOPE (631) 278-6095) ? [place] Assists [place] cancer patients and their families through emotional , educational and financial support.  (669)476-2734 ? [place] DSS Where to apply for food stamps, Medicaid and utility assistance. 352-825-2982 ? RCATS: Transportation to medical appointments. 416-088-5541 ? Social Security Administration: May apply for disability if have a Stage IV cancer. 831-443-7908 435-649-5560 ? [employer], Disability and Transit Services: Assists with nutrition, care and transit needs. [place] Support Programs: @10RELATIVEDAYS @ > Cancer Support Group  2nd Tuesday of the month 1pm-2pm, Journey Room  > Creative Journey  3rd Tuesday of the month 1130am-1pm, Journey Room  > Look Good Feel Better  1st Wednesday of the month 10am-12 noon, Journey Room (Call [place] to register [phone])

## 2017-04-12 NOTE — Progress Notes (Signed)
This patient is already set up to get IV iron 2 doses. I notice her vitamin B12 level is low. Can we verify that she is giving herself IM injections every other week? Thanks Sonia Baller

## 2017-04-14 ENCOUNTER — Other Ambulatory Visit: Payer: Self-pay | Admitting: Oncology

## 2017-04-14 ENCOUNTER — Inpatient Hospital Stay (HOSPITAL_COMMUNITY): Payer: BLUE CROSS/BLUE SHIELD

## 2017-04-14 ENCOUNTER — Encounter (HOSPITAL_COMMUNITY): Payer: Self-pay

## 2017-04-14 DIAGNOSIS — D508 Other iron deficiency anemias: Secondary | ICD-10-CM

## 2017-04-14 DIAGNOSIS — E538 Deficiency of other specified B group vitamins: Secondary | ICD-10-CM | POA: Diagnosis not present

## 2017-04-14 DIAGNOSIS — D509 Iron deficiency anemia, unspecified: Secondary | ICD-10-CM | POA: Diagnosis not present

## 2017-04-14 MED ORDER — SODIUM CHLORIDE 0.9 % IV SOLN
510.0000 mg | Freq: Once | INTRAVENOUS | Status: AC
  Administered 2017-04-14: 510 mg via INTRAVENOUS
  Filled 2017-04-14: qty 17

## 2017-04-14 MED ORDER — SODIUM CHLORIDE 0.9 % IV SOLN
INTRAVENOUS | Status: DC
  Administered 2017-04-14: 15:00:00 via INTRAVENOUS

## 2017-04-14 NOTE — Patient Instructions (Signed)
[facility] at [facility]  Discharge Instructions:  You received an iron infusion today.  _______________________________________________________________  Thank you for choosing [facility] at [facility] to provide your oncology and hematology care.  To afford each patient quality time with our providers, please arrive at least 15 minutes before your scheduled appointment.  You need to re-schedule your appointment if you arrive 10 or more minutes late.  We strive to give you quality time with our providers, and arriving late affects you and other patients whose appointments are after yours.  Also, if you no show three or more times for appointments you may be dismissed from the clinic.  Again, thank you for choosing [facility] at [facility]. Our hope is that these requests will allow you access to exceptional care and in a timely manner. _______________________________________________________________  If you have questions after your visit, please contact our office at [phone] between the hours of 8:30 a.m. and 5:00 p.m. Voicemails left after 4:30 p.m. will not be returned until the following business day. _______________________________________________________________  For prescription refill requests, have your pharmacy contact our office. _______________________________________________________________  Recommendations made by the consultant and any test results will be sent to your referring physician. _______________________________________________________________ 

## 2017-04-14 NOTE — Progress Notes (Signed)
Patient tolerated iron infusion with no complaints voiced.  Good blood return noted before and after administration of iron.  No bruising or swelling noted at site.  No complaints of pain at site.  Band aid applied.  VSs with discharge and left ambulatory with no s/s of distress noted.

## 2017-04-21 ENCOUNTER — Encounter (HOSPITAL_COMMUNITY): Payer: Self-pay

## 2017-04-21 ENCOUNTER — Inpatient Hospital Stay (HOSPITAL_COMMUNITY): Payer: BLUE CROSS/BLUE SHIELD | Attending: Oncology

## 2017-04-21 ENCOUNTER — Other Ambulatory Visit: Payer: Self-pay

## 2017-04-21 VITALS — BP 110/63 | HR 62 | Temp 97.9°F | Resp 16

## 2017-04-21 DIAGNOSIS — E538 Deficiency of other specified B group vitamins: Secondary | ICD-10-CM | POA: Insufficient documentation

## 2017-04-21 DIAGNOSIS — D509 Iron deficiency anemia, unspecified: Secondary | ICD-10-CM | POA: Insufficient documentation

## 2017-04-21 DIAGNOSIS — D508 Other iron deficiency anemias: Secondary | ICD-10-CM

## 2017-04-21 MED ORDER — SODIUM CHLORIDE 0.9 % IV SOLN
Freq: Once | INTRAVENOUS | Status: AC
  Administered 2017-04-21: 14:00:00 via INTRAVENOUS

## 2017-04-21 MED ORDER — SODIUM CHLORIDE 0.9 % IV SOLN
510.0000 mg | Freq: Once | INTRAVENOUS | Status: AC
  Administered 2017-04-21: 510 mg via INTRAVENOUS
  Filled 2017-04-21: qty 17

## 2017-04-21 NOTE — Progress Notes (Signed)
Treatment given per orders. Patient tolerated it well without problems. Vitals stable and discharged home from clinic ambulatory. Follow up as scheduled.  

## 2017-04-21 NOTE — Patient Instructions (Signed)
[place] at [facility] Discharge Instructions  Feraheme given, follow up as scheduled.   Thank you for choosing [place] at [facility] to provide your oncology and hematology care.  To afford each patient quality time with our provider, please arrive at least 15 minutes before your scheduled appointment time.   If you have a lab appointment with the [facility] please come in thru the  Main Entrance and check in at the main information desk  You need to re-schedule your appointment should you arrive 10 or more minutes late.  We strive to give you quality time with our providers, and arriving late affects you and other patients whose appointments are after yours.  Also, if you no show three or more times for appointments you may be dismissed from the clinic at the providers discretion.     Again, thank you for choosing [facility].  Our hope is that these requests will decrease the amount of time that you wait before being seen by our physicians.       _____________________________________________________________  Should you have questions after your visit to [facility], please contact our office at [phone] between the hours of 8:30 a.m. and 4:30 p.m.  Voicemails left after 4:30 p.m. will not be returned until the following business day.  For prescription refill requests, have your pharmacy contact our office.       Resources For Cancer Patients and their Caregivers ? American Cancer Society: Can assist with transportation, wigs, general needs, runs Look Good Feel Better.        (636)704-7775 ? Cancer Care: Provides financial assistance, online support groups, medication/co-pay assistance.  1-800-813-HOPE 252-634-0561) ? [place] Assists [place] cancer patients and their families through emotional , educational and financial support.  7342166948 ? [place] DSS Where to  apply for food stamps, Medicaid and utility assistance. 931-887-1061 ? RCATS: Transportation to medical appointments. (636)166-0266 ? Social Security Administration: May apply for disability if have a Stage IV cancer. 938-577-5268 (706)602-6931 ? [employer], Disability and Transit Services: Assists with nutrition, care and transit needs. [place] Support Programs:   > Cancer Support Group  2nd Tuesday of the month 1pm-2pm, Journey Room   > Creative Journey  3rd Tuesday of the month 1130am-1pm, Journey Room

## 2017-04-24 MED ORDER — CYANOCOBALAMIN 1000 MCG/ML IJ SOLN: 1000.0000 ug | INTRAMUSCULAR | Status: AC

## 2017-04-24 NOTE — Progress Notes (Signed)
Hey. So I have refilled her B12 for her to take one injection every 14 days for 2 months. We will then bring her back for labs in approx 2 months.   Kristy Sharp

## 2017-04-25 ENCOUNTER — Other Ambulatory Visit (HOSPITAL_COMMUNITY): Payer: Self-pay

## 2017-04-25 DIAGNOSIS — E538 Deficiency of other specified B group vitamins: Secondary | ICD-10-CM

## 2017-05-01 ENCOUNTER — Encounter: Payer: Self-pay | Admitting: "Endocrinology

## 2017-05-01 LAB — TSH: TSH: 5.76 (ref <=5.90)

## 2017-05-11 ENCOUNTER — Encounter: Payer: Self-pay | Admitting: "Endocrinology

## 2017-05-11 DIAGNOSIS — E039 Hypothyroidism, unspecified: Secondary | ICD-10-CM | POA: Diagnosis not present

## 2017-07-04 ENCOUNTER — Encounter: Payer: Self-pay | Admitting: "Endocrinology

## 2017-07-04 ENCOUNTER — Ambulatory Visit: Payer: BLUE CROSS/BLUE SHIELD | Admitting: "Endocrinology

## 2017-07-04 VITALS — BP 121/78 | HR 74 | Ht 66.0 in | Wt 372.0 lb

## 2017-07-04 DIAGNOSIS — E038 Other specified hypothyroidism: Secondary | ICD-10-CM | POA: Diagnosis not present

## 2017-07-04 DIAGNOSIS — E063 Autoimmune thyroiditis: Secondary | ICD-10-CM | POA: Diagnosis not present

## 2017-07-04 HISTORY — DX: Morbid (severe) obesity due to excess calories: E66.01

## 2017-07-04 HISTORY — DX: Autoimmune thyroiditis: E06.3

## 2017-07-04 HISTORY — DX: Other specified hypothyroidism: E03.8

## 2017-07-04 MED ORDER — LEVOTHYROXINE SODIUM 50 MCG PO TABS: 50.0000 ug | ORAL_TABLET | Freq: Every day | ORAL | 3 refills | Status: DC

## 2017-07-04 NOTE — Progress Notes (Signed)
Endocrinology Consult Note                                            07/04/2017, 5:05 PM   Subjective:    Patient ID: Kristy Sharp, female    DOB: October 11, 1965, PCP Burdine, Virgina Evener, MD   Past Medical History:  Diagnosis Date  . Allergic rhinitis due to other allergen   . Anemia   . Anxiety   . Asthma   . Bursitis, trochanteric   . Cellulitis   . Depression   . Headache   . Hypercholesteremia   . Hyperglycemia   . Insomnia   . Obesity   . Sciatica   . Sinusitis   . Sleep apnea   . Vitamin B deficiency   . Vitamin D deficiency    Past Surgical History:  Procedure Laterality Date  . CHOLECYSTECTOMY N/A 04/27/2012   Procedure: LAPAROSCOPIC CHOLECYSTECTOMY;  Surgeon: Donato Heinz, MD;  Location: [facility];  Service: General;  Laterality: N/A;  . CHOLECYSTECTOMY    . EYE SURGERY    . GASTRIC BYPASS    . ORTHOPEDIC SURGERY    . WRIST SURGERY Left    Social History   Socioeconomic History  . Marital status: Single    Spouse name: Not on file  . Number of children: 0  . Years of education: college  . Highest education level: Not on file  Occupational History    Employer: [email or web address]    Comment: Call center  Social Needs  . Financial resource strain: Not on file  . Food insecurity:    Worry: Not on file    Inability: Not on file  . Transportation needs:    Medical: Not on file    Non-medical: Not on file  Tobacco Use  . Smoking status: Never Smoker  . Smokeless tobacco: Never Used  Substance and Sexual Activity  . Alcohol use: Yes    Alcohol/week: 0.6 oz    Types: 1 Cans of beer per week    Comment: occas.  . Drug use: No  . Sexual activity: Not on file  Lifestyle  . Physical activity:    Days per week: Not on file    Minutes per session: Not on file  . Stress: Not on file  Relationships  . Social connections:    Talks on phone: Not on file    Gets together: Not on file    Attends religious service: Not on file    Active member of club or  organization: Not on file    Attends meetings of clubs or organizations: Not on file    Relationship status: Not on file  Other Topics Concern  . Not on file  Social History Narrative   Patient is single   Patient is right-handed.   Patient works full time at a call center    Education some college   Caffeine coffee two cups daily and some times one soda            Outpatient Encounter Medications as of 07/04/2017  Medication Sig  . buPROPion (WELLBUTRIN) 100 MG tablet Take 100 mg by mouth daily.  . citalopram (CELEXA) 40 MG tablet Take 40 mg by mouth daily.  . cyanocobalamin (,VITAMIN B-12,) 1000 MCG/ML injection Inject 1 mL (1,000 mcg total) into the muscle every 14 (fourteen) days.  Marland Kitchen  LORazepam (ATIVAN) 1 MG tablet Take 1 mg by mouth 3 (three) times daily as needed.  . meloxicam (MOBIC) 15 MG tablet Take 15 mg by mouth at bedtime.  . Multiple Vitamin (MULTIVITAMIN) capsule Take 1 capsule by mouth daily.  Marland Kitchen OVER THE COUNTER MEDICATION Tumeric qd  . traZODone (DESYREL) 50 MG tablet Take 100 mg by mouth at bedtime as needed for sleep.  Marland Kitchen albuterol (PROVENTIL HFA;VENTOLIN HFA) 108 (90 BASE) MCG/ACT inhaler Inhale 2 puffs into the lungs every 6 (six) hours as needed for wheezing.  . Cholecalciferol (VITAMIN D PO) Take 1 capsule by mouth at bedtime.  . diphenhydrAMINE (BENADRYL) 25 mg capsule Take 25 mg by mouth every 6 (six) hours as needed for itching.  Marland Kitchen FOLIC ACID PO Take 1 tablet by mouth daily.  Marland Kitchen ibuprofen (ADVIL,MOTRIN) 200 MG tablet Take by mouth every 6 (six) hours as needed for pain.  Marland Kitchen levothyroxine (SYNTHROID, LEVOTHROID) 50 MCG tablet Take 1 tablet (50 mcg total) by mouth daily before breakfast.  . montelukast (SINGULAIR) 10 MG tablet Take 10 mg by mouth at bedtime.  . triamcinolone cream (KENALOG) 0.1 % Apply topically as needed.   . [DISCONTINUED] cyanocobalamin (,VITAMIN B-12,) 1000 MCG/ML injection Inject 1,000 mcg into the muscle every 30 (thirty) days.   No  facility-administered encounter medications on file as of 07/04/2017.    ALLERGIES: No Known Allergies  VACCINATION STATUS: Immunization History  Administered Date(s) Administered  . Influenza,inj,Quad PF,6+ Mos 11/17/2014  . Influenza-Unspecified 10/23/2015    HPI Kristy Sharp is 52 y.o. female who presents today with a medical history as above. she is being seen in consultation for abnormal thyroid function test requested by Curlene Labrum, MD. -She was diagnosed with Hashimoto's thyroiditis in 2016, however did not require thyroid hormone initiation. -Her most recent labs from May 01, 2017 showed TSH of 5.76, total T3 108, free T4 1.16 . -Patient has been dealing with symptoms of progressive weight gain (patient is status post gastric bypass in 2004 resulting in weight loss of 100 pounds, currently reversing 60 pounds of fat). -He complains of fatigue, and inability to lose weight. -She denies palpitations, heat intolerance, no tremors. -She has history of iron deficiency anemia on iron infusion on and off.   Review of Systems  Constitutional: +  weight gain, + fatigue, no subjective hyperthermia, no subjective hypothermia Eyes: no blurry vision, no xerophthalmia ENT: no sore throat, no nodules palpated in throat, no dysphagia/odynophagia, no hoarseness Cardiovascular: no Chest Pain, no Shortness of Breath, no palpitations, no leg swelling Respiratory: no cough, no SOB Gastrointestinal: no Nausea/Vomiting/Diarhhea Musculoskeletal: no muscle/joint aches Skin: no rashes Neurological: no tremors, no numbness, no tingling, no dizziness Psychiatric: no depression, no anxiety  Objective:    BP 121/78   Pulse 74   Ht 5\' 6"  (1.676 m)   Wt (!) 372 lb (168.7 kg)   BMI 60.04 kg/m   Wt Readings from Last 3 Encounters:  07/04/17 (!) 372 lb (168.7 kg)  04/11/17 (!) 371 lb (168.3 kg)  07/26/16 (!) 371 lb 4.8 oz (168.4 kg)    Physical Exam  Constitutional: + Obese for height  with BMI of 60, not in acute distress, normal state of mind Eyes: PERRLA, EOMI, no exophthalmos ENT: moist mucous membranes, no thyromegaly, no cervical lymphadenopathy Cardiovascular: normal precordial activity, Regular Rate and Rhythm, no Murmur/Rubs/Gallops Respiratory:  adequate breathing efforts, no gross chest deformity, Clear to auscultation bilaterally Gastrointestinal: abdomen soft, Non -tender, No distension, Bowel Sounds present  Musculoskeletal: Large bilateral lower extremities with no evidence of edema, no gross deformities, strength intact in all four extremities Skin: moist, warm, no rashes Neurological: no tremor with outstretched hands, Deep tendon reflexes normal in all four extremities.  CMP ( most recent) CMP     Component Value Date/Time   NA 138 04/11/2017 1445   K 3.9 04/11/2017 1445   CL 105 04/11/2017 1445   CO2 24 04/11/2017 1445   GLUCOSE 100 (H) 04/11/2017 1445   BUN 15 04/11/2017 1445   CREATININE 0.74 04/11/2017 1445   CALCIUM 8.7 (L) 04/11/2017 1445   PROT 6.6 04/11/2017 1445   ALBUMIN 3.5 04/11/2017 1445   AST 19 04/11/2017 1445   ALT 13 (L) 04/11/2017 1445   ALKPHOS 49 04/11/2017 1445   BILITOT 0.6 04/11/2017 1445   GFRNONAA >60 04/11/2017 1445   GFRAA >60 04/11/2017 1445     Lab Results  Component Value Date   TSH 5.76 05/01/2017   TSH 4.40 11/17/2014   TSH 3.31 05/15/2014   FREET4 0.97 11/17/2014   FREET4 0.72 05/15/2014      Assessment & Plan:   1. Hypothyroidism due to Hashimoto's thyroiditis  - Kristy Sharp  is being seen at a kind request of Burdine, Virgina Evener, MD. - I have reviewed her available thyroid records and clinically evaluated the patient. - Based on reviews, she has hypothyroidism secondary to Hashimoto's thyroiditis. -Even though her thyroid hormone levels are not in the range of hypothyroidism, she would benefit from early initiation of thyroid hormone replacement. -I discussed and initiated levothyroxine 50 mcg  p.o. every morning with plan to repeat labs and adjust her doses on subsequent visits. - - We discussed about correct intake of levothyroxine, at fasting, with water, separated by at least 30 minutes from breakfast, and separated by more than 4 hours from calcium, iron, multivitamins, acid reflux medications (PPIs). -Patient is made aware of the fact that thyroid hormone replacement is needed for life, dose to be adjusted by periodic monitoring of thyroid function tests.  Regarding her heavy weight: She is status post gastric bypass in 2004 which resulted in loss of 100 pounds, reversed 60% of that. -She will benefit from caloric restrictions. --  Suggestion is made for her to avoid simple carbohydrates  from her diet including Cakes, Sweet Desserts / Pastries, Ice Cream, Soda (diet and regular), Sweet Tea, Candies, Chips, Cookies, Store Bought Juices, Alcohol in Excess of  1-2 drinks a day, Artificial Sweeteners, and "Sugar-free" Products. This will help patient to have stable blood glucose profile and potentially avoid unintended weight gain.  -I will include A1c to screen for diabetes along with her next thyroid function test in 3 months.  - I advised her  to maintain close follow up with Burdine, Virgina Evener, MD for primary care needs.   - Time spent with the patient: 45 minutes, of which >50% was spent in obtaining information about her symptoms, reviewing her previous labs, evaluations, and treatments, counseling her about her hypothyroidism due to Hashimoto's thyroiditis, obesity, and developing a plan to confirm the diagnosis and long term treatment as necessary.  Kristy Sharp participated in the discussions, expressed understanding, and voiced agreement with the above plans.  All questions were answered to her satisfaction. she is encouraged to contact clinic should she have any questions or concerns prior to her return visit.  Follow up plan: Return in about 3 months (around 10/04/2017) for  follow up with pre-visit labs.   Kristy Lloyd,  MD [facility] [address] [place],  [address] Phone: [phone]  Fax: [phone]     07/04/2017, 5:05 PM  This note was partially dictated with voice recognition software. Similar sounding words can be transcribed inadequately or may not  be corrected upon review.

## 2017-07-04 NOTE — Patient Instructions (Signed)

## 2017-07-06 ENCOUNTER — Other Ambulatory Visit (HOSPITAL_COMMUNITY): Payer: Self-pay

## 2017-07-06 DIAGNOSIS — D508 Other iron deficiency anemias: Secondary | ICD-10-CM

## 2017-07-07 ENCOUNTER — Inpatient Hospital Stay (HOSPITAL_COMMUNITY): Payer: BLUE CROSS/BLUE SHIELD | Attending: Oncology

## 2017-07-07 DIAGNOSIS — E039 Hypothyroidism, unspecified: Secondary | ICD-10-CM | POA: Insufficient documentation

## 2017-07-07 DIAGNOSIS — D509 Iron deficiency anemia, unspecified: Secondary | ICD-10-CM | POA: Diagnosis not present

## 2017-07-07 DIAGNOSIS — E538 Deficiency of other specified B group vitamins: Secondary | ICD-10-CM | POA: Insufficient documentation

## 2017-07-07 DIAGNOSIS — D508 Other iron deficiency anemias: Secondary | ICD-10-CM

## 2017-07-07 LAB — CBC WITH DIFFERENTIAL/PLATELET
BASOS ABS: 0 10*3/uL (ref 0.0–0.1)
BASOS PCT: 0 %
EOS ABS: 0.1 10*3/uL (ref 0.0–0.7)
Eosinophils Relative: 2 %
HCT: 38.6 % (ref 36.0–46.0)
Hemoglobin: 12.5 g/dL (ref 12.0–15.0)
LYMPHS ABS: 1.5 10*3/uL (ref 0.7–4.0)
Lymphocytes Relative: 24 %
MCH: 32.8 pg (ref 26.0–34.0)
MCHC: 32.4 g/dL (ref 30.0–36.0)
MCV: 101.3 fL — ABNORMAL HIGH (ref 78.0–100.0)
Monocytes Absolute: 0.5 10*3/uL (ref 0.1–1.0)
Monocytes Relative: 9 %
NEUTROS PCT: 65 %
Neutro Abs: 3.9 10*3/uL (ref 1.7–7.7)
PLATELETS: 216 10*3/uL (ref 150–400)
RBC: 3.81 MIL/uL — AB (ref 3.87–5.11)
RDW: 14 % (ref 11.5–15.5)
WBC: 6 10*3/uL (ref 4.0–10.5)

## 2017-07-07 LAB — IRON AND TIBC
Iron: 61 ug/dL (ref 28–170)
Saturation Ratios: 26 % (ref 10.4–31.8)
TIBC: 232 ug/dL — ABNORMAL LOW (ref 250–450)
UIBC: 171 ug/dL

## 2017-07-07 LAB — VITAMIN B12: VITAMIN B 12: 323 pg/mL (ref 180–914)

## 2017-07-07 LAB — FERRITIN: FERRITIN: 138 ng/mL (ref 11–307)

## 2017-07-14 ENCOUNTER — Encounter (HOSPITAL_COMMUNITY): Payer: Self-pay | Admitting: Hematology

## 2017-07-14 ENCOUNTER — Inpatient Hospital Stay (HOSPITAL_BASED_OUTPATIENT_CLINIC_OR_DEPARTMENT_OTHER): Payer: BLUE CROSS/BLUE SHIELD | Admitting: Hematology

## 2017-07-14 VITALS — BP 96/50 | HR 82 | Temp 98.6°F | Resp 18 | Wt 379.0 lb

## 2017-07-14 DIAGNOSIS — D508 Other iron deficiency anemias: Secondary | ICD-10-CM

## 2017-07-14 DIAGNOSIS — D509 Iron deficiency anemia, unspecified: Secondary | ICD-10-CM | POA: Diagnosis not present

## 2017-07-14 DIAGNOSIS — E538 Deficiency of other specified B group vitamins: Secondary | ICD-10-CM | POA: Diagnosis not present

## 2017-07-14 DIAGNOSIS — E039 Hypothyroidism, unspecified: Secondary | ICD-10-CM | POA: Diagnosis not present

## 2017-07-14 MED ORDER — SODIUM CHLORIDE 0.9 % IV SOLN: 510.0000 mg | Freq: Once | INTRAVENOUS | Status: DC

## 2017-07-14 NOTE — Progress Notes (Signed)
Kristy Sharp, [place] [id number]   CLINIC:  Medical Oncology/Hematology  PCP:  Curlene Labrum, MD [place] [id number] [phone]   REASON FOR VISIT:  Follow-up for iron deficiency and B12 deficiency.  CURRENT THERAPY: Intermittent Feraheme infusions.   INTERVAL HISTORY:  Kristy Sharp 52 y.o. female returns for routine follow-up of iron deficiency and B12 deficiency.  She reports that she was started on Synthroid 50 mcg about 1 week ago by Dr. Dorris Fetch.  She continues to have some fatigue.  She is concerned about her weight gain.  She denies any bleeding per rectum or melena.  She is taking B12 shots q. 14 days.  This was changed from once a month at last visit.  She felt slight improvement in her energy levels after her last iron infusions.  REVIEW OF SYSTEMS:  Review of Systems  Constitutional: Positive for fatigue.  Cardiovascular: Positive for leg swelling.  Gastrointestinal: Positive for constipation.  Neurological: Positive for dizziness.  All other systems reviewed and are negative.    PAST MEDICAL/SURGICAL HISTORY:  Past Medical History:  Diagnosis Date  . Allergic rhinitis due to other allergen   . Anemia   . Anxiety   . Asthma   . Bursitis, trochanteric   . Cellulitis   . Depression   . Headache   . Hypercholesteremia   . Hyperglycemia   . Insomnia   . Obesity   . Sciatica   . Sinusitis   . Sleep apnea   . Vitamin B deficiency   . Vitamin D deficiency    Past Surgical History:  Procedure Laterality Date  . CHOLECYSTECTOMY N/A 04/27/2012   Procedure: LAPAROSCOPIC CHOLECYSTECTOMY;  Surgeon: Donato Heinz, MD;  Location: [facility];  Service: General;  Laterality: N/A;  . CHOLECYSTECTOMY    . EYE SURGERY    . GASTRIC BYPASS    . ORTHOPEDIC SURGERY    . WRIST SURGERY Left      SOCIAL HISTORY:  Social History   Socioeconomic History  . Marital status: Single    Spouse name: Not on file  . Number of  children: 0  . Years of education: college  . Highest education level: Not on file  Occupational History    Employer: [email or web address]    Comment: Call center  Social Needs  . Financial resource strain: Not on file  . Food insecurity:    Worry: Not on file    Inability: Not on file  . Transportation needs:    Medical: Not on file    Non-medical: Not on file  Tobacco Use  . Smoking status: Never Smoker  . Smokeless tobacco: Never Used  Substance and Sexual Activity  . Alcohol use: Yes    Alcohol/week: 0.6 oz    Types: 1 Cans of beer per week    Comment: occas.  . Drug use: No  . Sexual activity: Not on file  Lifestyle  . Physical activity:    Days per week: Not on file    Minutes per session: Not on file  . Stress: Not on file  Relationships  . Social connections:    Talks on phone: Not on file    Gets together: Not on file    Attends religious service: Not on file    Active member of club or organization: Not on file    Attends meetings of clubs or organizations: Not on file    Relationship status: Not on  file  . Intimate partner violence:    Fear of current or ex partner: Not on file    Emotionally abused: Not on file    Physically abused: Not on file    Forced sexual activity: Not on file  Other Topics Concern  . Not on file  Social History Narrative   Patient is single   Patient is right-handed.   Patient works full time at a call center    Education some college   Caffeine coffee two cups daily and some times one soda             FAMILY HISTORY:  Family History  Problem Relation Age of Onset  . Lupus Mother   . Heart Problems Mother   . Gout Mother   . Lung cancer Father   . Colon cancer Maternal Grandmother     CURRENT MEDICATIONS:  Outpatient Encounter Medications as of 07/14/2017  Medication Sig  . albuterol (PROVENTIL HFA;VENTOLIN HFA) 108 (90 BASE) MCG/ACT inhaler Inhale 2 puffs into the lungs every 6 (six) hours as needed for wheezing.  Marland Kitchen  buPROPion (WELLBUTRIN) 100 MG tablet Take 100 mg by mouth daily.  . Cholecalciferol (VITAMIN D PO) Take 1 capsule by mouth at bedtime.  . citalopram (CELEXA) 40 MG tablet Take 40 mg by mouth daily.  . cyanocobalamin (,VITAMIN B-12,) 1000 MCG/ML injection Inject 1 mL (1,000 mcg total) into the muscle every 14 (fourteen) days.  . diphenhydrAMINE (BENADRYL) 25 mg capsule Take 25 mg by mouth every 6 (six) hours as needed for itching.  Marland Kitchen FOLIC ACID PO Take 1 tablet by mouth daily.  Marland Kitchen ibuprofen (ADVIL,MOTRIN) 200 MG tablet Take by mouth every 6 (six) hours as needed for pain.  Marland Kitchen levothyroxine (SYNTHROID, LEVOTHROID) 50 MCG tablet Take 1 tablet (50 mcg total) by mouth daily before breakfast.  . Lifitegrast (XIIDRA) 5 % SOLN Apply to eye.  Marland Kitchen LORazepam (ATIVAN) 1 MG tablet Take 1 mg by mouth 3 (three) times daily as needed.  . meloxicam (MOBIC) 15 MG tablet Take 15 mg by mouth at bedtime.  . montelukast (SINGULAIR) 10 MG tablet Take 10 mg by mouth at bedtime.  . Multiple Vitamin (MULTIVITAMIN) capsule Take 1 capsule by mouth daily.  Marland Kitchen OVER THE COUNTER MEDICATION Tumeric qd  . RESTASIS 0.05 % ophthalmic emulsion   . traZODone (DESYREL) 50 MG tablet Take 100 mg by mouth at bedtime as needed for sleep.  Marland Kitchen triamcinolone cream (KENALOG) 0.1 % Apply topically as needed.    Facility-Administered Encounter Medications as of 07/14/2017  Medication  . [START ON 09/08/2017] ferumoxytol (FERAHEME) 510 mg in sodium chloride 0.9 % 100 mL IVPB    ALLERGIES:  No Known Allergies   PHYSICAL EXAM:  ECOG Performance status: 1  Vitals:   07/14/17 1534  BP: (!) 96/50  Pulse: 82  Resp: 18  Temp: 98.6 F (37 C)  SpO2: 98%   Filed Weights   07/14/17 1534  Weight: (!) 379 lb (171.9 kg)    Physical Exam   LABORATORY DATA:  I have reviewed the labs as listed.  CBC    Component Value Date/Time   WBC 6.0 07/07/2017 1550   RBC 3.81 (L) 07/07/2017 1550   HGB 12.5 07/07/2017 1550   HCT 38.6 07/07/2017  1550   PLT 216 07/07/2017 1550   MCV 101.3 (H) 07/07/2017 1550   MCH 32.8 07/07/2017 1550   MCHC 32.4 07/07/2017 1550   RDW 14.0 07/07/2017 1550   LYMPHSABS 1.5 07/07/2017  1550   MONOABS 0.5 07/07/2017 1550   EOSABS 0.1 07/07/2017 1550   BASOSABS 0.0 07/07/2017 1550   CMP Latest Ref Rng & Units 04/11/2017 09/06/2016 07/26/2016  Glucose 65 - 99 mg/dL 100(H) 101(H) 102(H)  BUN 6 - 20 mg/dL 15 16 14   Creatinine 0.44 - 1.00 mg/dL 0.74 0.77 0.74  Sodium 135 - 145 mmol/L 138 136 139  Potassium 3.5 - 5.1 mmol/L 3.9 4.0 4.0  Chloride 101 - 111 mmol/L 105 104 106  CO2 22 - 32 mmol/L 24 25 26   Calcium 8.9 - 10.3 mg/dL 8.7(L) 8.8(L) 8.9  Total Protein 6.5 - 8.1 g/dL 6.6 6.6 7.0  Total Bilirubin 0.3 - 1.2 mg/dL 0.6 0.4 0.5  Alkaline Phos 38 - 126 U/L 49 50 47  AST 15 - 41 U/L 19 19 19   ALT 14 - 54 U/L 13(L) 14 13(L)         ASSESSMENT & PLAN:   Iron deficiency anemia 1.  Iron deficiency anemia: -History of gastric bypass in 2004, last 100 pounds, gained back off of it - Has been on parenteral iron since 2008, last Feraheme on 04/14/2017 and 04/21/2017 -Ferritin improved from 23 to 138 after last set of Feraheme infusions.  Hemoglobin is stable at 12.5.  I have recommended maintenance Feraheme infusion in 8 weeks.  She will be seen back in 4 months with repeat blood work.  2.  B12 deficiency: - Patient currently taking B12 injections every 14 days.  Her B12 has improved to 323.  3.  Hypothyroidism: -Patient reports that she was started on Synthroid 50 mcg 1 week ago.  This is managed by Dr. Dorris Fetch.      Orders placed this encounter:  Orders Placed This Encounter  Procedures  . CBC with Differential  . Basic metabolic panel  . Iron and TIBC  . Ferritin  . Vitamin B12  . Folate      Derek Jack, MD [place] [phone]

## 2017-07-14 NOTE — Assessment & Plan Note (Signed)
1.  Iron deficiency anemia: -History of gastric bypass in 2004, last 100 pounds, gained back off of it - Has been on parenteral iron since 2008, last Feraheme on 04/14/2017 and 04/21/2017 -Ferritin improved from 23 to 138 after last set of Feraheme infusions.  Hemoglobin is stable at 12.5.  I have recommended maintenance Feraheme infusion in 8 weeks.  She will be seen back in 4 months with repeat blood work.  2.  B12 deficiency: - Patient currently taking B12 injections every 14 days.  Her B12 has improved to 323.  3.  Hypothyroidism: -Patient reports that she was started on Synthroid 50 mcg 1 week ago.  This is managed by Dr. Dorris Fetch.

## 2017-09-06 ENCOUNTER — Encounter (HOSPITAL_COMMUNITY): Payer: Self-pay

## 2017-09-06 ENCOUNTER — Inpatient Hospital Stay (HOSPITAL_COMMUNITY): Payer: BLUE CROSS/BLUE SHIELD | Attending: Oncology

## 2017-09-06 VITALS — BP 108/51 | HR 64 | Temp 97.2°F | Resp 18 | Wt 379.0 lb

## 2017-09-06 DIAGNOSIS — D509 Iron deficiency anemia, unspecified: Secondary | ICD-10-CM | POA: Diagnosis not present

## 2017-09-06 DIAGNOSIS — D508 Other iron deficiency anemias: Secondary | ICD-10-CM

## 2017-09-06 DIAGNOSIS — K909 Intestinal malabsorption, unspecified: Secondary | ICD-10-CM | POA: Insufficient documentation

## 2017-09-06 DIAGNOSIS — Z9884 Bariatric surgery status: Secondary | ICD-10-CM | POA: Insufficient documentation

## 2017-09-06 MED ORDER — SODIUM CHLORIDE 0.9 % IV SOLN
INTRAVENOUS | Status: DC
  Administered 2017-09-06: 14:00:00 via INTRAVENOUS

## 2017-09-06 MED ORDER — SODIUM CHLORIDE 0.9 % IV SOLN
510.0000 mg | Freq: Once | INTRAVENOUS | Status: AC
  Administered 2017-09-06: 510 mg via INTRAVENOUS
  Filled 2017-09-06: qty 17

## 2017-09-06 NOTE — Progress Notes (Signed)
Kristy Sharp tolerated Feraheme infusion well without complaints or incident. Peripheral IV site with positive blood return prior to and after infusion. VSS upon dsicharge. Pt discharged self ambulatory in satisfactory condition

## 2017-09-06 NOTE — Patient Instructions (Signed)
[facility] Discharge Instructions  Received Feraheme infusion today. Follow-up as scheduled. Call clinic for any questions or concerns   Thank you for choosing [facility] at [facility] to provide your oncology and hematology care.  To afford each patient quality time with our provider, please arrive at least 15 minutes before your scheduled appointment time.   If you have a lab appointment with the Cancer Center please come in thru the  Main Entrance and check in at the main information desk  You need to re-schedule your appointment should you arrive 10 or more minutes late.  We strive to give you quality time with our providers, and arriving late affects you and other patients whose appointments are after yours.  Also, if you no show three or more times for appointments you may be dismissed from the clinic at the providers discretion.     Again, thank you for choosing [facility].  Our hope is that these requests will decrease the amount of time that you wait before being seen by our physicians.       _____________________________________________________________  Should you have questions after your visit to [facility], please contact our office at [phone] between the hours of 8:30 a.m. and 4:30 p.m.  Voicemails left after 4:30 p.m. will not be returned until the following business day.  For prescription refill requests, have your pharmacy contact our office.       Resources For Cancer Patients and their Caregivers ? American Cancer Society: Can assist with transportation, wigs, general needs, runs Look Good Feel Better.        1-888-227-6333 ? Cancer Care: Provides financial assistance, online support groups, medication/co-pay assistance.  1-800-813-HOPE (4673) ? [facility] Assists [place] cancer patients and their families through emotional , educational and  financial support.  336-427-4357 ? [place] DSS Where to apply for food stamps, Medicaid and utility assistance. 336-342-1394 ? [facility]: Transportation to medical appointments. 336-347-2287 ? Social Security Administration: May apply for disability if have a Stage IV cancer. 336-342-7796 1-800-772-1213 ? [place] Aging, Disability and Transit Services: Assists with nutrition, care and transit needs. 336-349-2343  Cancer Center Support Programs:   > Cancer Support Group  2nd Tuesday of the month 1pm-2pm, Journey Room   > Creative Journey  3rd Tuesday of the month 1130am-1pm, Journey Room    

## 2017-10-05 ENCOUNTER — Telehealth: Payer: Self-pay | Admitting: "Endocrinology

## 2017-10-05 ENCOUNTER — Ambulatory Visit: Payer: BLUE CROSS/BLUE SHIELD | Admitting: "Endocrinology

## 2017-10-05 NOTE — Telephone Encounter (Signed)
Patient stated she did not like Dr. Dorris Fetch and she will find another Endo for Thyroid Care. She dismissed her self from the office on 10/05/17

## 2017-10-30 DIAGNOSIS — E039 Hypothyroidism, unspecified: Secondary | ICD-10-CM | POA: Diagnosis not present

## 2017-10-30 DIAGNOSIS — Z9884 Bariatric surgery status: Secondary | ICD-10-CM | POA: Diagnosis not present

## 2017-10-30 DIAGNOSIS — E538 Deficiency of other specified B group vitamins: Secondary | ICD-10-CM | POA: Diagnosis not present

## 2017-10-30 DIAGNOSIS — D509 Iron deficiency anemia, unspecified: Secondary | ICD-10-CM | POA: Diagnosis not present

## 2017-10-30 DIAGNOSIS — D7589 Other specified diseases of blood and blood-forming organs: Secondary | ICD-10-CM | POA: Diagnosis not present

## 2017-10-30 DIAGNOSIS — Z86010 Personal history of colonic polyps: Secondary | ICD-10-CM | POA: Diagnosis not present

## 2017-11-07 ENCOUNTER — Other Ambulatory Visit (HOSPITAL_COMMUNITY): Payer: BLUE CROSS/BLUE SHIELD

## 2017-11-14 ENCOUNTER — Ambulatory Visit (HOSPITAL_COMMUNITY): Payer: BLUE CROSS/BLUE SHIELD | Admitting: Hematology

## 2017-11-17 ENCOUNTER — Ambulatory Visit (HOSPITAL_COMMUNITY): Payer: BLUE CROSS/BLUE SHIELD | Admitting: Hematology

## 2018-02-01 ENCOUNTER — Other Ambulatory Visit: Payer: Self-pay

## 2018-02-01 ENCOUNTER — Emergency Department
Admission: EM | Admit: 2018-02-01 | Discharge: 2018-02-01 | Disposition: A | Discharge status: Home / Self Care | Payer: BLUE CROSS/BLUE SHIELD | Source: Home / Self Care

## 2018-02-01 DIAGNOSIS — R69 Illness, unspecified: Secondary | ICD-10-CM

## 2018-02-01 DIAGNOSIS — J111 Influenza due to unidentified influenza virus with other respiratory manifestations: Secondary | ICD-10-CM | POA: Diagnosis not present

## 2018-02-01 LAB — POCT INFLUENZA A/B
Influenza A, POC: NEGATIVE
Influenza B, POC: NEGATIVE

## 2018-02-01 LAB — POCT RAPID STREP A (OFFICE): RAPID STREP A SCREEN: NEGATIVE

## 2018-02-01 MED ORDER — OSELTAMIVIR PHOSPHATE 75 MG PO CAPS: ORAL_CAPSULE | ORAL | 0 refills | Status: DC

## 2018-02-01 NOTE — ED Triage Notes (Signed)
Kristy Sharp presents with a sore throat, post nasal drainage, coughing, wheezing, body aches and head congestion. She states that in her workplace several people have been diagnosed for both strep and flu. She denies taking any otc medications. VSS and afebrile.

## 2018-02-01 NOTE — ED Provider Notes (Signed)
Kristy Sharp CARE    CSN: [id number] Arrival date & time: 02/01/18  1040     History   Chief Complaint Chief Complaint  Patient presents with  . Sore Throat  . Nasal Congestion  . Cough    HPI Kristy Sharp is a 52 y.o. female.   HPI   HPI : Flu symptoms for about 1 day. Fever to 102 with chills, sweats, myalgias, fatigue, headache. Symptoms are progressively worsening, despite trying OTC fever reducing medicine and rest and fluids. Has decreased appetite, but tolerating some liquids by mouth. No history of recent tick bite.  Review of Systems: Positive for fatigue, mild nasal congestion, mild sore throat, mild swollen anterior neck glands, mild cough, mild flareup of wheezing.  She has very mild asthma, uses albuterol HFA as needed about once every 3 to 4 months.  The albuterol helps the wheezing and she denies shortness of breath or wheezing right now. Negative for acute vision changes, stiff neck, focal weakness, syncope, seizures, respiratory distress, vomiting, diarrhea, GU symptoms, new rash.  Past Medical History:  Diagnosis Date  . Allergic rhinitis due to other allergen   . Anemia   . Anxiety   . Asthma   . Bursitis, trochanteric   . Cellulitis   . Depression   . Headache   . Hypercholesteremia   . Hyperglycemia   . Insomnia   . Obesity   . Sciatica   . Sinusitis   . Sleep apnea   . Vitamin B deficiency   . Vitamin D deficiency     Patient Active Problem List   Diagnosis Date Noted  . Hypothyroidism due to Hashimoto's thyroiditis 07/04/2017  . Morbid obesity ([place]) 07/04/2017  . H/O gastric bypass 04/27/2016  . B12 deficiency 04/27/2016  . Iron deficiency anemia 04/27/2016  . Hashimoto's thyroiditis 11/17/2014  . Right lumbar radiculopathy 05/02/2013    Past Surgical History:  Procedure Laterality Date  . CHOLECYSTECTOMY N/A 04/27/2012   Procedure: LAPAROSCOPIC CHOLECYSTECTOMY;  Surgeon: Donato Heinz, MD;  Location: [facility];  Service:  General;  Laterality: N/A;  . CHOLECYSTECTOMY    . EYE SURGERY    . GASTRIC BYPASS    . ORTHOPEDIC SURGERY    . WRIST SURGERY Left     OB History   No obstetric history on file.      Home Medications    Prior to Admission medications   Medication Sig Start Date End Date Taking? Authorizing Provider  albuterol (PROVENTIL HFA;VENTOLIN HFA) 108 (90 BASE) MCG/ACT inhaler Inhale 2 puffs into the lungs every 6 (six) hours as needed for wheezing.    [provider]  buPROPion (WELLBUTRIN) 100 MG tablet Take 100 mg by mouth daily.    [provider]  Cholecalciferol (VITAMIN D PO) Take 1 capsule by mouth at bedtime.    [provider]  citalopram (CELEXA) 40 MG tablet Take 40 mg by mouth daily.    [provider]  cyanocobalamin (,VITAMIN B-12,) 1000 MCG/ML injection Inject 1 mL (1,000 mcg total) into the muscle every 14 (fourteen) days. 06/09/16   Twana First, MD  diphenhydrAMINE (BENADRYL) 25 mg capsule Take 25 mg by mouth every 6 (six) hours as needed for itching.    [provider]  FOLIC ACID PO Take 1 tablet by mouth daily.    [provider]  ibuprofen (ADVIL,MOTRIN) 200 MG tablet Take by mouth every 6 (six) hours as needed for pain.    [provider]  levothyroxine (SYNTHROID,  LEVOTHROID) 50 MCG tablet Take 1 tablet (50 mcg total) by mouth daily before breakfast. 07/04/17   Nida, Marella Chimes, MD  Lifitegrast Shirley Friar) 5 % SOLN Apply to eye. 06/23/17 06/23/18  [provider]  LORazepam (ATIVAN) 1 MG tablet Take 1 mg by mouth 3 (three) times daily as needed.    [provider]  meloxicam (MOBIC) 15 MG tablet Take 15 mg by mouth at bedtime.    [provider]  Multiple Vitamin (MULTIVITAMIN) capsule Take 1 capsule by mouth daily.    [provider]  oseltamivir (TAMIFLU) 75 MG capsule Starting today, take 1 capsule by mouth twice a day for 5 days. 02/01/18   Jacqulyn Cane, MD  OVER THE  COUNTER MEDICATION Tumeric qd    [provider]  RESTASIS 0.05 % ophthalmic emulsion  06/29/17   [provider]  traZODone (DESYREL) 50 MG tablet Take 100 mg by mouth at bedtime as needed for sleep.    [provider]  triamcinolone cream (KENALOG) 0.1 % Apply topically as needed.     [provider]    Family History Family History  Problem Relation Age of Onset  . Lupus Mother   . Heart Problems Mother   . Gout Mother   . Lung cancer Father   . Colon cancer Maternal Grandmother     Social History Social History   Tobacco Use  . Smoking status: Never Smoker  . Smokeless tobacco: Never Used  Substance Use Topics  . Alcohol use: Yes    Alcohol/week: 1.0 standard drinks    Types: 1 Cans of beer per week    Comment: occas.  . Drug use: No     Allergies   Patient has no known allergies.   Review of Systems Review of Systems  All other systems reviewed and are negative. Pertinent items noted in HPI and remainder of comprehensive ROS otherwise negative.   Physical Exam Triage Vital Signs ED Triage Vitals  Enc Vitals Group     BP 02/01/18 1214 (!) 108/57     Pulse Rate 02/01/18 1214 67     Resp 02/01/18 1214 18     Temp 02/01/18 1214 97.8 F (36.6 C)     Temp Source 02/01/18 1214 Oral     SpO2 02/01/18 1214 97 %     Weight 02/01/18 1215 (!) 373 lb 12.8 oz (169.6 kg)     Height 02/01/18 1215 5\' 6"  (1.676 m)     Head Circumference --      Peak Flow --      Pain Score 02/01/18 1215 0     Pain Loc --      Pain Edu? --      Excl. in [place]? --    No data found.  Updated Vital Signs BP (!) 108/57 (BP Location: Right Arm)   Pulse 67   Temp 97.8 F (36.6 C) (Oral)   Resp 18   Ht 5\' 6"  (1.676 m)   Wt (!) 169.6 kg   LMP 02/01/2018 (Exact Date)   SpO2 97%   BMI 60.33 kg/m   Visual Acuity Right Eye Distance:   Left Eye Distance:   Bilateral Distance:    Right Eye Near:   Left Eye Near:    Bilateral Near:     Physical  Exam Vitals signs and nursing note reviewed.  Constitutional:      General: She is not in acute distress.    Appearance: She is well-developed. She  is ill-appearing (very fatigued, but no cardiorespiratory distress) and diaphoretic. She is not toxic-appearing.  HENT:     Head: Normocephalic and atraumatic.     Right Ear: Tympanic membrane and external ear normal.     Left Ear: Tympanic membrane and external ear normal.     Nose: Rhinorrhea present.     Mouth/Throat:     Pharynx: Posterior oropharyngeal erythema (mild redness ) present. No oropharyngeal exudate.  Eyes:     General: No scleral icterus.       Right eye: No discharge.        Left eye: No discharge.     Conjunctiva/sclera: Conjunctivae normal.  Neck:     Musculoskeletal: Neck supple.  Cardiovascular:     Rate and Rhythm: Normal rate and regular rhythm.     Heart sounds: Normal heart sounds.  Pulmonary:     Effort: No respiratory distress.     Breath sounds: Normal breath sounds. No stridor. No wheezing or rales.     Comments: Breath sounds equal.  No wheezes heard Abdominal:     Palpations: Abdomen is soft.     Tenderness: There is no abdominal tenderness.  Lymphadenopathy:     Cervical: Cervical adenopathy (mild shoddy anterior cervical nodes) present.  Skin:    General: Skin is warm.     Findings: No rash.  Neurological:     Mental Status: She is alert.      UC Treatments / Results  Labs (all labs ordered are listed, but only abnormal results are displayed) Labs Reviewed  POCT INFLUENZA A/B - Normal  POCT RAPID STREP A (OFFICE) - Normal  STREP A DNA PROBE    EKG None  Radiology No results found.  Procedures Procedures (including critical care time)  Medications Ordered in UC Medications - No data to display  Initial Impression / Assessment and Plan / UC Course  I have reviewed the triage vital signs and the nursing notes.  Pertinent labs & imaging results that were available during my care  of the patient were reviewed by me and considered in my medical decision making (see chart for details).    Rapid strep test and rapid flu tests negative.  However clinically she has all signs and symptoms and findings of influenza and will treat with Tamiflu after risk benefits alternatives discussed. Patient declined AVS, but detailed verbal instructions given. Tamiflu prescribed.  Other symptomatic care discussed at length. Also advised to use albuterol HFA as needed rescue inhaler and seek medical care if any worsening of her asthma or any other worsening of respiratory symptoms. Precautions and red flags discussed.  She voiced understanding. Final Clinical Impressions(s) / UC Diagnoses   Final diagnoses:  Influenza-like illness   ED Prescriptions    Medication Sig Dispense Auth. Provider   oseltamivir (TAMIFLU) 75 MG capsule Starting today, take 1 capsule by mouth twice a day for 5 days. 10 capsule Jacqulyn Cane, MD     Controlled Substance Prescriptions [place] Controlled Substance Registry consulted? Not Applicable   Jacqulyn Cane, MD 02/01/18 1714

## 2018-02-02 ENCOUNTER — Telehealth: Payer: Self-pay | Admitting: *Deleted

## 2018-02-02 LAB — STREP A DNA PROBE: Group A Strep Probe: NOT DETECTED

## 2018-02-02 NOTE — Telephone Encounter (Signed)
LM with Tcx results and to call back if she has any questions or concerns.

## 2018-02-05 DIAGNOSIS — Z6841 Body Mass Index (BMI) 40.0 and over, adult: Secondary | ICD-10-CM | POA: Diagnosis not present

## 2018-02-05 DIAGNOSIS — J019 Acute sinusitis, unspecified: Secondary | ICD-10-CM | POA: Diagnosis not present

## 2018-02-05 DIAGNOSIS — J209 Acute bronchitis, unspecified: Secondary | ICD-10-CM | POA: Diagnosis not present

## 2018-02-23 ENCOUNTER — Ambulatory Visit: Payer: BLUE CROSS/BLUE SHIELD | Admitting: Osteopathic Medicine

## 2018-02-28 ENCOUNTER — Ambulatory Visit: Payer: BLUE CROSS/BLUE SHIELD | Admitting: Physician Assistant

## 2018-02-28 ENCOUNTER — Encounter: Payer: Self-pay | Admitting: Physician Assistant

## 2018-02-28 ENCOUNTER — Ambulatory Visit (INDEPENDENT_AMBULATORY_CARE_PROVIDER_SITE_OTHER): Payer: BLUE CROSS/BLUE SHIELD

## 2018-02-28 VITALS — BP 117/84 | HR 68 | Ht 66.0 in | Wt 369.0 lb

## 2018-02-28 DIAGNOSIS — Z860100 Personal history of colon polyps, unspecified: Secondary | ICD-10-CM | POA: Insufficient documentation

## 2018-02-28 DIAGNOSIS — M1711 Unilateral primary osteoarthritis, right knee: Secondary | ICD-10-CM

## 2018-02-28 DIAGNOSIS — Z9884 Bariatric surgery status: Secondary | ICD-10-CM

## 2018-02-28 DIAGNOSIS — M25511 Pain in right shoulder: Secondary | ICD-10-CM

## 2018-02-28 DIAGNOSIS — M25561 Pain in right knee: Secondary | ICD-10-CM

## 2018-02-28 DIAGNOSIS — E538 Deficiency of other specified B group vitamins: Secondary | ICD-10-CM | POA: Diagnosis not present

## 2018-02-28 DIAGNOSIS — Z79899 Other long term (current) drug therapy: Secondary | ICD-10-CM

## 2018-02-28 DIAGNOSIS — F419 Anxiety disorder, unspecified: Secondary | ICD-10-CM

## 2018-02-28 DIAGNOSIS — F5104 Psychophysiologic insomnia: Secondary | ICD-10-CM

## 2018-02-28 DIAGNOSIS — R29818 Other symptoms and signs involving the nervous system: Secondary | ICD-10-CM | POA: Insufficient documentation

## 2018-02-28 DIAGNOSIS — E038 Other specified hypothyroidism: Secondary | ICD-10-CM

## 2018-02-28 DIAGNOSIS — Z7689 Persons encountering health services in other specified circumstances: Secondary | ICD-10-CM

## 2018-02-28 DIAGNOSIS — E063 Autoimmune thyroiditis: Secondary | ICD-10-CM

## 2018-02-28 DIAGNOSIS — Z1211 Encounter for screening for malignant neoplasm of colon: Secondary | ICD-10-CM

## 2018-02-28 MED ORDER — CITALOPRAM HYDROBROMIDE 40 MG PO TABS: 40.0000 mg | ORAL_TABLET | Freq: Every day | ORAL | 1 refills | Status: DC

## 2018-02-28 MED ORDER — LEVOTHYROXINE SODIUM 75 MCG PO TABS: 75.0000 ug | ORAL_TABLET | Freq: Every day | ORAL | 0 refills | Status: DC

## 2018-02-28 MED ORDER — LORAZEPAM 1 MG PO TABS: 1.0000 mg | ORAL_TABLET | Freq: Every day | ORAL | 2 refills | Status: DC | PRN

## 2018-02-28 MED ORDER — TRAZODONE HCL 50 MG PO TABS: 50.0000 mg | ORAL_TABLET | Freq: Every evening | ORAL | 0 refills | Status: DC | PRN

## 2018-02-28 MED ORDER — OMEPRAZOLE 20 MG PO CPDR: 20.0000 mg | DELAYED_RELEASE_CAPSULE | Freq: Every day | ORAL | 1 refills | Status: DC | PRN

## 2018-02-28 MED ORDER — BARIATRIC FUSION PO CHEW: CHEWABLE_TABLET | ORAL | 3 refills | Status: DC

## 2018-02-28 NOTE — Progress Notes (Signed)
HPI:                                                                Kristy Sharp is a 53 y.o. female who presents to [facility] today to establish care  Current concerns: medication refills, vision change,   IDA: chronic anemia 2/2 gastric bypass surgery in 2004, followed by [facility], Dr. Maryjo Rochester, for iron infusions.   Vision changes: She had Lasik in August of 2018 with Dr. Lyndel Safe at [facility]. Reports she is having intermittent visual changes described as "a wave that comes across" lasting for about an hour. Vision is normal in between episodes. States she has a history of migraines, but there is no associated headache with these visual disturbances. States migraines are typically triggered by eating too much chocolate.   Depression/Anxiety: taking Celexa 40 mg and has been taking Lorazepam 1 mg up to three times daily as needed for anxiety for years, as far back as 2014. She was using this for sleep, but she was able to stop using it for sleep and now uses Trazodone 100 mg. She would like to stop taking sleep medication. Endorses depressed mood and anhedonia some days. Denies symptoms of mania/hypomania. Denies suicidal thinking. Denies auditory/visual hallucinations.  Hypothyroidism: diagnosed with Hashimotos in 2015. Takes synthroid 75 mcg daily without issues. Continues to endorse chronic fatigue, palpitations difficulty losing weight. Denies cold intolerance, constipation, skin changes, hair loss.    Depression screen PHQ 2/9 02/28/2018  Decreased Interest 1  Down, Depressed, Hopeless 1  PHQ - 2 Score 2  Altered sleeping 1  Tired, decreased energy 3  Change in appetite 1  Feeling bad or failure about yourself  0  Trouble concentrating 0  Moving slowly or fidgety/restless 0  Suicidal thoughts 0  PHQ-9 Score 7    GAD 7 : Generalized Anxiety Score 02/28/2018  Nervous, Anxious, on Edge 0  Control/stop worrying 0  Worry too much -  different things 1  Trouble relaxing 0  Restless 0  Easily annoyed or irritable 1  Afraid - awful might happen 0  Total GAD 7 Score 2    Patient Care Team: Trixie Dredge, PA-C as PCP - General (Physician Assistant) Janetta Hora, MD as Consulting Physician (Hematology) Moody Bruins, MD as Consulting Physician (Ophthalmology)   Past Medical History:  Diagnosis Date  . Allergic rhinitis due to other allergen   . Anemia   . Anxiety   . Asthma   . B12 deficiency 04/27/2016  . Bursitis, trochanteric   . Cellulitis   . Depression   . Headache   . Hypercholesteremia   . Hyperglycemia   . Hypothyroidism due to Hashimoto's thyroiditis 07/04/2017  . Insomnia   . Iron deficiency anemia 04/27/2016  . Morbid obesity ([place]) 07/04/2017  . Obesity   . Right lumbar radiculopathy 05/02/2013  . Sciatica   . Sinusitis   . Sleep apnea   . Vitamin B deficiency   . Vitamin D deficiency    Past Surgical History:  Procedure Laterality Date  . CHOLECYSTECTOMY N/A 04/27/2012   Procedure: LAPAROSCOPIC CHOLECYSTECTOMY;  Surgeon: Donato Heinz, MD;  Location: [facility];  Service: General;  Laterality: N/A;  . CHOLECYSTECTOMY    .  EYE SURGERY    . GASTRIC BYPASS    . ORTHOPEDIC SURGERY    . REFRACTIVE SURGERY    . WRIST SURGERY Left    Social History   Tobacco Use  . Smoking status: Never Smoker  . Smokeless tobacco: Never Used  Substance Use Topics  . Alcohol use: Yes    Alcohol/week: 1.0 standard drinks    Types: 1 Cans of beer per week    Comment: occas.   family history includes Cancer - Other in her sister; Colon cancer in her maternal grandmother; Gout in her mother; Heart Problems in her mother; Lung cancer in her father; Lupus in her mother.    ROS: Review of Systems  Constitutional: Positive for malaise/fatigue.  Cardiovascular: Positive for palpitations.  Genitourinary:       + incontinence  Musculoskeletal: Positive for joint pain (R knee).   Neurological: Positive for dizziness and headaches.  Psychiatric/Behavioral: Positive for depression.     Medications: Current Outpatient Medications  Medication Sig Dispense Refill  . Fexofenadine HCl (ALLERGY 24-HR PO) Take by mouth.    Marland Kitchen albuterol (PROVENTIL HFA;VENTOLIN HFA) 108 (90 BASE) MCG/ACT inhaler Inhale 2 puffs into the lungs every 6 (six) hours as needed for wheezing.    . Cholecalciferol (VITAMIN D PO) Take 1 capsule by mouth at bedtime.    . citalopram (CELEXA) 40 MG tablet Take 1 tablet (40 mg total) by mouth daily. 90 tablet 1  . cyanocobalamin (,VITAMIN B-12,) 1000 MCG/ML injection Inject 1 mL (1,000 mcg total) into the muscle every 14 (fourteen) days. 1 mL 5  . FOLIC ACID PO Take 1 tablet by mouth daily.    Marland Kitchen ibuprofen (ADVIL,MOTRIN) 200 MG tablet Take by mouth every 6 (six) hours as needed for pain.    Marland Kitchen levothyroxine (SYNTHROID, LEVOTHROID) 75 MCG tablet Take 1 tablet (75 mcg total) by mouth daily before breakfast. 90 tablet 0  . LORazepam (ATIVAN) 1 MG tablet Take 1 tablet (1 mg total) by mouth daily as needed for anxiety. 30 tablet 1  . meloxicam (MOBIC) 15 MG tablet Take 15 mg by mouth at bedtime.    . Multiple Vitamins-Minerals (BARIATRIC FUSION) CHEW 1 chew PO daily 90 tablet 3  . omeprazole (PRILOSEC) 20 MG capsule Take 1 capsule (20 mg total) by mouth daily as needed. 90 capsule 1  . RESTASIS 0.05 % ophthalmic emulsion     . traZODone (DESYREL) 50 MG tablet Take 1 tablet (50 mg total) by mouth at bedtime as needed for sleep. 90 tablet 0  . triamcinolone cream (KENALOG) 0.1 % Apply topically as needed.      No current facility-administered medications for this visit.    No Known Allergies     Objective:  BP 117/84   Pulse 68   Ht 5\' 6"  (1.676 m)   Wt (!) 369 lb (167.4 kg)   LMP 02/21/2018 (Approximate)   SpO2 97%   BMI 59.56 kg/m  Gen:  alert, not ill-appearing, no distress, appropriate for age, obese female HEENT: head normocephalic without  obvious abnormality, conjunctiva and cornea clear, wearing glasses, PERRL, EOMs intact Pulm: Normal work of breathing, normal phonation, clear to auscultation bilaterally, no wheezes, rales or rhonchi CV: Normal rate, regular rhythm, s1 and s2 distinct, no murmurs, clicks or rubs  Neuro: alert and oriented x 3, no tremor MSK: extremities atraumatic, normal gait and station Skin: intact, no rashes on exposed skin, no jaundice, no cyanosis Psych: well-groomed, cooperative, good eye contact, euthymic mood, affect  mood-congruent, speech is articulate, and thought processes clear and goal-directed  Lab Results  Component Value Date   CREATININE 0.74 04/11/2017   BUN 15 04/11/2017   NA 138 04/11/2017   K 3.9 04/11/2017   CL 105 04/11/2017   CO2 24 04/11/2017   Lab Results  Component Value Date   ALT 13 (L) 04/11/2017   AST 19 04/11/2017   ALKPHOS 49 04/11/2017   BILITOT 0.6 04/11/2017   No results found for: HGBA1C No results found for: CHOL, HDL, LDLCALC, LDLDIRECT, TRIG, CHOLHDL Lab Results  Component Value Date   TSH 5.76 05/01/2017     No results found for this or any previous visit (from the past 72 hour(s)). No results found.    Assessment and Plan: 53 y.o. female with   .Lus was seen today for establish care.  Diagnoses and all orders for this visit:  Encounter to establish care  History of colon polyps -     Ambulatory referral to Gastroenterology  H/O gastric bypass -     Multiple Vitamins-Minerals (BARIATRIC FUSION) CHEW; 1 chew PO daily  B12 deficiency -     Multiple Vitamins-Minerals (BARIATRIC FUSION) CHEW; 1 chew PO daily  Colon cancer screening -     Ambulatory referral to Gastroenterology  Hypothyroidism due to Hashimoto's thyroiditis -     levothyroxine (SYNTHROID, LEVOTHROID) 75 MCG tablet; Take 1 tablet (75 mcg total) by mouth daily before breakfast.  Aura  Nontraumatic pain of right shoulder -     DG Shoulder Right  Right knee pain,  unspecified chronicity -     DG Knee Complete 4 Views Right  Chronic insomnia -     traZODone (DESYREL) 50 MG tablet; Take 1 tablet (50 mg total) by mouth at bedtime as needed for sleep.  Chronic use of benzodiazepine for therapeutic purpose  Chronic anxiety  Other orders -     Discontinue: LORazepam (ATIVAN) 1 MG tablet; Take 1 tablet (1 mg total) by mouth daily as needed for anxiety. -     Discontinue: omeprazole (PRILOSEC) 20 MG capsule; Take 1 capsule (20 mg total) by mouth daily as needed. -     Discontinue: citalopram (CELEXA) 40 MG tablet; Take 1 tablet (40 mg total) by mouth daily.     - Personally reviewed PMH, PSH, PFH, medications, allergies, HM - Personally reviewed labs in Care Everywhere dated 10/30/17 - Age-appropriate cancer screening: Mammogram, Pap  UTD per patient, awaiting records from Dr. Phyllis Ginger; Colonoscopy due Q5Y, referral placed - Influenza declined - Tdap unknown  Chronic anxiety - GAD7=2 - chronic prescription benzodiazepine use - personally reviewed NCCSRS, no red flags - discussed risks associated with long-term benzo use - reducing dose to 0.5-1 mg QD prn for anxiety - cont Celexa 40 mg QD - reducing Trazodone to 50 mg QHS x 1 week, then 25 mg QHS 1 week, then prn  Vision change - episodic visual disturbance lasting 1 hour or less in the setting of history of migraines is most likely a migraine aura - encouraged patient to schedule eye exam with her eye doctor  Referred to Sports Medicine for joint pain and arthritis. X-rays pending  Patient education and anticipatory guidance given Patient agrees with treatment plan Follow-up in the next 1-14 days to discuss additional health concerns as needed if symptoms worsen or fail to improve  Darlyne Russian PA-C

## 2018-02-28 NOTE — Patient Instructions (Signed)
What to Know About Migraine With Aura Doctors and patients alike have been fascinated and puzzled by how migraine and aura go hand in hand. In fact, when diagnosing an individual with migraine, people may fall into the category of migraine with aura or migraine without aura, or both. Often described as a "warning sign," the aura stage of migraine is unique because it is only experienced by 25 to 30% of people with migraine.  What is Aura? Kristy Sharp is a series of sensory disturbances that happen shortly before a migraine attack. These disturbances range from seeing sparks, bright dots, and zig zags to tingling on one side of the body or an inability to speak clearly, and usually last 20-60 minutes. Aura is of particular interest to doctors and researchers as it doesn't affect every person with migraine, and it generally doesn't occur during every migraine attack.  Migraine Types with Aura Because of the varying levels of sensory changes, aura can be alarming to experience, which is why being able to determine what type of headache you have can help to understand your symptoms and the progression of an attack. It can also be invaluable information to share with your doctor. The types of migraine known to have an aura stage are:  Migraine with aura-with a headache or without a headache Migraine with brainstem aura Hemiplegic migraine Retinal migraine Different types of Aura and Symptoms Those who have migraine aura may have a range of symptoms, including:  Vision Disturbances: Seeing spots, flashes, zig zags, stars, or even losing sight for short periods of time. Sensory Changes: Feeling tingling or numbness in the face, body, hands, and fingers. Speech or Language Problems: Unable to produce the right words, slurring or mumbling words. Coping with Aura and Treatment Treating migraines with aura should consist of a combination of acute and preventative medication. However, according to Ricky Ala, MD,  if your symptoms have an immediate onset, last longer than 60 minutes or do not completely resolve, medical attention is required. If you experience additional symptoms, such as weakness on one side of the body, change in consciousness or level of alertness, it's time to see a doctor. Migraine with aura also increases the risk of stroke - because of this, treating other stroke risk factors and avoiding certain medications is important and should be discussed with your physician.  If you would like more information on migraine and aura, visit the [employer] [facility] [employer], or use our doctor database to locate a specialist in your area.

## 2018-03-05 ENCOUNTER — Other Ambulatory Visit: Payer: Self-pay

## 2018-03-05 MED ORDER — OMEPRAZOLE 20 MG PO CPDR: 20.0000 mg | DELAYED_RELEASE_CAPSULE | Freq: Every day | ORAL | 1 refills | Status: DC | PRN

## 2018-03-05 MED ORDER — CITALOPRAM HYDROBROMIDE 40 MG PO TABS: 40.0000 mg | ORAL_TABLET | Freq: Every day | ORAL | 1 refills | Status: DC

## 2018-03-05 MED ORDER — LORAZEPAM 1 MG PO TABS: 1.0000 mg | ORAL_TABLET | Freq: Every day | ORAL | 1 refills | Status: DC | PRN

## 2018-03-05 NOTE — Progress Notes (Signed)
Signed.

## 2018-03-06 ENCOUNTER — Encounter: Payer: Self-pay | Admitting: Physician Assistant

## 2018-03-06 MED ORDER — LORAZEPAM 1 MG PO TABS: 1.0000 mg | ORAL_TABLET | Freq: Every day | ORAL | 1 refills | Status: DC | PRN

## 2018-03-06 NOTE — Addendum Note (Signed)
Addended by: Alena Bills R on: 03/06/2018 03:45 PM   Modules accepted: Orders

## 2018-03-08 ENCOUNTER — Encounter: Payer: Self-pay | Admitting: Physician Assistant

## 2018-03-08 DIAGNOSIS — M25561 Pain in right knee: Secondary | ICD-10-CM | POA: Insufficient documentation

## 2018-03-08 DIAGNOSIS — M25511 Pain in right shoulder: Secondary | ICD-10-CM | POA: Insufficient documentation

## 2018-03-08 DIAGNOSIS — F5104 Psychophysiologic insomnia: Secondary | ICD-10-CM | POA: Insufficient documentation

## 2018-03-08 DIAGNOSIS — F419 Anxiety disorder, unspecified: Secondary | ICD-10-CM | POA: Insufficient documentation

## 2018-03-08 DIAGNOSIS — Z79899 Other long term (current) drug therapy: Secondary | ICD-10-CM | POA: Insufficient documentation

## 2018-03-12 ENCOUNTER — Ambulatory Visit (INDEPENDENT_AMBULATORY_CARE_PROVIDER_SITE_OTHER): Payer: BLUE CROSS/BLUE SHIELD | Admitting: Physician Assistant

## 2018-03-12 ENCOUNTER — Institutional Professional Consult (permissible substitution): Payer: BLUE CROSS/BLUE SHIELD | Admitting: Family Medicine

## 2018-03-12 ENCOUNTER — Encounter: Payer: Self-pay | Admitting: Physician Assistant

## 2018-03-12 ENCOUNTER — Ambulatory Visit (INDEPENDENT_AMBULATORY_CARE_PROVIDER_SITE_OTHER): Payer: BLUE CROSS/BLUE SHIELD

## 2018-03-12 VITALS — BP 126/77 | HR 60 | Wt 367.0 lb

## 2018-03-12 DIAGNOSIS — R519 Headache, unspecified: Secondary | ICD-10-CM

## 2018-03-12 DIAGNOSIS — E538 Deficiency of other specified B group vitamins: Secondary | ICD-10-CM

## 2018-03-12 DIAGNOSIS — E509 Vitamin A deficiency, unspecified: Secondary | ICD-10-CM

## 2018-03-12 DIAGNOSIS — G8929 Other chronic pain: Secondary | ICD-10-CM

## 2018-03-12 DIAGNOSIS — R51 Headache: Secondary | ICD-10-CM

## 2018-03-12 DIAGNOSIS — K219 Gastro-esophageal reflux disease without esophagitis: Secondary | ICD-10-CM

## 2018-03-12 DIAGNOSIS — E038 Other specified hypothyroidism: Secondary | ICD-10-CM | POA: Diagnosis not present

## 2018-03-12 DIAGNOSIS — E063 Autoimmune thyroiditis: Secondary | ICD-10-CM | POA: Diagnosis not present

## 2018-03-12 DIAGNOSIS — J3489 Other specified disorders of nose and nasal sinuses: Secondary | ICD-10-CM | POA: Diagnosis not present

## 2018-03-12 DIAGNOSIS — R7989 Other specified abnormal findings of blood chemistry: Secondary | ICD-10-CM | POA: Diagnosis not present

## 2018-03-12 DIAGNOSIS — J329 Chronic sinusitis, unspecified: Secondary | ICD-10-CM

## 2018-03-12 DIAGNOSIS — I872 Venous insufficiency (chronic) (peripheral): Secondary | ICD-10-CM | POA: Diagnosis not present

## 2018-03-12 DIAGNOSIS — Z9884 Bariatric surgery status: Secondary | ICD-10-CM

## 2018-03-12 DIAGNOSIS — E211 Secondary hyperparathyroidism, not elsewhere classified: Secondary | ICD-10-CM

## 2018-03-12 DIAGNOSIS — E569 Vitamin deficiency, unspecified: Secondary | ICD-10-CM | POA: Diagnosis not present

## 2018-03-12 DIAGNOSIS — F419 Anxiety disorder, unspecified: Secondary | ICD-10-CM

## 2018-03-12 MED ORDER — BARIATRIC FUSION PO CHEW: CHEWABLE_TABLET | ORAL | 3 refills | Status: DC

## 2018-03-12 MED ORDER — OMEPRAZOLE 20 MG PO CPDR: 20.0000 mg | DELAYED_RELEASE_CAPSULE | Freq: Every day | ORAL | 1 refills | Status: DC | PRN

## 2018-03-12 MED ORDER — NONFORMULARY OR COMPOUNDED ITEM: 0 refills | Status: DC

## 2018-03-12 MED ORDER — CITALOPRAM HYDROBROMIDE 40 MG PO TABS: 40.0000 mg | ORAL_TABLET | Freq: Every day | ORAL | 1 refills | Status: DC

## 2018-03-12 MED ORDER — FUROSEMIDE 20 MG PO TABS: 20.0000 mg | ORAL_TABLET | Freq: Every day | ORAL | 3 refills | Status: AC | PRN

## 2018-03-12 MED ORDER — LEVOTHYROXINE SODIUM 75 MCG PO TABS: 75.0000 ug | ORAL_TABLET | Freq: Every day | ORAL | 0 refills | Status: DC

## 2018-03-12 NOTE — Patient Instructions (Signed)

## 2018-03-12 NOTE — Progress Notes (Signed)
HPI:                                                                Kristy Sharp is a 53 y.o. female who presents to [facility] [place] today for follow-up to discuss multiple concerns  Leg swelling: Patient reports chronic bilateral lower extremity swelling worse on the right side.  At times she develops a pruritic rash of her right lower leg and ankle.  Reports she has had to be treated for cellulitis on several occasions.  Denies any history of wound or ulcer.   Headaches/Sinuses: reports she chronically feels nasal congestion and sinus pressure, worse in fall and spring. she has a history of environmental allergies and has completed immunotherapy in the remote past.  She occasionally coughs up clear mucus. She recently was treated with antibiotics and steroids in December for sinusitis and bronchitis. Has tried Singulair, OTC allegra and zyrtec, with minimal relief.  Not currently taking any antihistamines or leukotriene antagonists.  Currently using netti-pot, which is helpful Also has recurrent right-sided headaches behind her eye, 2-3 times per week described as pressure lasting for hours-full day.  Attributes headaches to sinus headache, but has been diagnosed with migraines in the past and at previous office visit described aura-like symptoms that included brief visual disturbance described as a "wave coming across" lasting for less than an hour with normal vision in between episodes.  Chronic fatigue: Reports feeling tired all of the time despite adequate sleep. Denies personal history of sleep apnea.  She also had an issue with her pharmacy and is requesting refills of all of her medications again to go to the correct pharmacy.  Past Medical History:  Diagnosis Date  . Allergic rhinitis due to other allergen   . Allergy   . Anemia   . Anxiety   . Asthma   . B12 deficiency 04/27/2016  . Bursitis, trochanteric   . Cellulitis   . Depression    . Headache   . Hypercholesteremia   . Hyperglycemia   . Hypothyroidism due to Hashimoto's thyroiditis 07/04/2017  . Insomnia   . Iron deficiency anemia 04/27/2016  . Morbid obesity ([place]) 07/04/2017  . Obesity   . Right lumbar radiculopathy 05/02/2013  . Sciatica   . Sinusitis   . Sleep apnea   . Vitamin B deficiency   . Vitamin D deficiency    Past Surgical History:  Procedure Laterality Date  . CHOLECYSTECTOMY N/A 04/27/2012   Procedure: LAPAROSCOPIC CHOLECYSTECTOMY;  Surgeon: Donato Heinz, MD;  Location: [facility];  Service: General;  Laterality: N/A;  . CHOLECYSTECTOMY    . EYE SURGERY    . GASTRIC BYPASS    . ORTHOPEDIC SURGERY    . REFRACTIVE SURGERY    . WRIST SURGERY Left    Social History   Tobacco Use  . Smoking status: Never Smoker  . Smokeless tobacco: Never Used  Substance Use Topics  . Alcohol use: Yes    Alcohol/week: 1.0 standard drinks    Types: 1 Cans of beer per week    Comment: occas.   family history includes Cancer - Other in her sister; Colon cancer in her maternal grandmother; Gout in her mother; Heart Problems in her mother; Lung cancer in  her father; Lupus in her mother.    ROS:  Review of Systems  Constitutional: Positive for malaise/fatigue.  Cardiovascular: Positive for palpitations.  Genitourinary:       + incontinence  Musculoskeletal: Positive for joint pain (R knee).  Neurological: Positive for dizziness and headaches.  Psychiatric/Behavioral: Positive for depression.    Medications: Current Outpatient Medications  Medication Sig Dispense Refill  . albuterol (PROVENTIL HFA;VENTOLIN HFA) 108 (90 BASE) MCG/ACT inhaler Inhale 2 puffs into the lungs every 6 (six) hours as needed for wheezing.    . Cholecalciferol (VITAMIN D PO) Take 1 capsule by mouth at bedtime.    . citalopram (CELEXA) 40 MG tablet Take 1 tablet (40 mg total) by mouth daily. 90 tablet 1  . cyanocobalamin (,VITAMIN B-12,) 1000 MCG/ML injection Inject 1 mL (1,000 mcg  total) into the muscle every 14 (fourteen) days. 1 mL 5  . Fexofenadine HCl (ALLERGY 24-HR PO) Take by mouth.    . FOLIC ACID PO Take 1 tablet by mouth daily.    . furosemide (LASIX) 20 MG tablet Take 1 tablet (20 mg total) by mouth daily as needed. 30 tablet 3  . ibuprofen (ADVIL,MOTRIN) 200 MG tablet Take by mouth every 6 (six) hours as needed for pain.    Marland Kitchen levothyroxine (SYNTHROID, LEVOTHROID) 75 MCG tablet Take 1 tablet (75 mcg total) by mouth daily before breakfast. 30 tablet 0  . LORazepam (ATIVAN) 1 MG tablet Take 1 tablet (1 mg total) by mouth daily as needed for anxiety. 30 tablet 1  . meloxicam (MOBIC) 15 MG tablet Take 15 mg by mouth at bedtime.    . Multiple Vitamins-Minerals (BARIATRIC FUSION) CHEW 1 chew PO daily 90 tablet 3  . NONFORMULARY OR COMPOUNDED ITEM Bilateral knee-high compression leg wraps. Apply to lower extremities daily 2 each 0  . omeprazole (PRILOSEC) 20 MG capsule Take 1 capsule (20 mg total) by mouth daily as needed. 90 capsule 1  . RESTASIS 0.05 % ophthalmic emulsion     . traZODone (DESYREL) 50 MG tablet Take 1 tablet (50 mg total) by mouth at bedtime as needed for sleep. 90 tablet 0  . triamcinolone cream (KENALOG) 0.1 % Apply topically as needed.      No current facility-administered medications for this visit.    No Known Allergies     Objective:  BP 126/77   Pulse 60   Wt (!) 367 lb (166.5 kg)   LMP 02/21/2018 (Approximate)   BMI 59.24 kg/m  Gen:  alert, not ill-appearing, no distress, appropriate for age, obese female HEENT: head normocephalic without obvious abnormality, conjunctiva and cornea clear, wearing glasses, tympanic membranes pearly [place] and semitransparent bilaterally, external ear canals normal bilaterally, no mastoid tenderness, nasal mucosa edematous, nares patent, normal septum, oropharynx clear, tonsils grade 1+, uvula midline, neck supple, no cervical adenopathy, trachea midline Pulm: Normal work of breathing, normal phonation,  clear to auscultation bilaterally, no wheezes, rales or rhonchi CV: Normal rate, regular rhythm, s1 and s2 distinct, no murmurs, clicks or rubs  Neuro: alert and oriented x 3, no tremor MSK: extremities atraumatic, normal gait and station, bilateral nonpitting edema right worse than left Skin: intact, venous stasis dermatitis of bilateral distal lower extremities, no induration, no ulcers Psych: well-groomed, cooperative, good eye contact, euthymic mood, affect mood-congruent, speech is articulate, and thought processes clear and goal-directed  Lab Results  Component Value Date   CREATININE 0.74 04/11/2017   BUN 15 04/11/2017   NA 138 04/11/2017   K 3.9  04/11/2017   CL 105 04/11/2017   CO2 24 04/11/2017   Lab Results  Component Value Date   ALT 13 (L) 04/11/2017   AST 19 04/11/2017   ALKPHOS 49 04/11/2017   BILITOT 0.6 04/11/2017   Lab Results  Component Value Date   TSH 5.76 05/01/2017   Lab Results  Component Value Date   WBC 6.0 07/07/2017   HGB 12.5 07/07/2017   HCT 38.6 07/07/2017   MCV 101.3 (H) 07/07/2017   PLT 216 07/07/2017      No results found for this or any previous visit (from the past 72 hour(s)). Ct Maxillofacial Limited Wo Contrast  Result Date: 03/12/2018 CLINICAL DATA:  Recurrent sinus infection.  Headache. EXAM: CT PARANASAL SINUS LIMITED WITHOUT CONTRAST TECHNIQUE: Non-contiguous multidetector CT images of the paranasal sinuses were obtained in a single plane without contrast. COMPARISON:  None. FINDINGS: Paranasal sinuses well developed and clear. No significant mucosal edema. No air-fluid level. No acute skeletal abnormality. IMPRESSION: Negative Electronically Signed   By: Franchot Gallo M.D.   On: 03/12/2018 12:46      Assessment and Plan: 53 y.o. female with   .Elwanda was seen today for follow-up.  Diagnoses and all orders for this visit:  Recurrent sinus infections -     CT MAXILLOFACIAL LIMITED WO CONTRAST  Hypothyroidism due to  Hashimoto's thyroiditis -     levothyroxine (SYNTHROID, LEVOTHROID) 75 MCG tablet; Take 1 tablet (75 mcg total) by mouth daily before breakfast. -     Thyroid Panel With TSH -     PTH, Intact and Calcium  H/O gastric bypass -     Multiple Vitamins-Minerals (BARIATRIC FUSION) CHEW; 1 chew PO daily -     omeprazole (PRILOSEC) 20 MG capsule; Take 1 capsule (20 mg total) by mouth daily as needed. -     Thyroid Panel With TSH -     COMPLETE METABOLIC PANEL WITH GFR -     B12 and Folate Panel -     VITAMIN D 25 Hydroxy (Vit-D Deficiency, Fractures) -     Vitamin A -     Vitamin E -     Vitamin K1, Serum -     Vitamin B1 -     PTH, Intact and Calcium  B12 deficiency -     Multiple Vitamins-Minerals (BARIATRIC FUSION) CHEW; 1 chew PO daily -     B12 and Folate Panel  Chronic right-sided headache -     CT MAXILLOFACIAL LIMITED WO CONTRAST  Vitamin deficiency -     COMPLETE METABOLIC PANEL WITH GFR -     B12 and Folate Panel -     VITAMIN D 25 Hydroxy (Vit-D Deficiency, Fractures) -     Vitamin A -     Vitamin E -     Vitamin K1, Serum -     Vitamin B1 -     PTH, Intact and Calcium  Edema of both lower extremities due to peripheral venous insufficiency -     NONFORMULARY OR COMPOUNDED ITEM; Bilateral knee-high compression leg wraps. Apply to lower extremities daily -     furosemide (LASIX) 20 MG tablet; Take 1 tablet (20 mg total) by mouth daily as needed.  Chronic anxiety -     citalopram (CELEXA) 40 MG tablet; Take 1 tablet (40 mg total) by mouth daily.  Gastroesophageal reflux disease, esophagitis presence not specified -     omeprazole (PRILOSEC) 20 MG capsule; Take 1 capsule (20 mg total) by  mouth daily as needed.     Chronic fatigue: Likely multifactorial in the setting of iron deficiency, hypothyroidism, perimenopausal state, depression and anxiety.  Given her history of Roux-en-Y gastric bypass and lack of vitamin supplementation I will check for other vitamin  deficiencies today.  We will also check TSH to make sure current dose of levothyroxine is therapeutic.   Allergic rhinitis: Antihistamine, leukotriene antagonist and intranasal corticosteroid daily.  Continue Nettie pot.  CT sinus pending.   Headache: Symptoms are more consistent with migraine headache then sinus headache, unilateral right-sided episodic headaches lasting hours to days sometimes preceded by a visual aura.  We will obtain a CT sinus today to rule out chronic sinusitis.  Venous insufficiency: Right side worse than left likely due to dependent edema from her right knee osteoarthritis.  Given the circumference of her extremities I think she would tolerate compression wraps better than stockings.  Prescription provided and patient instructed to bring to medical supply store.  Lasix as needed for moderate to severe swelling.  Counseled on general measures including regular exercise and elevating her legs as much as possible.  Patient education and anticipatory guidance given Patient agrees with treatment plan Follow-up in 3 months or sooner as needed if symptoms worsen or fail to improve  Darlyne Russian PA-C

## 2018-03-14 ENCOUNTER — Encounter: Payer: Self-pay | Admitting: Physician Assistant

## 2018-03-14 DIAGNOSIS — J3089 Other allergic rhinitis: Secondary | ICD-10-CM

## 2018-03-14 MED ORDER — CETIRIZINE HCL 10 MG PO TABS: 10.0000 mg | ORAL_TABLET | Freq: Every day | ORAL | 3 refills | Status: DC

## 2018-03-14 MED ORDER — MONTELUKAST SODIUM 10 MG PO TABS: 10.0000 mg | ORAL_TABLET | Freq: Every day | ORAL | 3 refills | Status: DC

## 2018-03-15 LAB — COMPLETE METABOLIC PANEL WITH GFR
AG Ratio: 1.4 (calc) (ref 1.0–2.5)
ALKALINE PHOSPHATASE (APISO): 56 U/L (ref 33–130)
ALT: 8 U/L (ref 6–29)
AST: 16 U/L (ref 10–35)
Albumin: 3.5 g/dL — ABNORMAL LOW (ref 3.6–5.1)
BUN: 15 mg/dL (ref 7–25)
CHLORIDE: 110 mmol/L (ref 98–110)
CO2: 24 mmol/L (ref 20–32)
Calcium: 8.8 mg/dL (ref 8.6–10.4)
Creat: 0.62 mg/dL (ref 0.50–1.05)
GFR, Est African American: 120 mL/min/{1.73_m2} (ref >=60)
GFR, Est Non African American: 104 mL/min/{1.73_m2} (ref >=60)
Globulin: 2.5 g/dL (calc) (ref 1.9–3.7)
Glucose, Bld: 95 mg/dL (ref 65–99)
Potassium: 4.1 mmol/L (ref 3.5–5.3)
Sodium: 141 mmol/L (ref 135–146)
Total Bilirubin: 0.4 mg/dL (ref 0.2–1.2)
Total Protein: 6 g/dL — ABNORMAL LOW (ref 6.1–8.1)

## 2018-03-15 LAB — VITAMIN E
Gamma-Tocopherol (Vit E): 1.2 mg/L (ref <=4.3)
Vitamin E (Alpha Tocopherol): 7.2 mg/L (ref 5.7–19.9)

## 2018-03-15 LAB — THYROID PANEL WITH TSH
Free Thyroxine Index: 2.7 (ref 1.4–3.8)
T3 Uptake: 32 % (ref 22–35)
T4 TOTAL: 8.5 ug/dL (ref 5.1–11.9)
TSH: 3.47 mIU/L

## 2018-03-15 LAB — VITAMIN A: Vitamin A (Retinoic Acid): 28 ug/dL — ABNORMAL LOW (ref 38–98)

## 2018-03-15 LAB — B12 AND FOLATE PANEL: Folate: 20.4 ng/mL

## 2018-03-15 LAB — VITAMIN B1: Vitamin B1 (Thiamine): 8 nmol/L (ref 8–30)

## 2018-03-15 LAB — PTH, INTACT AND CALCIUM
Calcium: 8.8 mg/dL (ref 8.6–10.4)
PTH: 82 pg/mL — ABNORMAL HIGH (ref 14–64)

## 2018-03-15 LAB — VITAMIN D 25 HYDROXY (VIT D DEFICIENCY, FRACTURES): Vit D, 25-Hydroxy: 47 ng/mL (ref 30–100)

## 2018-03-15 LAB — VITAMIN K1, SERUM: Vitamin K: 132 pg/mL (ref 130–1500)

## 2018-03-20 ENCOUNTER — Encounter: Payer: Self-pay | Admitting: Family Medicine

## 2018-03-20 ENCOUNTER — Ambulatory Visit (INDEPENDENT_AMBULATORY_CARE_PROVIDER_SITE_OTHER): Payer: BLUE CROSS/BLUE SHIELD | Admitting: Family Medicine

## 2018-03-20 VITALS — BP 118/72 | HR 75 | Ht 66.0 in | Wt 369.0 lb

## 2018-03-20 DIAGNOSIS — M25561 Pain in right knee: Secondary | ICD-10-CM

## 2018-03-20 DIAGNOSIS — E211 Secondary hyperparathyroidism, not elsewhere classified: Secondary | ICD-10-CM | POA: Insufficient documentation

## 2018-03-20 MED ORDER — DICLOFENAC SODIUM 1 % TD GEL: 4.0000 g | Freq: Four times a day (QID) | TRANSDERMAL | 11 refills | Status: DC

## 2018-03-20 MED ORDER — VITAMIN A 10000 UNITS PO TABS: 5000.0000 [IU] | ORAL_TABLET | Freq: Every day | ORAL | 0 refills | Status: AC

## 2018-03-20 NOTE — Progress Notes (Signed)
Subjective:    I'm seeing this patient as a consultation for:  Trixie Dredge, PA-C   CC: Right knee pain  HPI: [place] has a several month history of right knee pain.  In October she felt a pulling sensation while walking and has had some pain and swelling since then.  She denies any specific injury.  She notes grinding and clicking and popping but denies severe locking.  She notes some catching sensations at times however.  She started treatment with some over-the-counter Tylenol ibuprofen or Aleve.  Additionally she is tried meloxicam ice and heat.  She is had mild success with this but not sufficient to control her symptoms.  She rates her pain is moderate and interfering with her ability to exercise normally.  She was seen by her primary care provider January 8 and had an x-ray that showed some moderate degenerative changes.  She was referred here today for evaluation and treatment.  Past medical history, Surgical history, Family history not pertinant except as noted below, Social history, Allergies, and medications have been entered into the medical record, reviewed, and no changes needed.   Review of Systems: No headache, visual changes, nausea, vomiting, diarrhea, constipation, dizziness, abdominal pain, skin rash, fevers, chills, night sweats, weight loss, swollen lymph nodes, body aches, joint swelling, muscle aches, chest pain, shortness of breath, mood changes, visual or auditory hallucinations.   Objective:    Vitals:   03/20/18 0845  BP: 118/72  Pulse: 75   General: Well Developed, well nourished, and in no acute distress.  Morbidly obese Neuro/Psych: Alert and oriented x3, extra-ocular muscles intact, able to move all 4 extremities, sensation grossly intact. Skin: Warm and dry, no rashes noted.  Respiratory: Not using accessory muscles, speaking in full sentences, trachea midline.  Cardiovascular: Pulses palpable, no extremity edema. Abdomen: Does not appear  distended. MSK:  Right knee: Morbidly obese effusion difficult to evaluate.  No erythema. Range of motion 0-100 degrees with mild retropatellar crepitations. Tender palpation medial and lateral joint lines. Stable ligamentous exam. Mildly positive medial McMurray's test. Intact flexion and extension strength.  Left knee similar appearance to right knee. Normal range of motion with crepitations. Not particularly tender. Stable ligamentous exam.  Antalgic gait present.  Lab and Radiology Results EXAM: RIGHT KNEE - COMPLETE 4+ VIEW  COMPARISON:  04/05/2013  FINDINGS: No evidence of fracture, dislocation, or joint effusion. There has been mild progression in tricompartmental degenerative spurring since previous study, without significant joint space narrowing. No other bone lesions identified. Soft tissues are unremarkable.  IMPRESSION: 1. No acute findings. 2. Interval progression of mild tricompartmental osteoarthritis.   Electronically Signed   By: Earle Gell M.D.   On: 03/01/2018 08:45 I personally (independently) visualized and performed the interpretation of the images attached in this note.   Limited musculoskeletal ultrasound right knee: No significant effusion and suprapatellar space. Intact quadriceps and patellar tendons. Degenerative changes present at medial and lateral joint line however meniscus does appear to be intact without visible superficial tear. No Baker's cyst present. Bony structures no visible fractures.  Impression and Recommendations:    Assessment and Plan: 53 y.o. female with  Right knee pain: Exacerbation of arthritis versus degenerative meniscus tear likely cause of pain.  We discussed options.  Patient would like to try conservative methods before proceeding with injection.  Plan to work on quadricep strengthening.  We discussed several options including straight leg raises exercise bike and water aerobics.  Additionally weight  loss will  certainly help as well which we discussed.  We will additionally will use diclofenac gel.  She is a good candidate for this as she has had trials of several different oral NSAIDs with little benefit.  Recheck in a few weeks.  Neck step would likely be injection followed by MRI.  PDMP not reviewed this encounter. No orders of the defined types were placed in this encounter.  Meds ordered this encounter  Medications  . diclofenac sodium (VOLTAREN) 1 % GEL    Sig: Apply 4 g topically 4 (four) times daily. To affected joint.    Dispense:  100 g    Refill:  11    Knee OA. Tried and failed meloxicam, ibuprofen and aleve    Discussed warning signs or symptoms. Please see discharge instructions. Patient expresses understanding.

## 2018-03-20 NOTE — Patient Instructions (Signed)
Thank you for coming in today. Work on Astronomer.  Do the straight leg raises in the bed.  Do about 30 reps 2x daily.   I also recommend recumbent exercise bike and water aerobics.   Weight loss will help as well.   Use the diclofenac gel up to 4x daily for pain.    Recheck for injection if not improving.   I can send 90 day supplies of the diclofenac gel to your mail order pharmacy if it works.    Meniscus Tear  A meniscus tear is a knee injury that happens when a piece of the meniscus is torn. The meniscus is a thick, rubbery, wedge-shaped cartilage in the knee. Two menisci are located in each knee. They sit between the upper bone (femur) and lower bone (tibia) that make up the knee joint. Each meniscus acts as a shock absorber for the knee. A torn meniscus is one of the most common types of knee injuries. This injury can range from mild to severe. Surgery may be needed to repair a severe tear. What are the causes? This condition may be caused by any kneeling, squatting, twisting, or pivoting movement. Sports-related injuries are the most common cause. These often occur from:  Running and stopping suddenly. ? Changing direction. ? Being tackled or knocked off your feet.  Lifting or carrying heavy weights. As people get older, their menisci get thinner and weaker. In these people, tears can happen more easily, such as from climbing stairs. What increases the risk? You are more likely to develop this condition if you:  Play contact sports.  Have a job that requires kneeling or squatting.  Are female.  Are over 73 years old. What are the signs or symptoms? Symptoms of this condition include:  Knee pain, especially at the side of the knee joint. You may feel pain when the injury occurs, or you may only hear a pop and feel pain later.  A feeling that your knee is clicking, catching, locking, or giving way.  Not being able to fully bend or extend your knee.  Bruising or  swelling in your knee. How is this diagnosed? This condition may be diagnosed based on your symptoms and a physical exam. You may also have tests, such as:  X-rays.  MRI.  A procedure to look inside your knee with a narrow surgical telescope (arthroscopy). You may be referred to a knee specialist (orthopedic surgeon). How is this treated? Treatment for this injury depends on the severity of the tear. Treatment for a mild tear may include:  Rest.  Medicine to reduce pain and swelling. This is usually a nonsteroidal anti-inflammatory drug (NSAID), like ibuprofen.  A knee brace, sleeve, or wrap.  Using crutches or a walker to keep weight off your knee and to help you walk.  Exercises to strengthen your knee (physical therapy). You may need surgery if you have a severe tear or if other treatments are not working. Follow these instructions at home: If you have a brace, sleeve, or wrap:  Wear it as told by your health care provider. Remove it only as told by your health care provider.  Loosen the brace, sleeve, or wrap if your toes tingle, become numb, or turn cold and blue.  Keep the brace, sleeve, or wrap clean and dry.  If the brace, sleeve, or wrap is not waterproof: ? Do not let it get wet. ? Cover it with a watertight covering when you take a bath or shower.  Managing pain and swelling   Take over-the-counter and prescription medicines only as told by your health care provider.  If directed, put ice on your knee: ? If you have a removable brace, sleeve, or wrap, remove it as told by your health care provider. ? Put ice in a plastic bag. ? Place a towel between your skin and the bag. ? Leave the ice on for 20 minutes, 2-3 times per day.  Move your toes often to avoid stiffness and to lessen swelling.  Raise (elevate) the injured area above the level of your heart while you are sitting or lying down. Activity  Do not use the injured limb to support your body weight  until your health care provider says that you can. Use crutches or a walker as told by your health care provider.  Return to your normal activities as told by your health care provider. Ask your health care provider what activities are safe for you.  Perform range-of-motion exercises only as told by your health care provider.  Begin doing exercises to strengthen your knee and leg muscles only as told by your health care provider. After you recover, your health care provider may recommend these exercises to help prevent another injury. General instructions  Use a knee brace, sleeve, or wrap as told by your health care provider.  Ask your health care provider when it is safe to drive if you have a brace, sleeve, or wrap on your knee.  Do not use any products that contain nicotine or tobacco, such as cigarettes, e-cigarettes, and chewing tobacco. If you need help quitting, ask your health care provider.  Ask your health care provider if the medicine prescribed to you: ? Requires you to avoid driving or using heavy machinery. ? Can cause constipation. You may need to take these actions to prevent or treat constipation:  Drink enough fluid to keep your urine pale yellow.  Take over-the-counter or prescription medicines.  Eat foods that are high in fiber, such as beans, whole grains, and fresh fruits and vegetables.  Limit foods that are high in fat and processed sugars, such as fried or sweet foods.  Keep all follow-up visits as told by your health care provider. This is important. Contact a health care provider if:  You have a fever.  Your knee becomes red, tender, or swollen.  Your pain medicine is not helping.  Your symptoms get worse or do not improve after 2 weeks of home care. Summary  A meniscus tear is a knee injury that happens when a piece of the meniscus is torn.  Treatment for this injury depends on the severity of the tear. You may need surgery if you have a severe  tear or if other treatments are not working.  Rest, ice, and raise (elevate) your injured knee as told by your health care provider. This will help lessen pain and swelling.  Contact a health care provider if you have new symptoms, or your symptoms get worse or do not improve after 2 weeks of home care.  Keep all follow-up visits as told by your health care provider. This is important. This information is not intended to replace advice given to you by your health care provider. Make sure you discuss any questions you have with your health care provider. Document Released: 04/30/2002 Document Revised: 08/22/2017 Document Reviewed: 08/22/2017 Elsevier Interactive Patient Education  2019 [employer].

## 2018-03-20 NOTE — Addendum Note (Signed)
Addended by: Nelson Chimes E on: 03/20/2018 01:35 PM   Modules accepted: Orders

## 2018-03-21 ENCOUNTER — Encounter: Payer: Self-pay | Admitting: Physician Assistant

## 2018-03-23 DIAGNOSIS — E349 Endocrine disorder, unspecified: Secondary | ICD-10-CM | POA: Diagnosis not present

## 2018-04-01 ENCOUNTER — Encounter: Payer: Self-pay | Admitting: Physician Assistant

## 2018-04-01 DIAGNOSIS — F5104 Psychophysiologic insomnia: Secondary | ICD-10-CM

## 2018-04-02 ENCOUNTER — Other Ambulatory Visit: Payer: Self-pay

## 2018-04-02 DIAGNOSIS — E063 Autoimmune thyroiditis: Principal | ICD-10-CM

## 2018-04-02 DIAGNOSIS — E038 Other specified hypothyroidism: Secondary | ICD-10-CM

## 2018-04-02 MED ORDER — LEVOTHYROXINE SODIUM 75 MCG PO TABS: 75.0000 ug | ORAL_TABLET | Freq: Every day | ORAL | 1 refills | Status: DC

## 2018-04-03 MED ORDER — LORAZEPAM 1 MG PO TABS: 1.0000 mg | ORAL_TABLET | Freq: Every day | ORAL | 0 refills | Status: DC | PRN

## 2018-04-03 MED ORDER — TRAZODONE HCL 50 MG PO TABS: 50.0000 mg | ORAL_TABLET | Freq: Every evening | ORAL | 0 refills | Status: DC | PRN

## 2018-04-03 MED ORDER — LEVOTHYROXINE SODIUM 75 MCG PO TABS: 75.0000 ug | ORAL_TABLET | Freq: Every day | ORAL | 1 refills | Status: DC

## 2018-04-03 NOTE — Addendum Note (Signed)
Addended by: Alena Bills R on: 04/03/2018 02:26 PM   Modules accepted: Orders

## 2018-04-09 ENCOUNTER — Ambulatory Visit: Payer: BLUE CROSS/BLUE SHIELD | Admitting: Family Medicine

## 2018-04-09 ENCOUNTER — Encounter: Payer: Self-pay | Admitting: Family Medicine

## 2018-04-09 ENCOUNTER — Encounter: Payer: Self-pay | Admitting: Physician Assistant

## 2018-04-09 VITALS — BP 115/71 | HR 72 | Ht 66.0 in | Wt 366.0 lb

## 2018-04-09 DIAGNOSIS — M25561 Pain in right knee: Secondary | ICD-10-CM

## 2018-04-09 NOTE — Patient Instructions (Signed)
Thank you for coming in today. Call or go to the ER if you develop a large red swollen joint with extreme pain or oozing puss.  Let me know if the pain does not improve or returns.  Recheck with me as needed.  Work on quad strength can help.

## 2018-04-09 NOTE — Progress Notes (Signed)
Kristy Sharp is a 53 y.o. female who presents to [place] today for right knee pain.  Patient was seen on January 28 for right knee pain.  She was treated conservatively initially with quad strengthening exercises and diclofenac gel.  She notes that her pain is not improved and she would like to proceed with steroid injection if possible.   She has been working on quad strengthening exercises and is currently doing straight leg raises.  She has worked her self up to about 10 repetitions per set.    ROS:  As above  Exam:  BP 115/71   Pulse 72   Ht 5\' 6"  (1.676 m)   Wt (!) 366 lb (166 kg)   BMI 59.07 kg/m  Wt Readings from Last 5 Encounters:  04/09/18 (!) 366 lb (166 kg)  03/20/18 (!) 369 lb (167.4 kg)  03/12/18 (!) 367 lb (166.5 kg)  02/28/18 (!) 369 lb (167.4 kg)  02/01/18 (!) 373 lb 12.8 oz (169.6 kg)   General: Well Developed, well nourished, and in no acute distress.  Neuro/Psych: Alert and oriented x3, extra-ocular muscles intact, able to move all 4 extremities, sensation grossly intact. Skin: Warm and dry, no rashes noted.  Respiratory: Not using accessory muscles, speaking in full sentences, trachea midline.  Cardiovascular: Pulses palpable, no extremity edema. Abdomen: Does not appear distended. MSK: Right knee no large effusion. Range of motion 0-100 Tender to palpation medial joint line.     Lab and Radiology Results   Procedure: Real-time Ultrasound Guided Injection of right knee Device: GE Logiq E   Images permanently stored and available for review in the ultrasound unit. Verbal informed consent obtained.  Discussed risks and benefits of procedure. Warned about infection bleeding damage to structures skin hypopigmentation and fat atrophy among others. Patient expresses understanding and agreement Time-out conducted.   Noted no overlying erythema, induration, or other signs of local infection.   Skin prepped  in a sterile fashion.   Local anesthesia: Topical Ethyl chloride.   With sterile technique and under real time ultrasound guidance:  80 mg of Kenalog and 4 mL of Marcaine injected easily.   Completed without difficulty   Pain immediately resolved suggesting accurate placement of the medication.   Advised to call if fevers/chills, erythema, induration, drainage, or persistent bleeding.   Images permanently stored and available for review in the ultrasound unit.  Impression: Technically successful ultrasound guided injection.    EXAM: RIGHT KNEE - COMPLETE 4+ VIEW  COMPARISON: 04/05/2013  FINDINGS: No evidence of fracture, dislocation, or joint effusion. There has been mild progression in tricompartmental degenerative spurring since previous study, without significant joint space narrowing. No other bone lesions identified. Soft tissues are unremarkable.  IMPRESSION: 1. No acute findings. 2. Interval progression of mild tricompartmental osteoarthritis.   Electronically Signed By: Earle Gell M.D. On: 03/01/2018 08:45 I personally (independently) visualized and performed the interpretation of the images attached in this note.      Assessment and Plan: 53 y.o. female with  Right knee pain.  Concern for exacerbation of DJD versus meniscus injury. Injection today provided immediate benefit.  I am hopeful that with injection and quad strengthening exercises she will have continued improvement.  If he fails to improve or worsens return for recheck. Weight loss will help.  PDMP not reviewed this encounter. No orders of the defined types were placed in this encounter.  No orders of the defined types were placed in this encounter.  Historical information moved to improve visibility of documentation.  Past Medical History:  Diagnosis Date  . Allergic rhinitis due to other allergen   . Allergy   . Anemia   . Anxiety   . Asthma   . B12 deficiency 04/27/2016  .  Bursitis, trochanteric   . Cellulitis   . Depression   . Headache   . Hypercholesteremia   . Hyperglycemia   . Hypothyroidism due to Hashimoto's thyroiditis 07/04/2017  . Insomnia   . Iron deficiency anemia 04/27/2016  . Morbid obesity ([place]) 07/04/2017  . Obesity   . Right lumbar radiculopathy 05/02/2013  . Sciatica   . Sinusitis   . Sleep apnea   . Vitamin B deficiency   . Vitamin D deficiency    Past Surgical History:  Procedure Laterality Date  . CHOLECYSTECTOMY N/A 04/27/2012   Procedure: LAPAROSCOPIC CHOLECYSTECTOMY;  Surgeon: Donato Heinz, MD;  Location: [facility];  Service: General;  Laterality: N/A;  . CHOLECYSTECTOMY    . EYE SURGERY    . GASTRIC BYPASS    . ORTHOPEDIC SURGERY    . REFRACTIVE SURGERY    . WRIST SURGERY Left    Social History   Tobacco Use  . Smoking status: Never Smoker  . Smokeless tobacco: Never Used  Substance Use Topics  . Alcohol use: Yes    Alcohol/week: 1.0 standard drinks    Types: 1 Cans of beer per week    Comment: occas.   family history includes Cancer - Other in her sister; Colon cancer in her maternal grandmother; Gout in her mother; Heart Problems in her mother; Lung cancer in her father; Lupus in her mother.  Medications: Current Outpatient Medications  Medication Sig Dispense Refill  . albuterol (PROVENTIL HFA;VENTOLIN HFA) 108 (90 BASE) MCG/ACT inhaler Inhale 2 puffs into the lungs every 6 (six) hours as needed for wheezing.    . cetirizine (ZYRTEC) 10 MG tablet Take 1 tablet (10 mg total) by mouth at bedtime. 90 tablet 3  . Cholecalciferol (VITAMIN D PO) Take 1 capsule by mouth at bedtime.    . citalopram (CELEXA) 40 MG tablet Take 1 tablet (40 mg total) by mouth daily. 90 tablet 1  . cyanocobalamin (,VITAMIN B-12,) 1000 MCG/ML injection Inject 1 mL (1,000 mcg total) into the muscle every 14 (fourteen) days. 1 mL 5  . diclofenac sodium (VOLTAREN) 1 % GEL Apply 4 g topically 4 (four) times daily. To affected joint. 100 g 11  .  FOLIC ACID PO Take 1 tablet by mouth daily.    . furosemide (LASIX) 20 MG tablet Take 1 tablet (20 mg total) by mouth daily as needed. 30 tablet 3  . ibuprofen (ADVIL,MOTRIN) 200 MG tablet Take by mouth every 6 (six) hours as needed for pain.    Marland Kitchen levothyroxine (SYNTHROID, LEVOTHROID) 75 MCG tablet Take 1 tablet (75 mcg total) by mouth daily before breakfast. 90 tablet 1  . LORazepam (ATIVAN) 1 MG tablet Take 1 tablet (1 mg total) by mouth daily as needed for anxiety. 90 tablet 0  . meloxicam (MOBIC) 15 MG tablet Take 15 mg by mouth at bedtime.    . montelukast (SINGULAIR) 10 MG tablet Take 1 tablet (10 mg total) by mouth at bedtime. 90 tablet 3  . Multiple Vitamins-Minerals (BARIATRIC FUSION) CHEW 1 chew PO daily 90 tablet 3  . NONFORMULARY OR COMPOUNDED ITEM Bilateral knee-high compression leg wraps. Apply to lower extremities daily 2 each 0  . omeprazole (PRILOSEC) 20 MG capsule Take  1 capsule (20 mg total) by mouth daily as needed. 90 capsule 1  . RESTASIS 0.05 % ophthalmic emulsion     . traZODone (DESYREL) 50 MG tablet Take 1 tablet (50 mg total) by mouth at bedtime as needed for sleep. 90 tablet 0  . triamcinolone cream (KENALOG) 0.1 % Apply topically as needed.     . Vitamin A 10000 units TABS Take 0.5 tablets (5,000 Units total) by mouth daily. 30 each 0   No current facility-administered medications for this visit.    No Known Allergies    Discussed warning signs or symptoms. Please see discharge instructions. Patient expresses understanding.

## 2018-04-12 DIAGNOSIS — M545 Low back pain: Secondary | ICD-10-CM | POA: Diagnosis not present

## 2018-04-12 DIAGNOSIS — G44219 Episodic tension-type headache, not intractable: Secondary | ICD-10-CM | POA: Diagnosis not present

## 2018-04-12 DIAGNOSIS — M9901 Segmental and somatic dysfunction of cervical region: Secondary | ICD-10-CM | POA: Diagnosis not present

## 2018-04-12 DIAGNOSIS — M50322 Other cervical disc degeneration at C5-C6 level: Secondary | ICD-10-CM | POA: Diagnosis not present

## 2018-04-18 DIAGNOSIS — M545 Low back pain: Secondary | ICD-10-CM | POA: Diagnosis not present

## 2018-04-18 DIAGNOSIS — G44219 Episodic tension-type headache, not intractable: Secondary | ICD-10-CM | POA: Diagnosis not present

## 2018-04-18 DIAGNOSIS — M9901 Segmental and somatic dysfunction of cervical region: Secondary | ICD-10-CM | POA: Diagnosis not present

## 2018-04-18 DIAGNOSIS — M50322 Other cervical disc degeneration at C5-C6 level: Secondary | ICD-10-CM | POA: Diagnosis not present

## 2018-04-19 DIAGNOSIS — M9901 Segmental and somatic dysfunction of cervical region: Secondary | ICD-10-CM | POA: Diagnosis not present

## 2018-04-19 DIAGNOSIS — M545 Low back pain: Secondary | ICD-10-CM | POA: Diagnosis not present

## 2018-04-19 DIAGNOSIS — M50322 Other cervical disc degeneration at C5-C6 level: Secondary | ICD-10-CM | POA: Diagnosis not present

## 2018-04-19 DIAGNOSIS — G44219 Episodic tension-type headache, not intractable: Secondary | ICD-10-CM | POA: Diagnosis not present

## 2018-04-25 DIAGNOSIS — M545 Low back pain: Secondary | ICD-10-CM | POA: Diagnosis not present

## 2018-04-25 DIAGNOSIS — G44219 Episodic tension-type headache, not intractable: Secondary | ICD-10-CM | POA: Diagnosis not present

## 2018-04-25 DIAGNOSIS — M9901 Segmental and somatic dysfunction of cervical region: Secondary | ICD-10-CM | POA: Diagnosis not present

## 2018-04-25 DIAGNOSIS — M50322 Other cervical disc degeneration at C5-C6 level: Secondary | ICD-10-CM | POA: Diagnosis not present

## 2018-04-26 DIAGNOSIS — G44219 Episodic tension-type headache, not intractable: Secondary | ICD-10-CM | POA: Diagnosis not present

## 2018-04-26 DIAGNOSIS — M50322 Other cervical disc degeneration at C5-C6 level: Secondary | ICD-10-CM | POA: Diagnosis not present

## 2018-04-26 DIAGNOSIS — M545 Low back pain: Secondary | ICD-10-CM | POA: Diagnosis not present

## 2018-04-26 DIAGNOSIS — M9901 Segmental and somatic dysfunction of cervical region: Secondary | ICD-10-CM | POA: Diagnosis not present

## 2018-05-01 DIAGNOSIS — G44219 Episodic tension-type headache, not intractable: Secondary | ICD-10-CM | POA: Diagnosis not present

## 2018-05-01 DIAGNOSIS — M50322 Other cervical disc degeneration at C5-C6 level: Secondary | ICD-10-CM | POA: Diagnosis not present

## 2018-05-01 DIAGNOSIS — M9901 Segmental and somatic dysfunction of cervical region: Secondary | ICD-10-CM | POA: Diagnosis not present

## 2018-05-01 DIAGNOSIS — M545 Low back pain: Secondary | ICD-10-CM | POA: Diagnosis not present

## 2018-05-02 DIAGNOSIS — G44219 Episodic tension-type headache, not intractable: Secondary | ICD-10-CM | POA: Diagnosis not present

## 2018-05-02 DIAGNOSIS — M9901 Segmental and somatic dysfunction of cervical region: Secondary | ICD-10-CM | POA: Diagnosis not present

## 2018-05-02 DIAGNOSIS — M545 Low back pain: Secondary | ICD-10-CM | POA: Diagnosis not present

## 2018-05-02 DIAGNOSIS — M50322 Other cervical disc degeneration at C5-C6 level: Secondary | ICD-10-CM | POA: Diagnosis not present

## 2018-05-09 DIAGNOSIS — E538 Deficiency of other specified B group vitamins: Secondary | ICD-10-CM | POA: Diagnosis not present

## 2018-05-09 DIAGNOSIS — Z9884 Bariatric surgery status: Secondary | ICD-10-CM | POA: Diagnosis not present

## 2018-05-09 DIAGNOSIS — D509 Iron deficiency anemia, unspecified: Secondary | ICD-10-CM | POA: Diagnosis not present

## 2018-05-29 ENCOUNTER — Other Ambulatory Visit: Payer: Self-pay | Admitting: Physician Assistant

## 2018-05-29 DIAGNOSIS — F5104 Psychophysiologic insomnia: Secondary | ICD-10-CM

## 2018-05-29 NOTE — Telephone Encounter (Signed)
Pt already scheduled for 4/21. Sending MyChart message to advise of Rx's being sent and fill date.

## 2018-05-29 NOTE — Telephone Encounter (Signed)
Refills sent to [facility] She will not be able to fill Lorazepam until 07/04/18, but it is on file Please schedule an e-visit follow-up for this month

## 2018-06-12 ENCOUNTER — Ambulatory Visit (INDEPENDENT_AMBULATORY_CARE_PROVIDER_SITE_OTHER): Payer: BLUE CROSS/BLUE SHIELD | Admitting: Physician Assistant

## 2018-06-12 ENCOUNTER — Encounter: Payer: Self-pay | Admitting: Physician Assistant

## 2018-06-12 VITALS — Temp 97.6°F

## 2018-06-12 DIAGNOSIS — E038 Other specified hypothyroidism: Secondary | ICD-10-CM | POA: Diagnosis not present

## 2018-06-12 DIAGNOSIS — Z5181 Encounter for therapeutic drug level monitoring: Secondary | ICD-10-CM

## 2018-06-12 DIAGNOSIS — E509 Vitamin A deficiency, unspecified: Secondary | ICD-10-CM

## 2018-06-12 DIAGNOSIS — Z86010 Personal history of colonic polyps: Secondary | ICD-10-CM

## 2018-06-12 DIAGNOSIS — Z1211 Encounter for screening for malignant neoplasm of colon: Secondary | ICD-10-CM

## 2018-06-12 DIAGNOSIS — Z1231 Encounter for screening mammogram for malignant neoplasm of breast: Secondary | ICD-10-CM

## 2018-06-12 DIAGNOSIS — E063 Autoimmune thyroiditis: Secondary | ICD-10-CM

## 2018-06-12 DIAGNOSIS — F4329 Adjustment disorder with other symptoms: Secondary | ICD-10-CM

## 2018-06-12 MED ORDER — LEVOTHYROXINE SODIUM 75 MCG PO TABS: 75.0000 ug | ORAL_TABLET | Freq: Every day | ORAL | 0 refills | Status: DC

## 2018-06-12 NOTE — Progress Notes (Signed)
Virtual Visit via Video Note  I connected with Kristy Sharp on 06/12/18 at  8:10 AM EDT by a video enabled telemedicine application and verified that I am speaking with the correct person using two identifiers.   I discussed the limitations of evaluation and management by telemedicine and the availability of in person appointments. The patient expressed understanding and agreed to proceed.  History of Present Illness: HPI:                                                                Kristy Sharp is a 53 y.o. female   Depression:  Mood - "okay," She was recently laid off due to COVID-19 on March 31. Taking Celexa 40 mg daily. Sleep - "fine" taking Trazodone 50 mg nightly, rare nighttime awakenings, feels rested  Hypothyroidism: diagnosed with Hashimotos in 2015. Takes synthroid daily without issues. Dose was increased to 75 mcg approx 3 months ago. Due for recheck TSH. She thinks it Denies chest pain, heart palpitations, tremor, GI symptoms, or skin changes.  Vitamin A deficiency: taking Bariatric Fusion daily  Past Medical History:  Diagnosis Date  . Allergic rhinitis due to other allergen   . Allergy   . Anemia   . Anxiety   . Asthma   . B12 deficiency 04/27/2016  . Bursitis, trochanteric   . Cellulitis   . Depression   . Headache   . Hypercholesteremia   . Hyperglycemia   . Hypothyroidism due to Hashimoto's thyroiditis 07/04/2017  . Insomnia   . Iron deficiency anemia 04/27/2016  . Morbid obesity ([place]) 07/04/2017  . Obesity   . Right lumbar radiculopathy 05/02/2013  . Sciatica   . Sinusitis   . Sleep apnea   . Vitamin B deficiency   . Vitamin D deficiency    Past Surgical History:  Procedure Laterality Date  . CHOLECYSTECTOMY N/A 04/27/2012   Procedure: LAPAROSCOPIC CHOLECYSTECTOMY;  Surgeon: Donato Heinz, MD;  Location: [facility];  Service: General;  Laterality: N/A;  . CHOLECYSTECTOMY    . EYE SURGERY    . GASTRIC BYPASS    . ORTHOPEDIC SURGERY    . REFRACTIVE SURGERY     . WRIST SURGERY Left    Social History   Tobacco Use  . Smoking status: Never Smoker  . Smokeless tobacco: Never Used  Substance Use Topics  . Alcohol use: Yes    Alcohol/week: 1.0 standard drinks    Types: 1 Cans of beer per week    Comment: occas.   family history includes Cancer - Other in her sister; Colon cancer in her maternal grandmother; Gout in her mother; Heart Problems in her mother; Lung cancer in her father; Lupus in her mother.    ROS: negative except as noted in the HPI  Medications: Current Outpatient Medications  Medication Sig Dispense Refill  . albuterol (PROVENTIL HFA;VENTOLIN HFA) 108 (90 BASE) MCG/ACT inhaler Inhale 2 puffs into the lungs every 6 (six) hours as needed for wheezing.    . cetirizine (ZYRTEC) 10 MG tablet Take 1 tablet (10 mg total) by mouth at bedtime. 90 tablet 3  . Cholecalciferol (VITAMIN D PO) Take 1 capsule by mouth at bedtime.    . citalopram (CELEXA) 40 MG tablet Take 1 tablet (40 mg total) by mouth  daily. 90 tablet 1  . FOLIC ACID PO Take 1 tablet by mouth daily.    . furosemide (LASIX) 20 MG tablet Take 1 tablet (20 mg total) by mouth daily as needed. 30 tablet 3  . ibuprofen (ADVIL,MOTRIN) 200 MG tablet Take by mouth every 6 (six) hours as needed for pain.    Marland Kitchen levothyroxine (SYNTHROID) 75 MCG tablet Take 1 tablet (75 mcg total) by mouth daily before breakfast. 90 tablet 0  . [START ON 07/04/2018] LORazepam (ATIVAN) 1 MG tablet Take 1 tablet (1 mg total) by mouth daily as needed for anxiety. 90 tablet 0  . meloxicam (MOBIC) 15 MG tablet Take 15 mg by mouth at bedtime.    . montelukast (SINGULAIR) 10 MG tablet Take 1 tablet (10 mg total) by mouth at bedtime. 90 tablet 3  . Multiple Vitamins-Minerals (BARIATRIC FUSION) CHEW 1 chew PO daily 90 tablet 3  . NONFORMULARY OR COMPOUNDED ITEM Bilateral knee-high compression leg wraps. Apply to lower extremities daily 2 each 0  . omeprazole (PRILOSEC) 20 MG capsule Take 1 capsule (20 mg  total) by mouth daily as needed. 90 capsule 1  . RESTASIS 0.05 % ophthalmic emulsion     . traZODone (DESYREL) 50 MG tablet Take 1 tablet (50 mg total) by mouth at bedtime as needed for sleep. 90 tablet 0  . Vitamin A 10000 units TABS Take 0.5 tablets (5,000 Units total) by mouth daily. 30 each 0  . triamcinolone cream (KENALOG) 0.1 % Apply topically as needed.      No current facility-administered medications for this visit.    No Known Allergies     Objective:  Temp 97.6 F (36.4 C) (Oral)  Gen:  alert, not ill-appearing, no distress, appropriate for age 58: head normocephalic without obvious abnormality, conjunctiva and cornea clear, trachea midline Pulm: Normal work of breathing, normal phonation, speaking in full sentences Neuro: alert and oriented x 3 Psych: well-groomed, cooperative, good eye contact, mood "okay," affect mood-congruent, speech is articulate, and thought processes clear and goal-directed, normal judgment, good insight, no SI  Lab Results  Component Value Date   CREATININE 0.62 03/12/2018   BUN 15 03/12/2018   NA 141 03/12/2018   K 4.1 03/12/2018   CL 110 03/12/2018   CO2 24 03/12/2018   Lab Results  Component Value Date   ALT 8 03/12/2018   AST 16 03/12/2018   ALKPHOS 49 04/11/2017   BILITOT 0.4 03/12/2018   No results found for: HGBA1C Lab Results  Component Value Date   TSH 3.47 03/12/2018   Lab Results  Component Value Date   PTH 82 (H) 03/12/2018   CALCIUM 8.8 03/12/2018   CALCIUM 8.8 03/12/2018   Lab Results  Component Value Date   WBC 6.0 07/07/2017   HGB 12.5 07/07/2017   HCT 38.6 07/07/2017   MCV 101.3 (H) 07/07/2017   PLT 216 07/07/2017   Lab Results  Component Value Date   IRON 61 07/07/2017   TIBC 232 (L) 07/07/2017   FERRITIN 138 07/07/2017     No results found for this or any previous visit (from the past 72 hour(s)). No results found.    Assessment and Plan: 53 y.o. female with   .Diagnoses and all orders  for this visit:  Hypothyroidism due to Hashimoto's thyroiditis -     levothyroxine (SYNTHROID) 75 MCG tablet; Take 1 tablet (75 mcg total) by mouth daily before breakfast. -     TSH  Stress and adjustment reaction  Encounter for medication monitoring -     TSH  History of colon polyps -     Ambulatory referral to Gastroenterology  Colon cancer screening -     Ambulatory referral to Gastroenterology  Breast cancer screening by mammogram -     MM 3D SCREEN BREAST BILATERAL; Future  Vitamin A deficiency -     Vitamin A   Follow-up in 6 months for med mgmt    Follow Up Instructions:    I discussed the assessment and treatment plan with the patient. The patient was provided an opportunity to ask questions and all were answered. The patient agreed with the plan and demonstrated an understanding of the instructions.   The patient was advised to call back or seek an in-person evaluation if the symptoms worsen or if the condition fails to improve as anticipated.  I provided approx 10 minutes of non-face-to-face time during this encounter.   Trixie Dredge, [place]

## 2018-06-19 DIAGNOSIS — E038 Other specified hypothyroidism: Secondary | ICD-10-CM | POA: Diagnosis not present

## 2018-06-19 DIAGNOSIS — Z5181 Encounter for therapeutic drug level monitoring: Secondary | ICD-10-CM | POA: Diagnosis not present

## 2018-06-19 DIAGNOSIS — E509 Vitamin A deficiency, unspecified: Secondary | ICD-10-CM | POA: Diagnosis not present

## 2018-06-19 DIAGNOSIS — E063 Autoimmune thyroiditis: Secondary | ICD-10-CM | POA: Diagnosis not present

## 2018-06-20 LAB — TSH: TSH: 3.2 mIU/L

## 2018-06-23 LAB — VITAMIN A: Vitamin A (Retinoic Acid): 43 ug/dL (ref 38–98)

## 2018-07-09 ENCOUNTER — Encounter: Payer: Self-pay | Admitting: Physician Assistant

## 2018-07-09 MED ORDER — LORAZEPAM 1 MG PO TABS: 1.0000 mg | ORAL_TABLET | Freq: Every day | ORAL | 0 refills | Status: DC | PRN

## 2018-08-24 ENCOUNTER — Other Ambulatory Visit: Payer: Self-pay | Admitting: Physician Assistant

## 2018-08-24 DIAGNOSIS — F419 Anxiety disorder, unspecified: Secondary | ICD-10-CM

## 2018-09-01 ENCOUNTER — Other Ambulatory Visit: Payer: Self-pay | Admitting: Physician Assistant

## 2018-09-01 DIAGNOSIS — E038 Other specified hypothyroidism: Secondary | ICD-10-CM

## 2018-09-17 ENCOUNTER — Encounter: Payer: Self-pay | Admitting: Physician Assistant

## 2018-09-17 DIAGNOSIS — M25561 Pain in right knee: Secondary | ICD-10-CM

## 2018-09-17 DIAGNOSIS — F419 Anxiety disorder, unspecified: Secondary | ICD-10-CM

## 2018-09-17 DIAGNOSIS — M170 Bilateral primary osteoarthritis of knee: Secondary | ICD-10-CM

## 2018-09-17 DIAGNOSIS — F5104 Psychophysiologic insomnia: Secondary | ICD-10-CM

## 2018-09-17 DIAGNOSIS — Z79899 Other long term (current) drug therapy: Secondary | ICD-10-CM

## 2018-09-17 DIAGNOSIS — J3089 Other allergic rhinitis: Secondary | ICD-10-CM

## 2018-09-17 DIAGNOSIS — E038 Other specified hypothyroidism: Secondary | ICD-10-CM

## 2018-09-18 MED ORDER — MONTELUKAST SODIUM 10 MG PO TABS: 10.0000 mg | ORAL_TABLET | Freq: Every day | ORAL | 1 refills | Status: DC

## 2018-09-18 MED ORDER — CITALOPRAM HYDROBROMIDE 40 MG PO TABS: 40.0000 mg | ORAL_TABLET | Freq: Every day | ORAL | 0 refills | Status: DC

## 2018-09-18 MED ORDER — LORAZEPAM 1 MG PO TABS: 1.0000 mg | ORAL_TABLET | Freq: Every day | ORAL | 0 refills | Status: DC | PRN

## 2018-09-18 MED ORDER — TRAZODONE HCL 50 MG PO TABS: 50.0000 mg | ORAL_TABLET | Freq: Every evening | ORAL | 0 refills | Status: DC | PRN

## 2018-09-18 MED ORDER — LEVOTHYROXINE SODIUM 75 MCG PO TABS: 75.0000 ug | ORAL_TABLET | Freq: Every day | ORAL | 1 refills | Status: DC

## 2018-09-18 NOTE — Telephone Encounter (Signed)
Levothyroxine was sent 09/03/18 for 90 day supply already  Citalopram was sent 08/27/18 for 90 day supply   Singular was sent 03/14/18 for 90 day with 3 RF    Trazodone and Lorazepam both pended for 90 day supply. Please advise on these refills. Pt last seen  06/12/18

## 2018-09-20 MED ORDER — MELOXICAM 7.5 MG PO TABS: 7.5000 mg | ORAL_TABLET | Freq: Every day | ORAL | 2 refills | Status: DC | PRN

## 2018-09-20 NOTE — Addendum Note (Signed)
Addended by: Darlyne Russian on: 09/20/2018 05:28 PM   Modules accepted: Orders

## 2018-10-08 ENCOUNTER — Other Ambulatory Visit: Payer: Self-pay | Admitting: Physician Assistant

## 2018-10-08 DIAGNOSIS — F419 Anxiety disorder, unspecified: Secondary | ICD-10-CM

## 2018-10-08 DIAGNOSIS — Z79899 Other long term (current) drug therapy: Secondary | ICD-10-CM

## 2018-10-09 MED ORDER — LORAZEPAM 1 MG PO TABS: 1.0000 mg | ORAL_TABLET | Freq: Every day | ORAL | 0 refills | Status: DC | PRN

## 2018-12-24 ENCOUNTER — Encounter: Payer: Self-pay | Admitting: Physician Assistant

## 2018-12-24 ENCOUNTER — Other Ambulatory Visit: Payer: Self-pay | Admitting: Physician Assistant

## 2018-12-24 DIAGNOSIS — Z79899 Other long term (current) drug therapy: Secondary | ICD-10-CM

## 2018-12-24 DIAGNOSIS — F419 Anxiety disorder, unspecified: Secondary | ICD-10-CM

## 2018-12-25 ENCOUNTER — Encounter: Payer: Self-pay | Admitting: Osteopathic Medicine

## 2018-12-25 ENCOUNTER — Other Ambulatory Visit: Payer: Self-pay | Admitting: Physician Assistant

## 2018-12-25 DIAGNOSIS — Z79899 Other long term (current) drug therapy: Secondary | ICD-10-CM

## 2018-12-25 DIAGNOSIS — F419 Anxiety disorder, unspecified: Secondary | ICD-10-CM

## 2018-12-25 DIAGNOSIS — F5104 Psychophysiologic insomnia: Secondary | ICD-10-CM

## 2018-12-25 NOTE — Telephone Encounter (Signed)
Patient has an appointment. Would like medication sent over.

## 2019-01-02 ENCOUNTER — Encounter: Payer: Self-pay | Admitting: Osteopathic Medicine

## 2019-01-02 ENCOUNTER — Other Ambulatory Visit: Payer: Self-pay

## 2019-01-02 ENCOUNTER — Ambulatory Visit (INDEPENDENT_AMBULATORY_CARE_PROVIDER_SITE_OTHER): Payer: BC Managed Care – PPO | Admitting: Osteopathic Medicine

## 2019-01-02 VITALS — BP 106/72 | HR 74 | Temp 98.3°F | Wt 388.1 lb

## 2019-01-02 DIAGNOSIS — E063 Autoimmune thyroiditis: Secondary | ICD-10-CM

## 2019-01-02 DIAGNOSIS — E038 Other specified hypothyroidism: Secondary | ICD-10-CM | POA: Diagnosis not present

## 2019-01-02 DIAGNOSIS — Z1211 Encounter for screening for malignant neoplasm of colon: Secondary | ICD-10-CM

## 2019-01-02 DIAGNOSIS — I872 Venous insufficiency (chronic) (peripheral): Secondary | ICD-10-CM | POA: Diagnosis not present

## 2019-01-02 DIAGNOSIS — E538 Deficiency of other specified B group vitamins: Secondary | ICD-10-CM

## 2019-01-02 DIAGNOSIS — M170 Bilateral primary osteoarthritis of knee: Secondary | ICD-10-CM

## 2019-01-02 DIAGNOSIS — F419 Anxiety disorder, unspecified: Secondary | ICD-10-CM | POA: Diagnosis not present

## 2019-01-02 DIAGNOSIS — F5104 Psychophysiologic insomnia: Secondary | ICD-10-CM

## 2019-01-02 DIAGNOSIS — Z9884 Bariatric surgery status: Secondary | ICD-10-CM

## 2019-01-02 DIAGNOSIS — Z23 Encounter for immunization: Secondary | ICD-10-CM

## 2019-01-02 DIAGNOSIS — Z1231 Encounter for screening mammogram for malignant neoplasm of breast: Secondary | ICD-10-CM

## 2019-01-02 DIAGNOSIS — K219 Gastro-esophageal reflux disease without esophagitis: Secondary | ICD-10-CM

## 2019-01-02 DIAGNOSIS — Z79899 Other long term (current) drug therapy: Secondary | ICD-10-CM

## 2019-01-02 DIAGNOSIS — J3089 Other allergic rhinitis: Secondary | ICD-10-CM

## 2019-01-02 MED ORDER — ESTRADIOL-LEVONORGESTREL 0.045-0.015 MG/DAY TD PTWK: 1.0000 | MEDICATED_PATCH | TRANSDERMAL | 3 refills | Status: DC

## 2019-01-02 MED ORDER — OMEPRAZOLE 20 MG PO CPDR: 20.0000 mg | DELAYED_RELEASE_CAPSULE | Freq: Every day | ORAL | 1 refills | Status: DC | PRN

## 2019-01-02 MED ORDER — MELOXICAM 15 MG PO TABS: 15.0000 mg | ORAL_TABLET | Freq: Every day | ORAL | 3 refills | Status: DC | PRN

## 2019-01-02 NOTE — Progress Notes (Signed)
HPI: Kristy Sharp is a 53 y.o. female who  has a past medical history of Allergic rhinitis due to other allergen, Allergy, Anemia, Anxiety, Asthma, B12 deficiency (04/27/2016), Bursitis, trochanteric, Cellulitis, Depression, Headache, Hypercholesteremia, Hyperglycemia, Hypothyroidism due to Hashimoto's thyroiditis (07/04/2017), Insomnia, Iron deficiency anemia (04/27/2016), Morbid obesity ([place]) (07/04/2017), Obesity, Right lumbar radiculopathy (05/02/2013), Sciatica, Sinusitis, Sleep apnea, Vitamin B deficiency, and Vitamin D deficiency.  she presents to [facility], 01/02/19,  for chief complaint of:  Establish w/ new provider  Anxiety Hypothyroid  Anxiety/Insomnia Celexa 40 mg and Ativan 1 mg daily  Trazodone 50 mg qhs  Hot flashes/perimenopause: Periods are starting to become irregular Patient inquires about estrogen patches, her sister is on these and has had good luck with them  Hypothyroid Synthroid 75 mcg, last TSH WNL   Asthma/Allergies: Albuterol, Zyrtec, Singuliar  MSK/arthritis: Knee pain is greatest problem Mobic was reduced to 7.5 mg, patient would like to go back up on this  GERD/Hx bariatric surgery: Omeprazole Vitamins: Multi, D, A,   Edema: Lasix 20 mg daily prn, compression hose  No recent issues with this.    At today's visit 01/02/19 ... PMH, PSH, FH reviewed and updated as needed.  Current medication list and allergy/intolerance hx reviewed and updated as needed. (See remainder of HPI, ROS, Phys Exam below)   No results found.  No results found for this or any previous visit (from the past 72 hour(s)).        ASSESSMENT/PLAN: The primary encounter diagnosis was Bilateral primary osteoarthritis of knee. Diagnoses of Chronic anxiety, Edema of both lower extremities due to peripheral venous insufficiency, Hypothyroidism due to Hashimoto's thyroiditis, Chronic prescription benzodiazepine use, Perennial allergic  rhinitis, H/O gastric bypass, B12 deficiency, Gastroesophageal reflux disease, Chronic insomnia, Encounter for screening mammogram for malignant neoplasm of breast, Need for shingles vaccine, and Need for influenza vaccination were also pertinent to this visit.    Orders Placed This Encounter  Procedures  . MM 3D SCREEN BREAST BILATERAL  . Varicella-zoster vaccine IM (Shingrix)  . Flu Vaccine QUAD 6+ mos PF IM (Fluarix Quad PF)     Meds ordered this encounter  Medications  . meloxicam (MOBIC) 15 MG tablet    Sig: Take 1 tablet (15 mg total) by mouth daily as needed for pain.    Dispense:  90 tablet    Refill:  3  . omeprazole (PRILOSEC) 20 MG capsule    Sig: Take 1 capsule (20 mg total) by mouth daily as needed.    Dispense:  90 capsule    Refill:  1  . estradiol-levonorgestrel (CLIMARAPRO) 0.045-0.015 MG/DAY    Sig: Place 1 patch onto the skin once a week.    Dispense:  12 patch    Refill:  3    Patient Instructions  Will send estrogen/prgesterone Will refer to GI - please call/message w/ the name of the Dr Newton Pigg sent Mobic up to 15 mg and take Omeprazole daily if you're going to be on the Mobic daily         Follow-up plan: Return in about 6 months (around 07/02/2019) for ANNUAL/PAP (get labs prior to visit, orders are in).                                                 ################################################# ################################################# ################################################# #################################################  Current Meds  Medication Sig  . albuterol (PROVENTIL HFA;VENTOLIN HFA) 108 (90 BASE) MCG/ACT inhaler Inhale 2 puffs into the lungs every 6 (six) hours as needed for wheezing.  . cetirizine (ZYRTEC) 10 MG tablet Take 1 tablet (10 mg total) by mouth at bedtime.  . Cholecalciferol (VITAMIN D PO) Take 1 capsule by mouth at bedtime.  . citalopram  (CELEXA) 40 MG tablet Take 1 tablet (40 mg total) by mouth daily.  Marland Kitchen FOLIC ACID PO Take 1 tablet by mouth daily.  . furosemide (LASIX) 20 MG tablet Take 1 tablet (20 mg total) by mouth daily as needed.  Marland Kitchen ibuprofen (ADVIL,MOTRIN) 200 MG tablet Take by mouth every 6 (six) hours as needed for pain.  Marland Kitchen levothyroxine (SYNTHROID) 75 MCG tablet Take 1 tablet (75 mcg total) by mouth daily before breakfast.  . LORazepam (ATIVAN) 1 MG tablet TAKE 1 TABLET BY MOUTH DAILY AS NEEDED FOR ANXIETY. GENERIC EQUIVALENT FOR ATIVAN  . montelukast (SINGULAIR) 10 MG tablet Take 1 tablet (10 mg total) by mouth at bedtime.  . Multiple Vitamins-Minerals (BARIATRIC FUSION) CHEW 1 chew PO daily  . NONFORMULARY OR COMPOUNDED ITEM Bilateral knee-high compression leg wraps. Apply to lower extremities daily  . RESTASIS 0.05 % ophthalmic emulsion   . traZODone (DESYREL) 50 MG tablet TAKE 1 TABLET BY MOUTH AT BEDTIME AS NEEDED FOR SLEEP. GENERIC EQUIVALENT FOR DESYREL  . triamcinolone cream (KENALOG) 0.1 % Apply topically as needed.   . Vitamin A 10000 units TABS Take 0.5 tablets (5,000 Units total) by mouth daily.  . [DISCONTINUED] meloxicam (MOBIC) 7.5 MG tablet Take 1-2 tablets (7.5-15 mg total) by mouth daily as needed for pain.  . [DISCONTINUED] omeprazole (PRILOSEC) 20 MG capsule Take 1 capsule (20 mg total) by mouth daily as needed.    No Known Allergies     Review of Systems:  Constitutional: No recent illness  HEENT: No  headache, no vision change  Cardiac: No  chest pain, No  pressure, No palpitations  Respiratory:  No  shortness of breath. No  Cough  Gastrointestinal: No  abdominal pain  Musculoskeletal: +myalgia/arthralgia  Skin: No  Rash  Hem/Onc: No  easy bruising/bleeding, No  abnormal lumps/bumps  Neurologic: No  weakness, No  Dizziness  Psychiatric: No  concerns with depression, No  concerns with anxiety  Exam:  BP 106/72 (BP Location: Left Arm, Patient Position: Sitting, Cuff Size:  Large)   Pulse 74   Temp 98.3 F (36.8 C) (Oral)   Wt (!) 388 lb 1.9 oz (176.1 kg)   BMI 62.64 kg/m   Constitutional: VS see above. General Appearance: alert, well-developed, well-nourished, NAD  Eyes: Normal lids and conjunctive, non-icteric sclera  Ears, Nose, Mouth, Throat: MMM, Normal external inspection ears/nares/mouth/lips/gums.  Neck: No masses, trachea midline.   Respiratory: Normal respiratory effort. no wheeze, no rhonchi, no rales  Cardiovascular: S1/S2 normal, no murmur, no rub/gallop auscultated. RRR.   Musculoskeletal: Gait normal. Symmetric and independent movement of all extremities  Abdominal: non-tender, non-distended, no appreciable organomegaly, neg Murphy's, BS WNLx4  Neurological: Normal balance/coordination. No tremor.  Skin: warm, dry, intact.   Psychiatric: Normal judgment/insight. Normal mood and affect. Oriented x3.       Visit summary with medication list and pertinent instructions was printed for patient to review, patient was advised to alert [place] if any updates are needed. All questions at time of visit were answered - patient instructed to contact office with any additional concerns. ER/RTC precautions were reviewed with the patient and  understanding verbalized.   Note: Total time spent 25 minutes, greater than 50% of the visit was spent face-to-face counseling and coordinating care for the following: The primary encounter diagnosis was Encounter for screening mammogram for malignant neoplasm of breast. Diagnoses of Chronic anxiety, Edema of both lower extremities due to peripheral venous insufficiency, Hypothyroidism due to Hashimoto's thyroiditis, Chronic prescription benzodiazepine use, Bilateral primary osteoarthritis of knee, Perennial allergic rhinitis, H/O gastric bypass, B12 deficiency, Gastroesophageal reflux disease, Chronic insomnia, Need for shingles vaccine, and Need for influenza vaccination were also pertinent to this visit.Marland Kitchen  Please  note: voice recognition software was used to produce this document, and typos may escape review. Please contact Dr. Sheppard Coil for any needed clarifications.    Follow up plan: Return in about 6 months (around 07/02/2019) for ANNUAL/PAP (get labs prior to visit, orders are in).

## 2019-01-02 NOTE — Patient Instructions (Addendum)
Will send estrogen/prgesterone Will refer to GI - please call/message w/ the name of the Dr Newton Pigg sent Mobic up to 15 mg and take Omeprazole daily if you're going to be on the Mobic daily

## 2019-01-07 DIAGNOSIS — K219 Gastro-esophageal reflux disease without esophagitis: Secondary | ICD-10-CM | POA: Diagnosis not present

## 2019-01-07 DIAGNOSIS — Z86010 Personal history of colonic polyps: Secondary | ICD-10-CM | POA: Diagnosis not present

## 2019-01-07 DIAGNOSIS — K5900 Constipation, unspecified: Secondary | ICD-10-CM | POA: Diagnosis not present

## 2019-02-16 ENCOUNTER — Other Ambulatory Visit: Payer: Self-pay | Admitting: Physician Assistant

## 2019-02-16 DIAGNOSIS — F419 Anxiety disorder, unspecified: Secondary | ICD-10-CM

## 2019-02-18 MED ORDER — CITALOPRAM HYDROBROMIDE 40 MG PO TABS: 40.0000 mg | ORAL_TABLET | Freq: Every day | ORAL | 1 refills | Status: DC

## 2019-02-21 ENCOUNTER — Ambulatory Visit: Payer: BC Managed Care – PPO

## 2019-03-16 ENCOUNTER — Encounter: Payer: Self-pay | Admitting: Osteopathic Medicine

## 2019-03-26 ENCOUNTER — Encounter: Payer: Self-pay | Admitting: Osteopathic Medicine

## 2019-03-26 DIAGNOSIS — F5104 Psychophysiologic insomnia: Secondary | ICD-10-CM

## 2019-03-26 DIAGNOSIS — M170 Bilateral primary osteoarthritis of knee: Secondary | ICD-10-CM

## 2019-03-26 DIAGNOSIS — F419 Anxiety disorder, unspecified: Secondary | ICD-10-CM

## 2019-03-26 DIAGNOSIS — Z79899 Other long term (current) drug therapy: Secondary | ICD-10-CM

## 2019-03-27 MED ORDER — TRAZODONE HCL 50 MG PO TABS: 50.0000 mg | ORAL_TABLET | Freq: Every evening | ORAL | 3 refills | Status: DC | PRN

## 2019-03-27 MED ORDER — LORAZEPAM 1 MG PO TABS: 1.0000 mg | ORAL_TABLET | Freq: Every day | ORAL | 0 refills | Status: DC | PRN

## 2019-03-27 MED ORDER — MELOXICAM 15 MG PO TABS: 15.0000 mg | ORAL_TABLET | Freq: Every day | ORAL | 3 refills | Status: DC | PRN

## 2019-05-13 ENCOUNTER — Encounter: Payer: Self-pay | Admitting: Medical-Surgical

## 2019-06-13 ENCOUNTER — Encounter: Payer: Self-pay | Admitting: Osteopathic Medicine

## 2019-06-13 DIAGNOSIS — J3089 Other allergic rhinitis: Secondary | ICD-10-CM

## 2019-06-13 DIAGNOSIS — Z79899 Other long term (current) drug therapy: Secondary | ICD-10-CM

## 2019-06-13 DIAGNOSIS — F419 Anxiety disorder, unspecified: Secondary | ICD-10-CM

## 2019-06-13 DIAGNOSIS — E038 Other specified hypothyroidism: Secondary | ICD-10-CM

## 2019-06-13 NOTE — Telephone Encounter (Signed)
Routing to provider. Pharmacy has been updated. Forwarding to provider to approve controlled rx. Rxs pended.

## 2019-06-14 MED ORDER — LEVOTHYROXINE SODIUM 75 MCG PO TABS: 75.0000 ug | ORAL_TABLET | Freq: Every day | ORAL | 1 refills | Status: DC

## 2019-06-14 MED ORDER — MONTELUKAST SODIUM 10 MG PO TABS: 10.0000 mg | ORAL_TABLET | Freq: Every day | ORAL | 1 refills | Status: DC

## 2019-06-14 MED ORDER — LORAZEPAM 1 MG PO TABS: 1.0000 mg | ORAL_TABLET | Freq: Every day | ORAL | 0 refills | Status: DC | PRN

## 2019-06-25 ENCOUNTER — Other Ambulatory Visit: Payer: Self-pay | Admitting: Osteopathic Medicine

## 2019-06-25 DIAGNOSIS — F419 Anxiety disorder, unspecified: Secondary | ICD-10-CM

## 2019-06-25 DIAGNOSIS — Z79899 Other long term (current) drug therapy: Secondary | ICD-10-CM

## 2019-07-03 ENCOUNTER — Telehealth: Payer: Self-pay | Admitting: Hematology and Oncology

## 2019-07-03 NOTE — Telephone Encounter (Signed)
Received a new hem referral from Dr. Nancy Fetter for Kristy Sharp. Kristy Sharp has been seen previously at [facility] but wanted to transfer her care to [facility]. Kristy Sharp has been scheduled to see Dr. Alvy Bimler on 5/17 at 1:20pm. She aware to arrive 15 minutes early.

## 2019-07-08 ENCOUNTER — Inpatient Hospital Stay: Payer: 59

## 2019-07-08 ENCOUNTER — Inpatient Hospital Stay: Payer: 59 | Attending: Hematology and Oncology | Admitting: Hematology and Oncology

## 2019-07-08 ENCOUNTER — Other Ambulatory Visit: Payer: Self-pay

## 2019-07-08 ENCOUNTER — Encounter: Payer: Self-pay | Admitting: Hematology and Oncology

## 2019-07-08 VITALS — BP 130/68 | HR 70 | Temp 98.0°F | Resp 18 | Ht 66.0 in | Wt >= 6400 oz

## 2019-07-08 DIAGNOSIS — Z79899 Other long term (current) drug therapy: Secondary | ICD-10-CM | POA: Diagnosis not present

## 2019-07-08 DIAGNOSIS — K909 Intestinal malabsorption, unspecified: Secondary | ICD-10-CM | POA: Insufficient documentation

## 2019-07-08 DIAGNOSIS — E063 Autoimmune thyroiditis: Secondary | ICD-10-CM | POA: Insufficient documentation

## 2019-07-08 DIAGNOSIS — Z809 Family history of malignant neoplasm, unspecified: Secondary | ICD-10-CM

## 2019-07-08 DIAGNOSIS — E610 Copper deficiency: Secondary | ICD-10-CM | POA: Diagnosis not present

## 2019-07-08 DIAGNOSIS — Z801 Family history of malignant neoplasm of trachea, bronchus and lung: Secondary | ICD-10-CM | POA: Insufficient documentation

## 2019-07-08 DIAGNOSIS — Z8249 Family history of ischemic heart disease and other diseases of the circulatory system: Secondary | ICD-10-CM | POA: Diagnosis not present

## 2019-07-08 DIAGNOSIS — Z791 Long term (current) use of non-steroidal anti-inflammatories (NSAID): Secondary | ICD-10-CM | POA: Diagnosis not present

## 2019-07-08 DIAGNOSIS — D508 Other iron deficiency anemias: Secondary | ICD-10-CM

## 2019-07-08 DIAGNOSIS — G629 Polyneuropathy, unspecified: Secondary | ICD-10-CM | POA: Diagnosis not present

## 2019-07-08 DIAGNOSIS — Z9884 Bariatric surgery status: Secondary | ICD-10-CM | POA: Diagnosis not present

## 2019-07-08 DIAGNOSIS — E538 Deficiency of other specified B group vitamins: Secondary | ICD-10-CM

## 2019-07-08 DIAGNOSIS — Z800 Family history of malignant neoplasm of digestive organs: Secondary | ICD-10-CM | POA: Diagnosis not present

## 2019-07-08 DIAGNOSIS — E559 Vitamin D deficiency, unspecified: Secondary | ICD-10-CM | POA: Diagnosis not present

## 2019-07-08 LAB — CBC WITH DIFFERENTIAL/PLATELET
Abs Immature Granulocytes: 0.01 10*3/uL (ref 0.00–0.07)
Basophils Absolute: 0 10*3/uL (ref 0.0–0.1)
Basophils Relative: 1 %
Eosinophils Absolute: 0.2 10*3/uL (ref 0.0–0.5)
Eosinophils Relative: 3 %
HCT: 42 % (ref 36.0–46.0)
Hemoglobin: 13.3 g/dL (ref 12.0–15.0)
Immature Granulocytes: 0 %
Lymphocytes Relative: 31 %
Lymphs Abs: 1.7 10*3/uL (ref 0.7–4.0)
MCH: 32 pg (ref 26.0–34.0)
MCHC: 31.7 g/dL (ref 30.0–36.0)
MCV: 101.2 fL — ABNORMAL HIGH (ref 80.0–100.0)
Monocytes Absolute: 0.5 10*3/uL (ref 0.1–1.0)
Monocytes Relative: 10 %
Neutro Abs: 3 10*3/uL (ref 1.7–7.7)
Neutrophils Relative %: 55 %
Platelets: 207 10*3/uL (ref 150–400)
RBC: 4.15 MIL/uL (ref 3.87–5.11)
RDW: 13.9 % (ref 11.5–15.5)
WBC: 5.4 10*3/uL (ref 4.0–10.5)
nRBC: 0 % (ref 0.0–0.2)

## 2019-07-08 LAB — IRON AND TIBC
Iron: 105 ug/dL (ref 41–142)
Saturation Ratios: 36 % (ref 21–57)
TIBC: 291 ug/dL (ref 236–444)
UIBC: 186 ug/dL (ref 120–384)

## 2019-07-08 LAB — VITAMIN B12: Vitamin B-12: 196 pg/mL (ref 180–914)

## 2019-07-08 LAB — FERRITIN: Ferritin: 51 ng/mL (ref 11–307)

## 2019-07-08 LAB — VITAMIN D 25 HYDROXY (VIT D DEFICIENCY, FRACTURES): Vit D, 25-Hydroxy: 27.71 ng/mL — ABNORMAL LOW (ref 30–100)

## 2019-07-08 NOTE — Assessment & Plan Note (Signed)
She has very mild peripheral neuropathy There could be component of possible trace copper deficiency I will check a copper level and will call her with test results

## 2019-07-08 NOTE — Progress Notes (Signed)
[name] NOTE  Patient Care Team: Denny Levy, [place] as PCP - General (Family Medicine) Janetta Hora, MD as Consulting Physician (Hematology) Moody Bruins, MD as Consulting Physician (Ophthalmology)  CHIEF COMPLAINTS/PURPOSE OF CONSULTATION:  Multiple mineral deficiencies secondary to malabsorption, status post Roux-en-Y gastric bypass surgery  HISTORY OF PRESENTING ILLNESS:  Kristy Sharp 54 y.o. 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 She had not noticed any recent bleeding such as epistaxis, hematuria or hematochezia The patient has used NSAID for chronic joint pain. She is not on antiplatelets agents.  She denies any pica and eats a variety of diet. She never donated blood or received blood transfusion She used to take vitamin B12 injections but have stopped because her last B12 level was high but that was over a year ago  MEDICAL HISTORY:  Past Medical History:  Diagnosis Date  . Allergic rhinitis due to other allergen   . Allergy   . Anemia   . Anxiety   . Asthma   . B12 deficiency 04/27/2016  . Bursitis, trochanteric   . Cellulitis   .  Depression   . Headache   . Hypercholesteremia   . Hyperglycemia   . Hypothyroidism due to Hashimoto's thyroiditis 07/04/2017  . Insomnia   . Iron deficiency anemia 04/27/2016  . Morbid obesity ([place]) 07/04/2017  . Obesity   . Right lumbar radiculopathy 05/02/2013  . Sciatica   . Sinusitis   . Sleep apnea   . Vitamin B deficiency   . Vitamin D deficiency     SURGICAL HISTORY: Past Surgical History:  Procedure Laterality Date  . CHOLECYSTECTOMY N/A 04/27/2012   Procedure: LAPAROSCOPIC CHOLECYSTECTOMY;  Surgeon: Donato Heinz, MD;  Location: [facility];  Service: General;  Laterality: N/A;  . CHOLECYSTECTOMY    . EYE SURGERY    . GASTRIC BYPASS    . ORTHOPEDIC SURGERY    . REFRACTIVE SURGERY    . WRIST SURGERY Left     SOCIAL HISTORY: Social History   Socioeconomic History  . Marital status: Single    Spouse name: Not on file  . Number of children: 0  . Years of education: college  . Highest education level: Not on file  Occupational History  . Occupation: clerk  Tobacco Use  . Smoking status: Never Smoker  . Smokeless tobacco: Never Used  Substance and Sexual Activity  . Alcohol use: Yes    Alcohol/week: 1.0 standard drinks    Types: 1 Cans of beer per week    Comment: occas.  . Drug use: No  . Sexual activity: Not Currently    Birth control/protection: Abstinence  Other Topics Concern  . Not on file  Social History Narrative   Patient  is single   Patient is right-handed.   Patient works full time at a call center    Education some college   Caffeine coffee two cups daily and some times one soda            Social Determinants of Health   Financial Resource Strain:   . Difficulty of Paying Living Expenses:   Food Insecurity:   . Worried About Charity fundraiser in the Last Year:   . Arboriculturist in the Last Year:   Transportation Needs:   . Film/video editor (Medical):   Marland Kitchen Lack of Transportation (Non-Medical):   Physical Activity:   . Days of  Exercise per Week:   . Minutes of Exercise per Session:   Stress:   . Feeling of Stress :   Social Connections:   . Frequency of Communication with Friends and Family:   . Frequency of Social Gatherings with Friends and Family:   . Attends Religious Services:   . Active Member of Clubs or Organizations:   . Attends Archivist Meetings:   Marland Kitchen Marital Status:   Intimate Partner Violence:   . Fear of Current or Ex-Partner:   . Emotionally Abused:   Marland Kitchen Physically Abused:   . Sexually Abused:     FAMILY HISTORY: Family History  Problem Relation Age of Onset  . Lupus Mother   . Heart Problems Mother   . Gout Mother   . Lung cancer Father   . Colon cancer Maternal Grandmother   . Cancer - Other Sister     ALLERGIES:  has No Known Allergies.  MEDICATIONS:  Current Outpatient Medications  Medication Sig Dispense Refill  . albuterol (PROVENTIL HFA;VENTOLIN HFA) 108 (90 BASE) MCG/ACT inhaler Inhale 2 puffs into the lungs every 6 (six) hours as needed for wheezing.    . cetirizine (ZYRTEC) 10 MG tablet Take 1 tablet (10 mg total) by mouth at bedtime. 90 tablet 3  . Cholecalciferol (VITAMIN D PO) Take 1 capsule by mouth at bedtime.    . citalopram (CELEXA) 40 MG tablet Take 1 tablet (40 mg total) by mouth daily. 90 tablet 1  . FOLIC ACID PO Take 1 tablet by mouth daily.    . furosemide (LASIX) 20 MG tablet Take 1 tablet (20 mg total) by mouth daily as needed. 30 tablet 3  . ibuprofen (ADVIL,MOTRIN) 200 MG tablet Take by mouth every 6 (six) hours as needed for pain.    Marland Kitchen levothyroxine (SYNTHROID) 75 MCG tablet Take 1 tablet (75 mcg total) by mouth daily before breakfast. 90 tablet 1  . LORazepam (ATIVAN) 1 MG tablet Take 1 tablet (1 mg total) by mouth daily as needed for anxiety. 90 tablet 0  . meloxicam (MOBIC) 15 MG tablet Take 1 tablet (15 mg total) by mouth daily as needed for pain. 90 tablet 3  . montelukast (SINGULAIR) 10 MG tablet Take 1 tablet (10 mg total) by mouth at  bedtime. 90 tablet 1  . Multiple Vitamins-Minerals (BARIATRIC FUSION) CHEW 1 chew PO daily 90 tablet 3  . NONFORMULARY OR COMPOUNDED ITEM Bilateral knee-high compression leg wraps. Apply to lower extremities daily 2 each 0  . omeprazole (PRILOSEC) 20 MG capsule Take 1 capsule (20 mg total) by mouth daily as needed. 90 capsule 1  . traZODone (DESYREL) 50 MG tablet Take 1 tablet (50 mg total) by mouth at bedtime as needed for sleep. 90 tablet 3  . Vitamin A 10000 units TABS Take  0.5 tablets (5,000 Units total) by mouth daily. 30 each 0   No current facility-administered medications for this visit.    REVIEW OF SYSTEMS:   Constitutional: Denies fevers, chills or abnormal night sweats Eyes: Denies blurriness of vision, double vision or watery eyes Ears, nose, mouth, throat, and face: Denies mucositis or sore throat Respiratory: Denies cough, dyspnea or wheezes Cardiovascular: Denies palpitation, chest discomfort or lower extremity swelling Gastrointestinal:  Denies nausea, heartburn or change in bowel habits Skin: Denies abnormal skin rashes Lymphatics: Denies new lymphadenopathy or easy bruising Neurological:Denies numbness, tingling or new weaknesses Behavioral/Psych: Mood is stable, no new changes  All other systems were reviewed with the patient and are negative.  PHYSICAL EXAMINATION: ECOG PERFORMANCE STATUS: 1 - Symptomatic but completely ambulatory  Vitals:   07/08/19 1342  BP: 130/68  Pulse: 70  Resp: 18  Temp: 98 F (36.7 C)  SpO2: 96%   Filed Weights   07/08/19 1342  Weight: (!) 403 lb 4.8 oz (182.9 kg)    GENERAL:alert, no distress and comfortable.  Limited examination due to class III obesity SKIN: skin color, texture, turgor are normal, no rashes or significant lesions EYES: normal, conjunctiva are pink and non-injected, sclera clear OROPHARYNX:no exudate, no erythema and lips, buccal mucosa, and tongue normal  NECK: supple, thyroid normal size, non-tender,  without nodularity LYMPH:  no palpable lymphadenopathy in the cervical, axillary or inguinal LUNGS: clear to auscultation and percussion with normal breathing effort HEART: regular rate & rhythm and no murmurs and no lower extremity edema ABDOMEN:abdomen soft, non-tender and normal bowel sounds Musculoskeletal:no cyanosis of digits and no clubbing  PSYCH: alert & oriented x 3 with fluent speech NEURO: no focal motor/sensory deficits  ASSESSMENT & PLAN:  B12 deficiency She had history of vitamin B12 deficiency secondary to malabsorption from intestinal bypass surgery I will check vitamin B12 level and will replace accordingly  Copper deficiency She has very mild peripheral neuropathy There could be component of possible trace copper deficiency I will check a copper level and will call her with test results  Iron deficiency anemia She had history of iron deficiency secondary to intestinal malabsorption I will check iron studies I will call her with test results If she need iron replacement therapy, I will schedule accordingly  Vitamin D deficiency She has history of vitamin D deficiency secondary to malabsorption I will check a vitamin D level today  Obesity, Class III, BMI 40-49.9 (morbid obesity) ([place]) She has recurrent class III obesity after intestinal bypass surgery She had a lot of emotional component to stressful eating and difficulties establishing new and healthy eating habits She is not getting adequate sleep and she struggle with portion control and unhealthy snacks I gave her a few suggestions about what would be the best way to help her get started with healthy living I also offered to refer her to weight management clinic in the future  Orders Placed This Encounter  Procedures  . CBC with Differential/Platelet    Standing Status:   Future    Number of Occurrences:   1    Standing Expiration Date:   08/11/2020  . Iron and TIBC    Standing Status:   Future     Number of Occurrences:   1    Standing Expiration Date:   08/11/2020  . Vitamin B12    Standing Status:   Future    Number of Occurrences:   1    Standing Expiration Date:   08/11/2020  .  VITAMIN D 25 Hydroxy (Vit-D Deficiency, Fractures)    Standing Status:   Future    Number of Occurrences:   1    Standing Expiration Date:   07/07/2020  . Ferritin    Standing Status:   Future    Number of Occurrences:   1    Standing Expiration Date:   08/11/2020  . Copper, serum    Standing Status:   Future    Number of Occurrences:   1    Standing Expiration Date:   07/07/2020    All questions were answered. The patient knows to call the clinic with any problems, questions or concerns.  The total time spent in the appointment was 55 minutes encounter with patients including review of chart and various tests results, discussions about plan of care and coordination of care plan  Heath Lark, MD 5/17/20213:53 PM       Heath Lark, MD 07/08/19 3:53 PM

## 2019-07-08 NOTE — Assessment & Plan Note (Signed)
She had history of iron deficiency secondary to intestinal malabsorption I will check iron studies I will call her with test results If she need iron replacement therapy, I will schedule accordingly

## 2019-07-08 NOTE — Assessment & Plan Note (Addendum)
She has recurrent class III obesity after intestinal bypass surgery She had a lot of emotional component to stressful eating and difficulties establishing new and healthy eating habits She is not getting adequate sleep and she struggle with portion control and unhealthy snacks I gave her a few suggestions about what would be the best way to help her get started with healthy living I also offered to refer her to weight management clinic in the future

## 2019-07-08 NOTE — Assessment & Plan Note (Signed)
She had history of vitamin B12 deficiency secondary to malabsorption from intestinal bypass surgery I will check vitamin B12 level and will replace accordingly

## 2019-07-08 NOTE — Assessment & Plan Note (Signed)
She has history of vitamin D deficiency secondary to malabsorption I will check a vitamin D level today

## 2019-07-09 ENCOUNTER — Telehealth: Payer: Self-pay | Admitting: Hematology and Oncology

## 2019-07-09 ENCOUNTER — Other Ambulatory Visit: Payer: Self-pay | Admitting: Hematology and Oncology

## 2019-07-09 DIAGNOSIS — E538 Deficiency of other specified B group vitamins: Secondary | ICD-10-CM

## 2019-07-09 MED ORDER — CYANOCOBALAMIN 1000 MCG/ML IJ SOLN: 1000.0000 ug | INTRAMUSCULAR | 11 refills | Status: DC

## 2019-07-09 NOTE — Telephone Encounter (Signed)
I reviewed test results with the patient over the telephone She is found to have vitamin B12 deficiency and vitamin D deficiency I recommend vitamin B12 injection once a month and recheck in 6 months She is in agreement For vitamin D replacement therapy, I recommend she increase her vitamin D supplement to 2000 units and recheck in 6 months I will see her back in a year I have addressed all her questions and concerns

## 2019-07-10 ENCOUNTER — Telehealth: Payer: Self-pay | Admitting: *Deleted

## 2019-07-10 ENCOUNTER — Telehealth: Payer: Self-pay | Admitting: Hematology and Oncology

## 2019-07-10 LAB — COPPER, SERUM: Copper: 116 ug/dL (ref 80–158)

## 2019-07-10 NOTE — Telephone Encounter (Signed)
Scheduled per 5/18 sch message. Mailed calendar to pt.

## 2019-07-10 NOTE — Telephone Encounter (Signed)
Per Dr.Gorsuch, called to make pt aware of normal copper level. Pt also advised to pick B12 prescription up from yesterday. Pt verbalized understanding

## 2019-07-10 NOTE — Telephone Encounter (Signed)
-----   Message from Flo Shanks, RN sent at 07/10/2019  9:09 AM EDT ----- Regarding: RE: lab  ----- Message ----- From: Heath Lark, MD Sent: 07/10/2019   5:48 AM EDT To: Flo Shanks, RN Subject: lab                                            Pls call her, copper level is nromal Ask if she gets her b12 prescription from yesterday

## 2019-12-30 ENCOUNTER — Encounter: Payer: Self-pay | Admitting: Hematology and Oncology

## 2020-01-03 NOTE — Progress Notes (Addendum)
Cardiology Office Note:   Date:  01/06/2020  NAME:  Kristy Sharp    MRN: [id number] DOB:  1965-03-11   PCP:  Denny Levy, [place]  Cardiologist:  No primary care provider on file.   Referring MD: Denny Levy, PA   Chief Complaint  Patient presents with  . New Patient (Initial Visit)  . Headache  . Shortness of Breath  . Chest Pain  . Edema    Legs and feet.    History of Present Illness:   Kristy Sharp is a 54 y.o. female with a hx of morbid obesity, B12 deficiency who is being seen today for the evaluation of lower extremity edema at the request of Denny Levy, [place].  She reports for the last 6 months she has had swelling in her lower extremities.  She reports that she has been on and off torsemide and Lasix.  Symptoms can improve with this but there is no long-term relief of her symptoms.  She does have right greater than left lower extremity edema.  No venous ultrasounds have been ordered.  She is morbidly obese with a BMI of 65.  She reports she is trying to lose weight.  She also reports that she can get intermittent episodes of chest pain.  Described as sharp pains at last seconds.  Can occur anytime.  She is not been that active as of lately due to concerns of shortness of breath and chest pain.  She also gets palpitations.  Apparently these are pretty frequent for her.  She can get 2 episodes per day.  Described as skipped beats.  Can last seconds.  We did discuss sleep apnea.  She reports that she is tired and fatigued most of the day.  She has several vitamin deficiencies related to gastric bypass surgery.  She is noticed not much improvement in her fatigue with replacement of vitamins.  Her blood pressure is 116/74.  She is never had a heart attack or stroke.  She is not diabetic.  She reports that her thyroid was checked by her primary care recently and this was normal.  Her EKG demonstrates normal sinus rhythm with nonspecific ST-T changes.  She is not exercising currently.   She is a never smoker.  No Sharp family history of heart disease.  Her mother did have atrial fibrillation.  No illicit drug use is reported.  Rare alcohol use is reported.  Problem List 1. Morbid obesity  -BMI 65 2. B12 deficiency  Past Medical History: Past Medical History:  Diagnosis Date  . Allergic rhinitis due to other allergen   . Allergy   . Anemia   . Anxiety   . Asthma   . B12 deficiency 04/27/2016  . Bursitis, trochanteric   . Cellulitis   . Depression   . Headache   . Hypercholesteremia   . Hyperglycemia   . Hypothyroidism due to Hashimoto's thyroiditis 07/04/2017  . Insomnia   . Iron deficiency anemia 04/27/2016  . Morbid obesity ([place]) 07/04/2017  . Obesity   . Right lumbar radiculopathy 05/02/2013  . Sciatica   . Sinusitis   . Sleep apnea   . Vitamin B deficiency   . Vitamin D deficiency     Past Surgical History: Past Surgical History:  Procedure Laterality Date  . CHOLECYSTECTOMY N/A 04/27/2012   Procedure: LAPAROSCOPIC CHOLECYSTECTOMY;  Surgeon: Donato Heinz, MD;  Location: [facility];  Service: General;  Laterality: N/A;  . CHOLECYSTECTOMY    . EYE SURGERY    .  GASTRIC BYPASS    . ORTHOPEDIC SURGERY    . REFRACTIVE SURGERY    . WRIST SURGERY Left     Current Medications: Current Meds  Medication Sig  . albuterol (PROVENTIL HFA;VENTOLIN HFA) 108 (90 BASE) MCG/ACT inhaler Inhale 2 puffs into the lungs every 6 (six) hours as needed for wheezing.  . cetirizine (ZYRTEC) 10 MG tablet Take 1 tablet (10 mg total) by mouth at bedtime.  . Cholecalciferol (VITAMIN D PO) Take 1 capsule by mouth at bedtime.  . citalopram (CELEXA) 40 MG tablet Take 1 tablet (40 mg total) by mouth daily.  . cyanocobalamin (,VITAMIN B-12,) 1000 MCG/ML injection Inject 1 mL (1,000 mcg total) into the muscle every 30 (thirty) days.  Marland Kitchen FOLIC ACID PO Take 1 tablet by mouth daily.  . furosemide (LASIX) 20 MG tablet Take 1 tablet (20 mg total) by mouth daily as needed.  Marland Kitchen levothyroxine  (SYNTHROID) 75 MCG tablet Take 1 tablet (75 mcg total) by mouth daily before breakfast.  . LORazepam (ATIVAN) 1 MG tablet Take 1 tablet (1 mg total) by mouth daily as needed for anxiety.  . meloxicam (MOBIC) 15 MG tablet Take 1 tablet (15 mg total) by mouth daily as needed for pain.  . montelukast (SINGULAIR) 10 MG tablet Take 1 tablet (10 mg total) by mouth at bedtime.  . Multiple Vitamins-Minerals (BARIATRIC FUSION) CHEW 1 chew PO daily  . NONFORMULARY OR COMPOUNDED ITEM Bilateral knee-high compression leg wraps. Apply to lower extremities daily  . omeprazole (PRILOSEC) 20 MG capsule Take 1 capsule (20 mg total) by mouth daily as needed.  . traZODone (DESYREL) 50 MG tablet Take 1 tablet (50 mg total) by mouth at bedtime as needed for sleep.  . Vitamin A 10000 units TABS Take 0.5 tablets (5,000 Units total) by mouth daily.     Allergies:    Patient has no known allergies.   Social History: Social History   Socioeconomic History  . Marital status: Single    Spouse name: Not on file  . Number of children: 0  . Years of education: college  . Highest education level: Not on file  Occupational History  . 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 Smoking status: Never Smoker  . Smokeless tobacco: Never Used  Vaping Use  . Vaping Use: Never used  Substance and Sexual Activity  . Alcohol use: Yes    Alcohol/week: 1.0 standard drink    Types: 1 Cans of beer per week    Comment: occas.  . Drug use: No  . Sexual activity: Not Currently    Birth control/protection: Abstinence  Other Topics Concern  . Not on file  Social History Narrative   Patient is single   Patient is right-handed.   Patient works full time at a call center    Education some college   Caffeine coffee two cups daily and some times one soda            Social Determinants of Health   Financial Resource Strain:   . Difficulty of Paying Living Expenses: Not on file  Food Insecurity:   . Worried About Sales executive in the Last Year: Not on file  . Ran Out of Food in the Last Year: Not on file  Transportation Needs:   . Lack of Transportation (Medical): Not on file  . Lack of Transportation (Non-Medical): Not on file  Physical Activity:   . Days of Exercise per Week: Not on file  .  Minutes of Exercise per Session: Not on file  Stress:   . Feeling of Stress : Not on file  Social Connections:   . Frequency of Communication with Friends and Family: Not on file  . Frequency of Social Gatherings with Friends and Family: Not on file  . Attends Religious Services: Not on file  . Active Member of Clubs or Organizations: Not on file  . Attends Archivist Meetings: Not on file  . Marital Status: Not on file     Family History: The patient's family history includes Arrhythmia in her mother; Cancer - Other in her sister; Colon cancer in her maternal grandmother; Gout in her mother; Heart Problems in her mother; Lung cancer in her father; Lupus in her mother.  ROS:   All other ROS reviewed and negative. Pertinent positives noted in the HPI.     EKGs/Labs/Other Studies Reviewed:   The following studies were personally reviewed by me today:  EKG:  EKG is ordered today.  The ekg ordered today demonstrates normal sinus rhythm, heart rate 70, nonspecific ST-T changes, no evidence of prior infarction, and was personally reviewed by me.   Recent Labs: 07/08/2019: Hemoglobin 13.3; Platelets 207   Recent Lipid Panel No results found for: CHOL, TRIG, HDL, CHOLHDL, VLDL, LDLCALC, LDLDIRECT  Physical Exam:   VS:  BP 116/74 (BP Location: Left Arm, Patient Position: Sitting, Cuff Size: Large)   Pulse 70   Ht 5\' 6"  (1.676 m)   Wt (!) 406 lb (184.2 kg)   BMI 65.53 kg/m    Wt Readings from Last 3 Encounters:  01/06/20 (!) 406 lb (184.2 kg)  07/08/19 (!) 403 lb 4.8 oz (182.9 kg)  01/02/19 (!) 388 lb 1.9 oz (176.1 kg)    General: Well nourished, well developed, in no acute distress Heart:  Atraumatic, normal size  Eyes: PEERLA, EOMI  Neck: Supple, no JVD Endocrine: No thryomegaly Cardiac: Normal S1, S2; RRR; no murmurs, rubs, or gallops Lungs: Clear to auscultation bilaterally, no wheezing, rhonchi or rales  Abd: Soft, nontender, no hepatomegaly  Ext: Venous insufficiency changes noted in the lower extremities, right greater than left, the right lower extremity is more swollen with some mild erythema, negative Homans' sign Musculoskeletal: No deformities, BUE and BLE strength normal and equal Skin: Warm and dry, no rashes   Neuro: Alert and oriented to person, place, time, and situation, CNII-XII grossly intact, no focal deficits  Psych: Normal mood and affect   ASSESSMENT:   Kristy Sharp is a 54 y.o. female who presents for the following: 1. Leg edema   2. SOB (shortness of breath)   3. Palpitations   4. Obesity, morbid, BMI 50 or higher ([place])   5. Venous insufficiency   6. Snoring   7. Fatigue, unspecified type     PLAN:   1. SOB (shortness of breath) -I suspect her symptoms of shortness of breath are related to deconditioning.  She does have lower extremity edema but this is related to venous insufficiency.  I suspect she also has the beginnings of lymphedema.  I recommended compression stockings as well as leg elevation.  She will continue her Lasix as needed.  She has no evidence of heart failure on examination.  Lungs are clear.  EKG shows nonspecific ST-T changes which is related to obesity.  We will obtain an echocardiogram just to make sure there is no significant LV dysfunction.  2. Palpitations -Describes palpitations.  Occur several times per day.  She is on  Synthroid supplementation.  Reports TSH was within normal limits on recent PCP visit.  I recommended a 3-day Zio patch to exclude arrhythmia.  3. Leg edema -Venous insufficiency changes noted.  Right greater than left.  We need to obtain venous reflux studies of the lower extremities.  We will also be  able to see if there is a DVT on the study.  There is no tachycardia or hypoxia to suggest PE.  4. Obesity, morbid, BMI 50 or higher (HCC) -Weight loss encouraged.  5. Venous insufficiency -See above discussion of lower extremity edema.  6. Snoring 7. Fatigue, unspecified type -She reports she is tired and fatigue despite vitamin supplementation.  I highly suspect she has sleep apnea.  She is okay to proceed with a home sleep study.   Disposition: Return in about 3 months (around 04/07/2020).  Medication Adjustments/Labs and Tests Ordered: Current medicines are reviewed at length with the patient today.  Concerns regarding medicines are outlined above.  Orders Placed This Encounter  Procedures  . LONG TERM MONITOR (3-14 DAYS)  . EKG 12-Lead  . ECHOCARDIOGRAM COMPLETE  . Home sleep test  . VAS [place] LOWER EXTREMITY VENOUS (DVT)  . VAS [place] LOWER EXTREMITY VENOUS REFLUX   No orders of the defined types were placed in this encounter.   Patient Instructions  Medication Instructions:  NO CHANGES *If you need a refill on your cardiac medications before your next appointment, please call your pharmacy*   Lab Work: NONE If you have labs (blood work) drawn today and your tests are completely normal, you will receive your results only by: Marland Kitchen MyChart Message (if you have MyChart) OR . A paper copy in the mail If you have any lab test that is abnormal or we need to change your treatment, we will call you to review the results.   Testing/Procedures: Your physician has requested that you have an echocardiogram. Echocardiography is a painless test that uses sound waves to create images of your heart. It provides your doctor with information about the size and shape of your heart and how well your heart's chambers and valves are working. This procedure takes approximately one hour. There are no restrictions for this procedure.  ZIO XT- Long Term Monitor Instructions   Your physician has  requested you wear your ZIO patch monitor___3____days.   This is a single patch monitor.  [employer] supplies one patch monitor per enrollment.  Additional stickers are not available.   Please do not apply patch if you will be having a Nuclear Stress Test, Echocardiogram, Cardiac CT, MRI, or Chest Xray during the time frame you would be wearing the monitor. The patch cannot be worn during these tests.  You cannot remove and re-apply the ZIO XT patch monitor.   Your ZIO patch monitor will be sent USPS Priority mail from [facility] directly to your home address. The monitor may also be mailed to a PO BOX if home delivery is not available.   It may take 3-5 days to receive your monitor after you have been enrolled.   Once you have received you monitor, please review enclosed instructions.  Your monitor has already been registered assigning a specific monitor serial # to you.   Applying the monitor   Shave hair from upper left chest.   Hold abrader disc by orange tab.  Rub abrader in 40 strokes over left upper chest as indicated in your monitor instructions.   Clean area with 4 enclosed alcohol pads .  Use all pads to assure are is cleaned thoroughly.  Let dry.   Apply patch as indicated in monitor instructions.  Patch will be place under collarbone on left side of chest with arrow pointing upward.   Rub patch adhesive wings for 2 minutes.Remove white label marked "1".  Remove white label marked "2".  Rub patch adhesive wings for 2 additional minutes.   While looking in a mirror, press and release button in center of patch.  A small green light will flash 3-4 times .  This will be your only indicator the monitor has been turned on.     Do not shower for the first 24 hours.  You may shower after the first 24 hours.   Press button if you feel a symptom. You will hear a small click.  Record Date, Time and Symptom in the Patient Log Book.   When you are ready to remove patch, follow  instructions on last 2 pages of Patient Log Book.  Stick patch monitor onto last page of Patient Log Book.   Place Patient Log Book in [place] box.  Use locking tab on box and tape box closed securely.  The [employer] and [employer] has [employer] on it.  Please place in mailbox as soon as possible.  Your physician should have your test results approximately 7 days after the monitor has been mailed back to [facility].   Call [place] at [phone] if you have questions regarding your ZIO XT patch monitor.  Call them immediately if you see an orange light blinking on your monitor.   If your monitor falls off in less than 4 days contact our Monitor department at [phone].  If your monitor becomes loose or falls off after 4 days call [employer] at [phone] for suggestions on securing your monitor.  Your physician has recommended that you have a sleep study. This test records several body functions during sleep, including: brain activity, eye movement, oxygen and carbon dioxide blood levels, heart rate and rhythm, breathing rate and rhythm, the flow of air through your mouth and nose, snoring, body muscle movements, and chest and belly movement.  Your physician has requested that you have a lower  extremity venous duplex. This test is an ultrasound of the veins in the legs . It looks at venous blood flow that carries blood from the heart to the legs . Allow one hour for a Lower Venous exam. Allow thirty minutes for an Upper Venous exam. There are no restrictions or special instructions.  Your physician has recommended that you have a sleep study. This test records several body functions during sleep, including: brain activity, eye movement, oxygen and carbon dioxide blood levels, heart rate and rhythm, breathing rate and rhythm, the flow of air through your mouth and nose, snoring, body muscle movements, and chest and belly movement.  Your physician has requested that you have  a lower  extremity venous duplex. This test is an ultrasound of the veins in the legs It looks at venous blood flow that carries blood from the heart to the legs . Allow one hour for a Lower Venous exam.  There are no restrictions or special instructions.     Follow-Up: At [facility], you and your health needs are our priority.  As part of our continuing mission to provide you with exceptional heart care, we have created designated Provider Care Teams.  These Care Teams include your primary Cardiologist (physician) and Advanced Practice Providers (APPs -  Physician Assistants and Nurse Practitioners) who all work together to provide you with the care you need, when you need it.  We recommend signing up for the patient portal called "MyChart".  Sign up information is provided on this After Visit Summary.  MyChart is used to connect with patients for Virtual Visits (Telemedicine).  Patients are able to view lab/test results, encounter notes, upcoming appointments, etc.  Non-urgent messages can be sent to your provider as well.   To learn more about what you can do with MyChart, go to [email or web address].    Your next appointment:   3 month(s)  The format for your next appointment:   In Person  Provider:   Eleonore Chiquito, MD   Other Instructions NONE     Signed, Addison Naegeli. Audie Box, [place]  [address] [place], [place] [address] [phone]  01/06/2020 10:09 AM

## 2020-01-06 ENCOUNTER — Encounter: Payer: Self-pay | Admitting: *Deleted

## 2020-01-06 ENCOUNTER — Encounter: Payer: Self-pay | Admitting: Cardiovascular Disease

## 2020-01-06 ENCOUNTER — Other Ambulatory Visit: Payer: Self-pay

## 2020-01-06 ENCOUNTER — Ambulatory Visit (INDEPENDENT_AMBULATORY_CARE_PROVIDER_SITE_OTHER): Payer: 59 | Admitting: Cardiovascular Disease

## 2020-01-06 VITALS — BP 116/74 | HR 70 | Ht 66.0 in | Wt >= 6400 oz

## 2020-01-06 DIAGNOSIS — R0602 Shortness of breath: Secondary | ICD-10-CM | POA: Diagnosis not present

## 2020-01-06 DIAGNOSIS — R002 Palpitations: Secondary | ICD-10-CM

## 2020-01-06 DIAGNOSIS — I872 Venous insufficiency (chronic) (peripheral): Secondary | ICD-10-CM

## 2020-01-06 DIAGNOSIS — R0683 Snoring: Secondary | ICD-10-CM

## 2020-01-06 DIAGNOSIS — R600 Localized edema: Secondary | ICD-10-CM | POA: Diagnosis not present

## 2020-01-06 DIAGNOSIS — R5383 Other fatigue: Secondary | ICD-10-CM

## 2020-01-06 NOTE — Progress Notes (Signed)
Patient ID: Kristy Sharp, female   DOB: 1965-09-16, 54 y.o.   MRN: [id number] Patient enrolled for [employer] to ship a 3 day ZIO XT long term holter monitor to her home.

## 2020-01-06 NOTE — Addendum Note (Signed)
Addended by: Geralynn Rile on: 01/06/2020 10:09 AM   Modules accepted: Orders

## 2020-01-06 NOTE — Patient Instructions (Addendum)
Medication Instructions:  NO CHANGES *If you need a refill on your cardiac medications before your next appointment, please call your pharmacy*   Lab Work: NONE If you have labs (blood work) drawn today and your tests are completely normal, you will receive your results only by: Marland Kitchen MyChart Message (if you have MyChart) OR . A paper copy in the mail If you have any lab test that is abnormal or we need to change your treatment, we will call you to review the results.   Testing/Procedures: Your physician has requested that you have an echocardiogram. Echocardiography is a painless test that uses sound waves to create images of your heart. It provides your doctor with information about the size and shape of your heart and how well your heart's chambers and valves are working. This procedure takes approximately one hour. There are no restrictions for this procedure.  ZIO XT- Long Term Monitor Instructions   Your physician has requested you wear your ZIO patch monitor___3____days.   This is a single patch monitor.  Ir[employer] supplies one patch monitor per enrollment.  Additional stickers are not available.   Please do not apply patch if you will be having a Nuclear Stress Test, Echocardiogram, Cardiac CT, MRI, or Chest Xray during the time frame you would be wearing the monitor. The patch cannot be worn during these tests.  You cannot remove and re-apply the ZIO XT patch monitor.   Your ZIO patch monitor will be sent USPS Priority mail from [facility] directly to your home address. The monitor may also be mailed to a PO BOX if home delivery is not available.   It may take 3-5 days to receive your monitor after you have been enrolled.   Once you have received you monitor, please review enclosed instructions.  Your monitor has already been registered assigning a specific monitor serial # to you.   Applying the monitor   Shave hair from upper left chest.   Hold abrader disc by orange  tab.  Rub abrader in 40 strokes over left upper chest as indicated in your monitor instructions.   Clean area with 4 enclosed alcohol pads .  Use all pads to assure are is cleaned thoroughly.  Let dry.   Apply patch as indicated in monitor instructions.  Patch will be place under collarbone on left side of chest with arrow pointing upward.   Rub patch adhesive wings for 2 minutes.Remove white label marked "1".  Remove white label marked "2".  Rub patch adhesive wings for 2 additional minutes.   While looking in a mirror, press and release button in center of patch.  A small green light will flash 3-4 times .  This will be your only indicator the monitor has been turned on.     Do not shower for the first 24 hours.  You may shower after the first 24 hours.   Press button if you feel a symptom. You will hear a small click.  Record Date, Time and Symptom in the Patient Log Book.   When you are ready to remove patch, follow instructions on last 2 pages of Patient Log Book.  Stick patch monitor onto last page of Patient Log Book.   Place Patient Log Book in [place] box.  Use locking tab on box and tape box closed securely.  The [employer] and [employer] has [employer] on it.  Please place in mailbox as soon as possible.  Your physician should have your test results  approximately 7 days after the monitor has been mailed back to [place].   Call [place] at [phone] if you have questions regarding your ZIO XT patch monitor.  Call them immediately if you see an orange light blinking on your monitor.   If your monitor falls off in less than 4 days contact our Monitor department at [phone].  If your monitor becomes loose or falls off after 4 days call [employer] at [phone] for suggestions on securing your monitor.  Your physician has recommended that you have a sleep study. This test records several body functions during sleep, including: brain activity, eye movement,  oxygen and carbon dioxide blood levels, heart rate and rhythm, breathing rate and rhythm, the flow of air through your mouth and nose, snoring, body muscle movements, and chest and belly movement.  Your physician has requested that you have a lower  extremity venous duplex. This test is an ultrasound of the veins in the legs . It looks at venous blood flow that carries blood from the heart to the legs . Allow one hour for a Lower Venous exam. Allow thirty minutes for an Upper Venous exam. There are no restrictions or special instructions.  Your physician has recommended that you have a sleep study. This test records several body functions during sleep, including: brain activity, eye movement, oxygen and carbon dioxide blood levels, heart rate and rhythm, breathing rate and rhythm, the flow of air through your mouth and nose, snoring, body muscle movements, and chest and belly movement.  Your physician has requested that you have a lower  extremity venous duplex. This test is an ultrasound of the veins in the legs It looks at venous blood flow that carries blood from the heart to the legs . Allow one hour for a Lower Venous exam.  There are no restrictions or special instructions.     Follow-Up: At [facility], you and your health needs are our priority.  As part of our continuing mission to provide you with exceptional heart care, we have created designated Provider Care Teams.  These Care Teams include your primary Cardiologist (physician) and Advanced Practice Providers (APPs -  Physician Assistants and Nurse Practitioners) who all work together to provide you with the care you need, when you need it.  We recommend signing up for the patient portal called "MyChart".  Sign up information is provided on this After Visit Summary.  MyChart is used to connect with patients for Virtual Visits (Telemedicine).  Patients are able to view lab/test results, encounter notes, upcoming appointments, etc.   Non-urgent messages can be sent to your provider as well.   To learn more about what you can do with MyChart, go to [email or web address].    Your next appointment:   3 month(s)  The format for your next appointment:   In Person  Provider:   Eleonore Chiquito, MD   Other Instructions NONE

## 2020-01-08 ENCOUNTER — Other Ambulatory Visit: Payer: Self-pay | Admitting: Hematology and Oncology

## 2020-01-08 DIAGNOSIS — D508 Other iron deficiency anemias: Secondary | ICD-10-CM

## 2020-01-08 DIAGNOSIS — Z9884 Bariatric surgery status: Secondary | ICD-10-CM

## 2020-01-08 DIAGNOSIS — E559 Vitamin D deficiency, unspecified: Secondary | ICD-10-CM

## 2020-01-08 DIAGNOSIS — E538 Deficiency of other specified B group vitamins: Secondary | ICD-10-CM

## 2020-01-09 ENCOUNTER — Other Ambulatory Visit: Payer: 59

## 2020-01-09 ENCOUNTER — Inpatient Hospital Stay: Payer: 59 | Attending: Hematology and Oncology

## 2020-01-09 ENCOUNTER — Other Ambulatory Visit (INDEPENDENT_AMBULATORY_CARE_PROVIDER_SITE_OTHER): Payer: 59

## 2020-01-09 ENCOUNTER — Other Ambulatory Visit: Payer: Self-pay

## 2020-01-09 DIAGNOSIS — Z800 Family history of malignant neoplasm of digestive organs: Secondary | ICD-10-CM | POA: Insufficient documentation

## 2020-01-09 DIAGNOSIS — R0602 Shortness of breath: Secondary | ICD-10-CM | POA: Insufficient documentation

## 2020-01-09 DIAGNOSIS — E559 Vitamin D deficiency, unspecified: Secondary | ICD-10-CM

## 2020-01-09 DIAGNOSIS — Z809 Family history of malignant neoplasm, unspecified: Secondary | ICD-10-CM | POA: Diagnosis not present

## 2020-01-09 DIAGNOSIS — K909 Intestinal malabsorption, unspecified: Secondary | ICD-10-CM | POA: Diagnosis not present

## 2020-01-09 DIAGNOSIS — Z801 Family history of malignant neoplasm of trachea, bronchus and lung: Secondary | ICD-10-CM | POA: Diagnosis not present

## 2020-01-09 DIAGNOSIS — E538 Deficiency of other specified B group vitamins: Secondary | ICD-10-CM | POA: Diagnosis not present

## 2020-01-09 DIAGNOSIS — Z8249 Family history of ischemic heart disease and other diseases of the circulatory system: Secondary | ICD-10-CM | POA: Insufficient documentation

## 2020-01-09 DIAGNOSIS — D509 Iron deficiency anemia, unspecified: Secondary | ICD-10-CM | POA: Insufficient documentation

## 2020-01-09 DIAGNOSIS — Z9884 Bariatric surgery status: Secondary | ICD-10-CM | POA: Insufficient documentation

## 2020-01-09 DIAGNOSIS — R5383 Other fatigue: Secondary | ICD-10-CM | POA: Insufficient documentation

## 2020-01-09 DIAGNOSIS — Z791 Long term (current) use of non-steroidal anti-inflammatories (NSAID): Secondary | ICD-10-CM | POA: Diagnosis not present

## 2020-01-09 DIAGNOSIS — Z79899 Other long term (current) drug therapy: Secondary | ICD-10-CM | POA: Insufficient documentation

## 2020-01-09 DIAGNOSIS — R002 Palpitations: Secondary | ICD-10-CM | POA: Diagnosis not present

## 2020-01-09 DIAGNOSIS — D508 Other iron deficiency anemias: Secondary | ICD-10-CM

## 2020-01-09 LAB — CBC WITH DIFFERENTIAL/PLATELET
Abs Immature Granulocytes: 0.01 10*3/uL (ref 0.00–0.07)
Basophils Absolute: 0 10*3/uL (ref 0.0–0.1)
Basophils Relative: 1 %
Eosinophils Absolute: 0.1 10*3/uL (ref 0.0–0.5)
Eosinophils Relative: 3 %
HCT: 39.5 % (ref 36.0–46.0)
Hemoglobin: 12.6 g/dL (ref 12.0–15.0)
Immature Granulocytes: 0 %
Lymphocytes Relative: 31 %
Lymphs Abs: 1.7 10*3/uL (ref 0.7–4.0)
MCH: 31.9 pg (ref 26.0–34.0)
MCHC: 31.9 g/dL (ref 30.0–36.0)
MCV: 100 fL (ref 80.0–100.0)
Monocytes Absolute: 0.5 10*3/uL (ref 0.1–1.0)
Monocytes Relative: 9 %
Neutro Abs: 3.1 10*3/uL (ref 1.7–7.7)
Neutrophils Relative %: 56 %
Platelets: 197 10*3/uL (ref 150–400)
RBC: 3.95 MIL/uL (ref 3.87–5.11)
RDW: 14.4 % (ref 11.5–15.5)
WBC: 5.6 10*3/uL (ref 4.0–10.5)
nRBC: 0 % (ref 0.0–0.2)

## 2020-01-09 LAB — IRON AND TIBC
Iron: 101 ug/dL (ref 41–142)
Saturation Ratios: 32 % (ref 21–57)
TIBC: 319 ug/dL (ref 236–444)
UIBC: 218 ug/dL (ref 120–384)

## 2020-01-09 LAB — FERRITIN: Ferritin: 21 ng/mL (ref 11–307)

## 2020-01-09 LAB — VITAMIN B12: Vitamin B-12: 200 pg/mL (ref 180–914)

## 2020-01-09 LAB — VITAMIN D 25 HYDROXY (VIT D DEFICIENCY, FRACTURES): Vit D, 25-Hydroxy: 28.25 ng/mL — ABNORMAL LOW (ref 30–100)

## 2020-01-10 ENCOUNTER — Telehealth: Payer: Self-pay

## 2020-01-10 NOTE — Telephone Encounter (Signed)
Called and left below message. Ask her to call the office back. 

## 2020-01-10 NOTE — Telephone Encounter (Signed)
-----   Message from Heath Lark, MD sent at 01/10/2020  7:59 AM EST ----- Regarding: virtual or inperson visit to discuss test results Is she free on Tuesday at 1245 to discuss test result? E-visit or inperson

## 2020-01-13 ENCOUNTER — Telehealth: Payer: Self-pay | Admitting: Obstetrics & Gynecology

## 2020-01-13 ENCOUNTER — Telehealth: Payer: Self-pay | Admitting: Hematology and Oncology

## 2020-01-13 NOTE — Telephone Encounter (Signed)
Left message for patient to verify mychart visit for pre reg °

## 2020-01-13 NOTE — Telephone Encounter (Signed)
She sent a mychart message that she is available tomorrow at 11/23 at 1245 for virtual visit.

## 2020-01-13 NOTE — Telephone Encounter (Signed)
Scheduled appt per sch msg. Called and left msg.  °

## 2020-01-14 ENCOUNTER — Encounter: Payer: Self-pay | Admitting: Hematology and Oncology

## 2020-01-14 ENCOUNTER — Inpatient Hospital Stay (HOSPITAL_BASED_OUTPATIENT_CLINIC_OR_DEPARTMENT_OTHER): Payer: 59 | Admitting: Hematology and Oncology

## 2020-01-14 DIAGNOSIS — E538 Deficiency of other specified B group vitamins: Secondary | ICD-10-CM | POA: Diagnosis not present

## 2020-01-14 DIAGNOSIS — E559 Vitamin D deficiency, unspecified: Secondary | ICD-10-CM

## 2020-01-14 DIAGNOSIS — D508 Other iron deficiency anemias: Secondary | ICD-10-CM

## 2020-01-14 NOTE — Assessment & Plan Note (Signed)
Even though her serum vitamin B12 was within normal range, she have high MCV on his CBC I suspect she is borderline vitamin B12 deficient I recommend she resume doing B12 injection monthly I plan to recheck a B12 level again in 6 months

## 2020-01-14 NOTE — Assessment & Plan Note (Signed)
She is noted to have profound vitamin D deficiency despite taking 5000 units/day I recommend her to increase vitamin D supplement to 10,000 units/day and I plan to recheck it again in 6 months

## 2020-01-14 NOTE — Progress Notes (Signed)
HEMATOLOGY-ONCOLOGY ELECTRONIC VISIT PROGRESS NOTE  Patient Care Team: Denny Levy, [place] as PCP - General (Family Medicine) Janetta Hora, MD as Consulting Physician (Hematology) Moody Bruins, MD as Consulting Physician (Ophthalmology)  I connected with by [facility] video conference and verified that I am speaking with the correct person using two identifiers.  The visit is changed to a phone call visit only  ASSESSMENT & PLAN:  B12 deficiency Even though her serum vitamin B12 was within normal range, she have high MCV on his CBC I suspect she is borderline vitamin B12 deficient I recommend she resume doing B12 injection monthly I plan to recheck a B12 level again in 6 months  Iron deficiency anemia Even though she is no longer anemic, she has borderline iron deficiency She have symptomatic fatigue We discussed the risk and benefits of intravenous iron infusion For now, she would like to hold off pending cardiac work-up  Vitamin D deficiency She is noted to have profound vitamin D deficiency despite taking 5000 units/day I recommend her to increase vitamin D supplement to 10,000 units/day and I plan to recheck it again in 6 months   No orders of the defined types were placed in this encounter.   INTERVAL HISTORY: Please see below for problem oriented charting. She continues to have excessive fatigue since last time I saw her She has not done B12 injection for 2 months She is undergoing cardiac work-up for shortness of breath and leg swelling The patient denies any recent signs or symptoms of bleeding such as spontaneous epistaxis, hematuria or hematochezia. 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 She had not noticed any recent bleeding such as epistaxis, hematuria or hematochezia The patient has used NSAID for chronic joint pain. She is not on antiplatelets agents.  She denies any pica and eats a variety of diet. She never donated blood or received blood transfusion She used to take vitamin B12 injections but have stopped because her last B12 level was high but that was over a year ago  REVIEW OF SYSTEMS:   Constitutional: Denies fevers, chills or abnormal weight loss Eyes: Denies blurriness of vision Ears, nose, mouth, throat, and face: Denies mucositis or sore throat Gastrointestinal:  Denies nausea, heartburn or change in bowel habits Skin: Denies abnormal skin rashes Lymphatics: Denies new lymphadenopathy or easy bruising Neurological:Denies numbness, tingling or new weaknesses Behavioral/Psych: Mood is stable, no new changes  Extremities: No lower extremity edema All other systems were reviewed with the patient and are negative.  I have reviewed the past medical history, past surgical history, social history and family history with the patient and they  are unchanged from previous note.  ALLERGIES:  has No Known Allergies.  MEDICATIONS:  Current Outpatient Medications  Medication Sig Dispense Refill  . Cholecalciferol (VITAMIN D PO)  Take 2 capsules by mouth at bedtime. 10,000 units per day    . albuterol (PROVENTIL HFA;VENTOLIN HFA) 108 (90 BASE) MCG/ACT inhaler Inhale 2 puffs into the lungs every 6 (six) hours as needed for wheezing.    . cetirizine (ZYRTEC) 10 MG tablet Take 1 tablet (10 mg total) by mouth at bedtime. 90 tablet 3  . citalopram (CELEXA) 40 MG tablet Take 1 tablet (40 mg total) by mouth daily. 90 tablet 1  . cyanocobalamin (,VITAMIN B-12,) 1000 MCG/ML injection Inject 1 mL (1,000 mcg total) into the muscle every 30 (thirty) days. 1 mL 11  . FOLIC ACID PO Take 1 tablet by mouth daily.    . furosemide (LASIX) 20 MG tablet Take 1 tablet (20 mg total) by mouth daily as needed. 30 tablet 3  . levothyroxine (SYNTHROID) 75 MCG tablet Take 1 tablet (75 mcg total) by mouth daily before breakfast. 90 tablet 1  . LORazepam (ATIVAN) 1 MG tablet Take 1 tablet (1 mg total) by mouth daily as needed for anxiety. 90 tablet 0  . meloxicam (MOBIC) 15 MG tablet Take 1 tablet (15 mg total) by mouth daily as needed for pain. 90 tablet 3  . montelukast (SINGULAIR) 10 MG tablet Take 1 tablet (10 mg total) by mouth at bedtime. 90 tablet 1  . Multiple Vitamins-Minerals (BARIATRIC FUSION) CHEW 1 chew PO daily 90 tablet 3  . NONFORMULARY OR COMPOUNDED ITEM Bilateral knee-high compression leg wraps. Apply to lower extremities daily 2 each 0  . omeprazole (PRILOSEC) 20 MG capsule Take 1 capsule (20 mg total) by mouth daily as needed. 90 capsule 1  . traZODone (DESYREL) 50 MG tablet Take 1 tablet (50 mg total) by mouth at bedtime as needed for sleep. 90 tablet 3  . Vitamin A 10000 units TABS Take 0.5 tablets (5,000 Units total) by mouth daily. 30 each 0   No current facility-administered medications for this visit.    PHYSICAL EXAMINATION: ECOG PERFORMANCE STATUS: 0 - Asymptomatic  LABORATORY DATA:  I have reviewed the data as listed CMP Latest Ref Rng & Units 03/12/2018 03/12/2018 04/11/2017  Glucose 65 - 99 mg/dL - 95 100(H)  BUN 7  - 25 mg/dL - 15 15  Creatinine 0.50 - 1.05 mg/dL - 0.62 0.74  Sodium 135 - 146 mmol/L - 141 138  Potassium 3.5 - 5.3 mmol/L - 4.1 3.9  Chloride 98 - 110 mmol/L - 110 105  CO2 20 - 32 mmol/L - 24 24  Calcium 8.6 - 10.4 mg/dL 8.8 8.8 8.7(L)  Total Protein 6.1 - 8.1 g/dL - 6.0(L) 6.6  Total Bilirubin 0.2 - 1.2 mg/dL - 0.4 0.6  Alkaline Phos 38 - 126 U/L - - 49  AST 10 - 35 U/L - 16 19  ALT 6 - 29 U/L - 8 13(L)    Lab Results  Component Value Date   WBC 5.6 01/09/2020   HGB 12.6 01/09/2020   HCT 39.5 01/09/2020   MCV 100.0 01/09/2020   PLT 197 01/09/2020   NEUTROABS 3.1 01/09/2020     I discussed the assessment and treatment plan with the patient. The patient was provided an opportunity to ask questions and all were answered. The patient agreed with the plan and demonstrated an understanding of the instructions. The patient was advised to call back or seek  an in-person evaluation if the symptoms worsen or if the condition fails to improve as anticipated.    I spent 20 minutes for the appointment reviewing test results, discuss management and coordination of care.  Heath Lark, MD 01/14/2020 1:54 PM

## 2020-01-14 NOTE — Assessment & Plan Note (Signed)
Even though she is no longer anemic, she has borderline iron deficiency She have symptomatic fatigue We discussed the risk and benefits of intravenous iron infusion For now, she would like to hold off pending cardiac work-up

## 2020-01-23 ENCOUNTER — Other Ambulatory Visit: Payer: Self-pay

## 2020-01-23 ENCOUNTER — Ambulatory Visit (HOSPITAL_COMMUNITY)
Admission: RE | Admit: 2020-01-23 | Discharge: 2020-01-23 | Disposition: A | Discharge status: Home / Self Care | Payer: 59 | Source: Ambulatory Visit | Attending: Cardiovascular Disease | Admitting: Cardiovascular Disease

## 2020-01-23 DIAGNOSIS — R600 Localized edema: Secondary | ICD-10-CM

## 2020-01-28 ENCOUNTER — Other Ambulatory Visit: Payer: Self-pay | Admitting: *Deleted

## 2020-01-28 ENCOUNTER — Telehealth: Payer: Self-pay | Admitting: Cardiovascular Disease

## 2020-01-28 MED ORDER — METOPROLOL SUCCINATE ER 25 MG PO TB24: 25.0000 mg | ORAL_TABLET | Freq: Every day | ORAL | 3 refills | Status: DC

## 2020-01-28 NOTE — Telephone Encounter (Addendum)
HST scheduled 02/25/20

## 2020-01-28 NOTE — Telephone Encounter (Signed)
Call ref [phone] - no PA needed for HST.  Called the patient and told her she is scheduled for Tuesday, Jan 4 at 12:30, to expect information packet and gave her the sleep lab number.

## 2020-01-30 ENCOUNTER — Ambulatory Visit (HOSPITAL_COMMUNITY): Payer: 59 | Attending: Cardiovascular Disease

## 2020-01-30 ENCOUNTER — Other Ambulatory Visit: Payer: Self-pay

## 2020-01-30 DIAGNOSIS — R002 Palpitations: Secondary | ICD-10-CM | POA: Insufficient documentation

## 2020-01-30 DIAGNOSIS — R5383 Other fatigue: Secondary | ICD-10-CM | POA: Diagnosis not present

## 2020-01-30 DIAGNOSIS — R0602 Shortness of breath: Secondary | ICD-10-CM | POA: Diagnosis not present

## 2020-01-30 LAB — ECHOCARDIOGRAM COMPLETE
Area-P 1/2: 3.21 cm2
S' Lateral: 3.4 cm

## 2020-01-30 MED ORDER — PERFLUTREN LIPID MICROSPHERE
1.0000 mL | INTRAVENOUS | Status: AC | PRN
  Administered 2020-01-30: 3 mL via INTRAVENOUS

## 2020-02-24 ENCOUNTER — Other Ambulatory Visit: Payer: Self-pay

## 2020-02-24 MED ORDER — DILTIAZEM HCL ER COATED BEADS 120 MG PO CP24: 120.0000 mg | ORAL_CAPSULE | Freq: Every day | ORAL | 3 refills | Status: DC

## 2020-02-25 ENCOUNTER — Other Ambulatory Visit: Payer: Self-pay

## 2020-02-25 ENCOUNTER — Ambulatory Visit (HOSPITAL_BASED_OUTPATIENT_CLINIC_OR_DEPARTMENT_OTHER): Payer: 59 | Attending: Cardiovascular Disease | Admitting: Cardiovascular Disease

## 2020-02-25 DIAGNOSIS — R5383 Other fatigue: Secondary | ICD-10-CM | POA: Diagnosis not present

## 2020-02-25 DIAGNOSIS — G4736 Sleep related hypoventilation in conditions classified elsewhere: Secondary | ICD-10-CM | POA: Insufficient documentation

## 2020-02-25 DIAGNOSIS — G4733 Obstructive sleep apnea (adult) (pediatric): Secondary | ICD-10-CM

## 2020-02-25 DIAGNOSIS — R0683 Snoring: Secondary | ICD-10-CM | POA: Diagnosis not present

## 2020-02-25 DIAGNOSIS — R0902 Hypoxemia: Secondary | ICD-10-CM | POA: Diagnosis not present

## 2020-03-14 ENCOUNTER — Encounter (HOSPITAL_BASED_OUTPATIENT_CLINIC_OR_DEPARTMENT_OTHER): Payer: Self-pay | Admitting: Cardiovascular Disease

## 2020-03-14 NOTE — Procedures (Signed)
      Patient Name: Kristy Sharp, Kristy Sharp Date: 02/25/2020 Gender: Female D.O.B: [birth year] Age (years): 54 Referring Provider: Cassie Freer ONeal Height (inches): 79 Interpreting Physician: Shelva Majestic MD, ABSM Weight (lbs): 404 RPSGT: Jacolyn Reedy BMI: 65 MRN: [id number] Neck Size: 16.00  CLINICAL INFORMATION Sleep Study Type: HST  Indication for sleep study: snoring, fatigue, super morbid obesity  Epworth Sleepiness Score: 4  SLEEP STUDY TECHNIQUE A multi-channel overnight portable sleep study was performed. The channels recorded were: nasal airflow, thoracic respiratory movement, and oxygen saturation with a pulse oximetry. Snoring was also monitored.  MEDICATIONS albuterol (PROVENTIL HFA;VENTOLIN HFA) 108 (90 BASE) MCG/ACT inhaler cetirizine (ZYRTEC) 10 MG tablet Cholecalciferol (VITAMIN D PO) citalopram (CELEXA) 40 MG tablet cyanocobalamin (,VITAMIN B-12,) 1000 MCG/ML injection diltiazem (CARDIZEM CD) 120 MG 24 hr capsule FOLIC ACID PO furosemide (LASIX) 20 MG tablet levothyroxine (SYNTHROID) 75 MCG tablet LORazepam (ATIVAN) 1 MG tablet meloxicam (MOBIC) 15 MG tablet montelukast (SINGULAIR) 10 MG tablet Multiple Vitamins-Minerals (BARIATRIC FUSION) CHEW NONFORMULARY OR COMPOUNDED ITEM omeprazole (PRILOSEC) 20 MG capsule traZODone (DESYREL) 50 MG tablet Vitamin A 10000 units TABS Patient self administered medications include: N/A.  SLEEP ARCHITECTURE Patient was studied for 540.6 minutes. The sleep efficiency was 100.0 % and the patient was supine for 96.5%. The arousal index was 0.0 per hour.  RESPIRATORY PARAMETERS The overall AHI was 18.5 per hour, with a central apnea index of 0.0 per hour.  The oxygen nadir was 78% during sleep. Time spent < 89% was 35.1 minutes  CARDIAC DATA Mean heart rate during sleep was 57.4 bpm.  IMPRESSIONS - Moderate obstructive sleep apnea occurred during this study (AHI 18.5/h); however, the severity during REM  sleep cannot be assessed on this home study. - No significant central sleep apnea occurred during this study (CAI = 0.0/h). - Severe oxygen desaturation to a nadir of 78%. - Patient snored 14.1% during the sleep.  DIAGNOSIS - Obstructive Sleep Apnea (G47.33) - Nocturnal Hypoxemia (G47.36)  RECOMMENDATIONS - In this patient with super morbid obesity and cardiovascular co-morbidities recommend an in-lab CPAP titration study. If unable to schedule then suggest initial Auto-PAP with EPR of 3 at 6 - 18  cm of water. - Effort should be made to optimize nasal and oropharyngeal patency. - Avoid alcohol, sedatives and other CNS depressants that may worsen sleep apnea and disrupt normal sleep architecture. - Sleep hygiene should be reviewed to assess factors that may improve sleep quality. - Weight management (BMI 67)and regular exercise should be initiated or continued. - Return to Sleep Center to discuss the results of this study   [Electronically signed] 03/14/2020 11:29 AM  Shelva Majestic MD, [facility], ABSM Diplomate, American Board of Sleep Medicine   NPI: [id number] [place] PH: [id number]   FX: [phone] [place]

## 2020-03-24 ENCOUNTER — Telehealth: Payer: Self-pay | Admitting: *Deleted

## 2020-03-25 ENCOUNTER — Other Ambulatory Visit: Payer: Self-pay | Admitting: Cardiovascular Disease

## 2020-03-25 DIAGNOSIS — G4733 Obstructive sleep apnea (adult) (pediatric): Secondary | ICD-10-CM

## 2020-03-25 DIAGNOSIS — F5104 Psychophysiologic insomnia: Secondary | ICD-10-CM

## 2020-03-25 DIAGNOSIS — G4736 Sleep related hypoventilation in conditions classified elsewhere: Secondary | ICD-10-CM

## 2020-03-25 NOTE — Telephone Encounter (Signed)
Patient notified of HST results and recommendations. She voiced understanding saying that her mom has OSA. She agrees to proceed with CPAP titration study.

## 2020-03-26 ENCOUNTER — Telehealth: Payer: Self-pay | Admitting: Cardiovascular Disease

## 2020-03-26 NOTE — Telephone Encounter (Signed)
auth submitted through [employer]. Tracking # [id number]

## 2020-04-01 ENCOUNTER — Telehealth: Payer: Self-pay | Admitting: *Deleted

## 2020-04-01 NOTE — Telephone Encounter (Signed)
Mychart message sent to patient informing her CPAP machine has been ordered.

## 2020-04-09 DIAGNOSIS — K219 Gastro-esophageal reflux disease without esophagitis: Secondary | ICD-10-CM | POA: Insufficient documentation

## 2020-04-09 DIAGNOSIS — J019 Acute sinusitis, unspecified: Secondary | ICD-10-CM | POA: Insufficient documentation

## 2020-04-15 NOTE — Progress Notes (Unsigned)
Cardiology Office Note:   Date:  04/16/2020  NAME:  Kristy Sharp    MRN: [id number] DOB:  December 06, 1965   PCP:  Denny Levy, [place]  Cardiologist:  No primary care provider on file.  Electrophysiologist:  None   Referring MD: Denny Levy, PA   Chief Complaint  Patient presents with  . Follow-up    3 months.   History of Present Illness:   Kristy Sharp is a 55 y.o. female with a hx of morbid obesity BMI 65, venous insufficiency, atrial tachycardia, OSA who presents for follow-up. Seen for SOB/LE edema and palpitations. Echo normal with collapsing IVC. Venous reflux study positive. Brief a tach on monitor. Found to have sleep apnea. She reports she is doing fairly well.  She is taking diltiazem.  She is noticed that she still having rapid heartbeat sensation.  They last seconds.  She reports they may occur less frequently but no major improvement.  BP is a bit low.  No dizziness or lightheadedness.  She is working on wearing compression stockings.  She takes her Lasix as needed for venous interposition.  She also was diagnosed with sleep apnea and is waiting to get her sleep machine.  We need to expedite this.  She not lost any weight.  She has plans to work on diet and exercise in the next few months.  She is recently worked on Engineer, [employer] her kitchen.  Not able to eat as healthy as she would like.  She denies any chest pain or shortness of breath.  She can get fatigue with exertion.  She is trying to walk more on her lunch breaks.  Works for [employer].    Problem List 1. Morbid obesity  -BMI 65 -s/p bariatric surgery 2. B12 deficiency 3. Venous insufficiency  -bilateral great saphenous veins 4. Atrial tachycardia -Brief episodes ~8 seconds on zio  5. OSA -moderate 02/25/2020 6. Iron deficiency anemia   Past Medical History: Past Medical History:  Diagnosis Date  . Allergic rhinitis due to other allergen   . Allergy   . Anemia   . Anxiety   . Asthma   . B12 deficiency 04/27/2016  .  Bursitis, trochanteric   . Cellulitis   . Depression   . Headache   . Hypercholesteremia   . Hyperglycemia   . Hypothyroidism due to Hashimoto's thyroiditis 07/04/2017  . Insomnia   . Iron deficiency anemia 04/27/2016  . Morbid obesity ([place]) 07/04/2017  . Obesity   . Right lumbar radiculopathy 05/02/2013  . Sciatica   . Sinusitis   . Sleep apnea   . Vitamin B deficiency   . Vitamin D deficiency     Past Surgical History: Past Surgical History:  Procedure Laterality Date  . CHOLECYSTECTOMY N/A 04/27/2012   Procedure: LAPAROSCOPIC CHOLECYSTECTOMY;  Surgeon: Donato Heinz, MD;  Location: [facility];  Service: General;  Laterality: N/A;  . CHOLECYSTECTOMY    . EYE SURGERY    . GASTRIC BYPASS    . ORTHOPEDIC SURGERY    . REFRACTIVE SURGERY    . WRIST SURGERY Left     Current Medications: Current Meds  Medication Sig  . albuterol (PROVENTIL HFA;VENTOLIN HFA) 108 (90 BASE) MCG/ACT inhaler Inhale 2 puffs into the lungs every 6 (six) hours as needed for wheezing.  . cetirizine (ZYRTEC) 10 MG tablet Take 1 tablet (10 mg total) by mouth at bedtime.  . Cholecalciferol (VITAMIN D PO) Take 2 capsules by mouth at bedtime. 10,000 units per day  . citalopram (CELEXA)  40 MG tablet Take 1 tablet (40 mg total) by mouth daily.  . cyanocobalamin (,VITAMIN B-12,) 1000 MCG/ML injection Inject 1 mL (1,000 mcg total) into the muscle every 30 (thirty) days.  Marland Kitchen diltiazem (CARDIZEM CD) 120 MG 24 hr capsule Take 1 capsule (120 mg total) by mouth daily.  Marland Kitchen FOLIC ACID PO Take 1 tablet by mouth daily.  . furosemide (LASIX) 20 MG tablet Take 1 tablet (20 mg total) by mouth daily as needed.  Marland Kitchen levothyroxine (SYNTHROID) 75 MCG tablet Take 1 tablet (75 mcg total) by mouth daily before breakfast.  . LORazepam (ATIVAN) 1 MG tablet Take 1 tablet (1 mg total) by mouth daily as needed for anxiety.  . meloxicam (MOBIC) 15 MG tablet Take 1 tablet (15 mg total) by mouth daily as needed for pain.  . montelukast (SINGULAIR)  10 MG tablet Take 1 tablet (10 mg total) by mouth at bedtime.  . Multiple Vitamins-Minerals (BARIATRIC FUSION) CHEW 1 chew PO daily  . NONFORMULARY OR COMPOUNDED ITEM Bilateral knee-high compression leg wraps. Apply to lower extremities daily  . omeprazole (PRILOSEC) 20 MG capsule Take 1 capsule (20 mg total) by mouth daily as needed.  . traZODone (DESYREL) 50 MG tablet Take 1 tablet (50 mg total) by mouth at bedtime as needed for sleep.  . Vitamin A 10000 units TABS Take 0.5 tablets (5,000 Units total) by mouth daily.     Allergies:    Patient has no known allergies.   Social History: Social History   Socioeconomic History  . Marital status: Single    Spouse name: Not on file  . Number of children: 0  . Years of education: college  . Highest education level: Not on file  Occupational History  . 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 Smoking status: Never Smoker  . Smokeless tobacco: Never Used  Vaping Use  . Vaping Use: Never used  Substance and Sexual Activity  . Alcohol use: Yes    Alcohol/week: 1.0 standard drink    Types: 1 Cans of beer per week    Comment: occas.  . Drug use: No  . Sexual activity: Not Currently    Birth control/protection: Abstinence  Other Topics Concern  . Not on file  Social History Narrative   Patient is single   Patient is right-handed.   Patient works full time at a call center    Education some college   Caffeine coffee two cups daily and some times one soda            Social Determinants of Radio broadcast assistant Strain: Not on file  Food Insecurity: Not on file  Transportation Needs: Not on file  Physical Activity: Not on file  Stress: Not on file  Social Connections: Not on file     Family History: The patient's family history includes Arrhythmia in her mother; Cancer - Other in her sister; Colon cancer in her maternal grandmother; Gout in her mother; Heart Problems in her mother; Lung cancer in her father; Lupus in her  mother.  ROS:   All other ROS reviewed and negative. Pertinent positives noted in the HPI.     EKGs/Labs/Other Studies Reviewed:   The following studies were personally reviewed by me today:  TTE 01/30/2020 1. Left ventricular ejection fraction, by estimation, is 60 to 65%. The  left ventricle has normal function. The left ventricle has no regional  wall motion abnormalities. Left ventricular diastolic parameters were  normal.  2. Right ventricular  systolic function is normal. The right ventricular  size is normal.  3. Left atrial size was mildly dilated.  4. The mitral valve is normal in structure. Trivial mitral valve  regurgitation. No evidence of mitral stenosis.  5. The aortic valve is tricuspid. Aortic valve regurgitation is not  visualized. Mild aortic valve sclerosis is present, with no evidence of  aortic valve stenosis.  6. The inferior vena cava is normal in size with greater than 50%  respiratory variability, suggesting right atrial pressure of 3 mmHg.   Venous Reflux [place] 01/23/2020 Summary:  Bilateral:  - No evidence of deep vein thrombosis seen in the lower extremities,  bilaterally, from the common femoral through the popliteal veins.  - No evidence of superficial venous thrombosis in the lower extremities,  bilaterally.  - No evidence of deep venous insufficiency seen bilaterally in the lower  extremity.  - No evidence of superficial venous reflux seen in the short saphenous  veins bilaterally.  - Limited visualization of the bilateral PTVs and peroneal veins due to  body habitus and edema. Specifically, the tibioperoneal confluence and  popliteal veins are without thrombus.   Right:  - Venous reflux is noted in the right greater saphenous vein in the calf.    Left:  - Venous reflux is noted in the left greater saphenous vein in the knee  and calf.   Zio 01/28/2020 1. Brief ectopic atrial tachycardia episodes (22 episodes in 3 days; longest duration  7.8 seconds). 2. Symptoms occurred with PACs.  3. Rare PACs (<1%).    Recent Labs: 01/09/2020: Hemoglobin 12.6; Platelets 197   Recent Lipid Panel No results found for: CHOL, TRIG, HDL, CHOLHDL, VLDL, LDLCALC, LDLDIRECT  Physical Exam:   VS:  BP 98/70 (BP Location: Left Arm, Patient Position: Sitting, Cuff Size: Large)   Pulse 76   Ht 5\' 6"  (1.676 m)   Wt (!) 406 lb (184.2 kg)   BMI 65.53 kg/m    Wt Readings from Last 3 Encounters:  04/16/20 (!) 406 lb (184.2 kg)  02/25/20 (!) 404 lb (183.3 kg)  01/06/20 (!) 406 lb (184.2 kg)    General: Well nourished, well developed, in no acute distress Head: Atraumatic, normal size  Eyes: PEERLA, EOMI  Neck: Supple, no JVD Endocrine: No thryomegaly Cardiac: Normal S1, S2; RRR; no murmurs, rubs, or gallops Lungs: Clear to auscultation bilaterally, no wheezing, rhonchi or rales  Abd: Soft, nontender, no hepatomegaly  Ext: Venous insufficiency changes noted Musculoskeletal: No deformities, BUE and BLE strength normal and equal Skin: Warm and dry, no rashes   Neuro: Alert and oriented to person, place, time, and situation, CNII-XII grossly intact, no focal deficits  Psych: Normal mood and affect   ASSESSMENT:   Kristy Sharp is a 55 y.o. female who presents for the following: 1. Venous insufficiency   2. Obstructive sleep apnea (adult) (pediatric)   3. Obesity, morbid, BMI 50 or higher ([place])   4. Atrial tachycardia ([place])     PLAN:   1. Venous insufficiency -Lower extremity edema.  Venous reflux study positive.  Echocardiogram with collapsing IVC.  This is not congestive heart failure.  BNP normal. -She should continue compression stockings.  Lasix as needed.  Weight loss has been recommended.  2. Obstructive sleep apnea  -Diagnosed with moderate sleep apnea.  Needs this addressed.  Awaiting her CPAP machine.  3. Obesity, morbid, BMI 50 or higher (HCC) -Diet and exercise have been recommended  4. Atrial tachycardia (HCC) -Brief  atrial  tachycardia episodes captured on monitor.  She was started on metoprolol but had issues with that.  We switch her to diltiazem.  She is tolerating this well.  Still having episodes at last seconds per day.  Overall may be improving.  Echocardiogram is normal.  I think ultimately her arrhythmias will calm down with treatment of her sleep apnea.  Hopefully we can get this expedited.   Disposition: Return in about 6 months (around 10/14/2020).  Medication Adjustments/Labs and Tests Ordered: Current medicines are reviewed at length with the patient today.  Concerns regarding medicines are outlined above.  No orders of the defined types were placed in this encounter.  No orders of the defined types were placed in this encounter.   Patient Instructions  Medication Instructions:  The current medical regimen is effective;  continue present plan and medications.  *If you need a refill on your cardiac medications before your next appointment, please call your pharmacy*   Follow-Up: At [facility], you and your health needs are our priority.  As part of our continuing mission to provide you with exceptional heart care, we have created designated Provider Care Teams.  These Care Teams include your primary Cardiologist (physician) and Advanced Practice Providers (APPs -  Physician Assistants and Nurse Practitioners) who all work together to provide you with the care you need, when you need it.  We recommend signing up for the patient portal called "MyChart".  Sign up information is provided on this After Visit Summary.  MyChart is used to connect with patients for Virtual Visits (Telemedicine).  Patients are able to view lab/test results, encounter notes, upcoming appointments, etc.  Non-urgent messages can be sent to your provider as well.   To learn more about what you can do with MyChart, go to [email or web address].    Your next appointment:   6 month(s)  The format for your next  appointment:   In Person  Provider:   Eleonore Chiquito, MD        Time Spent with Patient: I have spent a total of 25 minutes with patient reviewing hospital notes, telemetry, EKGs, labs and examining the patient as well as establishing an assessment and plan that was discussed with the patient.  > 50% of time was spent in direct patient care.  Signed, Addison Naegeli. Audie Box, [place]  [address], [place], [place] [address] [phone]  04/16/2020 10:50 AM

## 2020-04-16 ENCOUNTER — Other Ambulatory Visit: Payer: Self-pay

## 2020-04-16 ENCOUNTER — Ambulatory Visit: Payer: 59 | Admitting: Cardiovascular Disease

## 2020-04-16 ENCOUNTER — Encounter: Payer: Self-pay | Admitting: Cardiovascular Disease

## 2020-04-16 VITALS — BP 98/70 | HR 76 | Ht 66.0 in | Wt >= 6400 oz

## 2020-04-16 DIAGNOSIS — G4733 Obstructive sleep apnea (adult) (pediatric): Secondary | ICD-10-CM | POA: Diagnosis not present

## 2020-04-16 DIAGNOSIS — I872 Venous insufficiency (chronic) (peripheral): Secondary | ICD-10-CM | POA: Diagnosis not present

## 2020-04-16 DIAGNOSIS — I471 Supraventricular tachycardia: Secondary | ICD-10-CM

## 2020-04-16 NOTE — Patient Instructions (Signed)
Medication Instructions:  The current medical regimen is effective;  continue present plan and medications.  *If you need a refill on your cardiac medications before your next appointment, please call your pharmacy*   Follow-Up: At [facility], you and your health needs are our priority.  As part of our continuing mission to provide you with exceptional heart care, we have created designated Provider Care Teams.  These Care Teams include your primary Cardiologist (physician) and Advanced Practice Providers (APPs -  Physician Assistants and Nurse Practitioners) who all work together to provide you with the care you need, when you need it.  We recommend signing up for the patient portal called "MyChart".  Sign up information is provided on this After Visit Summary.  MyChart is used to connect with patients for Virtual Visits (Telemedicine).  Patients are able to view lab/test results, encounter notes, upcoming appointments, etc.  Non-urgent messages can be sent to your provider as well.   To learn more about what you can do with MyChart, go to [email or web address].    Your next appointment:   6 month(s)  The format for your next appointment:   In Person  Provider:   Mineola O'Neal, MD      

## 2020-04-20 DIAGNOSIS — G4733 Obstructive sleep apnea (adult) (pediatric): Secondary | ICD-10-CM | POA: Insufficient documentation

## 2020-07-01 ENCOUNTER — Other Ambulatory Visit: Payer: Self-pay

## 2020-07-01 ENCOUNTER — Inpatient Hospital Stay: Payer: 59 | Attending: Hematology and Oncology

## 2020-07-01 ENCOUNTER — Telehealth: Payer: Self-pay

## 2020-07-01 DIAGNOSIS — Z79899 Other long term (current) drug therapy: Secondary | ICD-10-CM | POA: Diagnosis not present

## 2020-07-01 DIAGNOSIS — D508 Other iron deficiency anemias: Secondary | ICD-10-CM

## 2020-07-01 DIAGNOSIS — Z8249 Family history of ischemic heart disease and other diseases of the circulatory system: Secondary | ICD-10-CM | POA: Insufficient documentation

## 2020-07-01 DIAGNOSIS — Z9884 Bariatric surgery status: Secondary | ICD-10-CM | POA: Diagnosis not present

## 2020-07-01 DIAGNOSIS — K909 Intestinal malabsorption, unspecified: Secondary | ICD-10-CM | POA: Diagnosis not present

## 2020-07-01 DIAGNOSIS — E538 Deficiency of other specified B group vitamins: Secondary | ICD-10-CM | POA: Diagnosis not present

## 2020-07-01 DIAGNOSIS — E559 Vitamin D deficiency, unspecified: Secondary | ICD-10-CM | POA: Insufficient documentation

## 2020-07-01 DIAGNOSIS — Z791 Long term (current) use of non-steroidal anti-inflammatories (NSAID): Secondary | ICD-10-CM | POA: Diagnosis not present

## 2020-07-01 DIAGNOSIS — Z809 Family history of malignant neoplasm, unspecified: Secondary | ICD-10-CM | POA: Insufficient documentation

## 2020-07-01 DIAGNOSIS — D509 Iron deficiency anemia, unspecified: Secondary | ICD-10-CM | POA: Diagnosis present

## 2020-07-01 DIAGNOSIS — Z800 Family history of malignant neoplasm of digestive organs: Secondary | ICD-10-CM | POA: Insufficient documentation

## 2020-07-01 LAB — CBC WITH DIFFERENTIAL/PLATELET
Abs Immature Granulocytes: 0.02 10*3/uL (ref 0.00–0.07)
Basophils Absolute: 0.1 10*3/uL (ref 0.0–0.1)
Basophils Relative: 1 %
Eosinophils Absolute: 0.1 10*3/uL (ref 0.0–0.5)
Eosinophils Relative: 3 %
HCT: 40.1 % (ref 36.0–46.0)
Hemoglobin: 13.1 g/dL (ref 12.0–15.0)
Immature Granulocytes: 0 %
Lymphocytes Relative: 24 %
Lymphs Abs: 1.2 10*3/uL (ref 0.7–4.0)
MCH: 32.5 pg (ref 26.0–34.0)
MCHC: 32.7 g/dL (ref 30.0–36.0)
MCV: 99.5 fL (ref 80.0–100.0)
Monocytes Absolute: 0.5 10*3/uL (ref 0.1–1.0)
Monocytes Relative: 9 %
Neutro Abs: 3.1 10*3/uL (ref 1.7–7.7)
Neutrophils Relative %: 63 %
Platelets: 229 10*3/uL (ref 150–400)
RBC: 4.03 MIL/uL (ref 3.87–5.11)
RDW: 13.5 % (ref 11.5–15.5)
WBC: 4.9 10*3/uL (ref 4.0–10.5)
nRBC: 0 % (ref 0.0–0.2)

## 2020-07-01 LAB — IRON AND TIBC
Iron: 123 ug/dL (ref 41–142)
Saturation Ratios: 34 % (ref 21–57)
TIBC: 361 ug/dL (ref 236–444)
UIBC: 239 ug/dL (ref 120–384)

## 2020-07-01 LAB — VITAMIN B12: Vitamin B-12: 1504 pg/mL — ABNORMAL HIGH (ref 180–914)

## 2020-07-01 LAB — VITAMIN D 25 HYDROXY (VIT D DEFICIENCY, FRACTURES): Vit D, 25-Hydroxy: 36.37 ng/mL (ref 30–100)

## 2020-07-01 LAB — FERRITIN: Ferritin: 20 ng/mL (ref 11–307)

## 2020-07-01 NOTE — Telephone Encounter (Signed)
-----   Message from Heath Lark, MD sent at 07/01/2020  1:27 PM EDT ----- I was just looking over her test results Previously we scheduled virtual visit All her labs are back today Can you call and ask if she wants to switch to virtual next week? Or I can see her or virtual visit tomorrow since I have all test results back

## 2020-07-01 NOTE — Telephone Encounter (Signed)
Spoke with pt regarding message below per MD Alvy Bimler. Pt verbalizes understanding of appt time.

## 2020-07-02 ENCOUNTER — Inpatient Hospital Stay (HOSPITAL_BASED_OUTPATIENT_CLINIC_OR_DEPARTMENT_OTHER): Payer: 59 | Admitting: Hematology and Oncology

## 2020-07-02 ENCOUNTER — Encounter: Payer: Self-pay | Admitting: Hematology and Oncology

## 2020-07-02 DIAGNOSIS — D508 Other iron deficiency anemias: Secondary | ICD-10-CM | POA: Diagnosis not present

## 2020-07-02 DIAGNOSIS — E538 Deficiency of other specified B group vitamins: Secondary | ICD-10-CM

## 2020-07-02 DIAGNOSIS — E559 Vitamin D deficiency, unspecified: Secondary | ICD-10-CM | POA: Diagnosis not present

## 2020-07-02 NOTE — Assessment & Plan Note (Signed)
She has persistent iron deficiency likely due to malabsorption from gastric bypass surgery Even though she is not anemic, she is symptomatic with excessive fatigue and leg cramps I recommend IV iron sucrose 300 mg x 2 I plan to see her again in 6 months for further follow-up I recommend the patient to consider discontinuation of meloxicam to reduce risk of GI bleed

## 2020-07-02 NOTE — Assessment & Plan Note (Signed)
Her B12 level has improved I recommend she modify the B12 injection to every other month

## 2020-07-02 NOTE — Progress Notes (Signed)
HEMATOLOGY-ONCOLOGY ELECTRONIC VISIT PROGRESS NOTE  Patient Care Team: Denny Levy, [place] as PCP - General (Family Medicine) Janetta Hora, MD as Consulting Physician (Hematology) Moody Bruins, MD as Consulting Physician (Ophthalmology)  I connected with by [facility] video conference and verified that I am speaking with the correct person using two identifiers.  I discussed the limitations, risks, security and privacy concerns of performing an evaluation and management service by [facility] and the availability of in person appointments.  I also discussed with the patient that there may be a patient responsible charge related to this service. The patient expressed understanding and agreed to proceed.   ASSESSMENT & PLAN:  Iron deficiency anemia She has persistent iron deficiency likely due to malabsorption from gastric bypass surgery Even though she is not anemic, she is symptomatic with excessive fatigue and leg cramps I recommend IV iron sucrose 300 mg x 2 I plan to see her again in 6 months for further follow-up I recommend the patient to consider discontinuation of meloxicam to reduce risk of GI bleed  B12 deficiency Her B12 level has improved I recommend she modify the B12 injection to every other month  Vitamin D deficiency Her vitamin D level has improved She will continue high-dose vitamin D supplement   No orders of the defined types were placed in this encounter.   INTERVAL HISTORY: Please see below for problem oriented charting. The purpose of today's visit is to review test results Patient had Roux-en-Y gastric bypass surgery with multiple mineral deficiencies She is tolerating B12 injections well The patient denies any recent signs or symptoms of bleeding such as spontaneous epistaxis, hematuria or hematochezia. 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 She had not noticed any recent bleeding such as epistaxis, hematuria or hematochezia The patient has used NSAID for chronic joint pain. She is not on antiplatelets agents.  She denies any pica and eats a variety of diet. She never donated blood or received blood transfusion She used to take vitamin B12 injections but have stopped; and then resumed She has received multiple doses of IV iron Feraheme in the past few years  REVIEW OF SYSTEMS:   Constitutional: Denies fevers, chills or abnormal weight loss Eyes: Denies blurriness of vision Ears, nose, mouth, throat, and face: Denies mucositis or sore throat Respiratory: Denies cough, dyspnea or wheezes Cardiovascular: Denies palpitation, chest discomfort Gastrointestinal:  Denies nausea, heartburn or change in bowel habits Skin: Denies abnormal skin rashes Lymphatics: Denies new lymphadenopathy or easy bruising Neurological:Denies numbness, tingling or new weaknesses Behavioral/Psych: Mood is stable, no new changes  Extremities: No lower extremity edema All other systems were reviewed  with the patient and are negative.  I have reviewed the past medical  history, past surgical history, social history and family history with the patient and they are unchanged from previous note.  ALLERGIES:  has No Known Allergies.  MEDICATIONS:  Current Outpatient Medications  Medication Sig Dispense Refill  . albuterol (PROVENTIL HFA;VENTOLIN HFA) 108 (90 BASE) MCG/ACT inhaler Inhale 2 puffs into the lungs every 6 (six) hours as needed for wheezing.    . cetirizine (ZYRTEC) 10 MG tablet Take 1 tablet (10 mg total) by mouth at bedtime. 90 tablet 3  . Cholecalciferol (VITAMIN D PO) Take 2 capsules by mouth at bedtime. 10,000 units per day    . citalopram (CELEXA) 40 MG tablet Take 1 tablet (40 mg total) by mouth daily. 90 tablet 1  . cyanocobalamin (,VITAMIN B-12,) 1000 MCG/ML injection Inject 1 mL (1,000 mcg total) into the muscle every 30 (thirty) days. 1 mL 11  . diltiazem (CARDIZEM CD) 120 MG 24 hr capsule Take 1 capsule (120 mg total) by mouth daily. 90 capsule 3  . FOLIC ACID PO Take 1 tablet by mouth daily.    . furosemide (LASIX) 20 MG tablet Take 1 tablet (20 mg total) by mouth daily as needed. 30 tablet 3  . levothyroxine (SYNTHROID) 75 MCG tablet Take 1 tablet (75 mcg total) by mouth daily before breakfast. 90 tablet 1  . LORazepam (ATIVAN) 1 MG tablet Take 1 tablet (1 mg total) by mouth daily as needed for anxiety. 90 tablet 0  . meloxicam (MOBIC) 15 MG tablet Take 1 tablet (15 mg total) by mouth daily as needed for pain. 90 tablet 3  . montelukast (SINGULAIR) 10 MG tablet Take 1 tablet (10 mg total) by mouth at bedtime. 90 tablet 1  . Multiple Vitamins-Minerals (BARIATRIC FUSION) CHEW 1 chew PO daily 90 tablet 3  . NONFORMULARY OR COMPOUNDED ITEM Bilateral knee-high compression leg wraps. Apply to lower extremities daily 2 each 0  . omeprazole (PRILOSEC) 20 MG capsule Take 1 capsule (20 mg total) by mouth daily as needed. 90 capsule 1  . traZODone (DESYREL) 50 MG tablet Take 1 tablet (50 mg total) by mouth at bedtime as needed for sleep. 90 tablet 3   . Vitamin A 10000 units TABS Take 0.5 tablets (5,000 Units total) by mouth daily. 30 each 0   No current facility-administered medications for this visit.    PHYSICAL EXAMINATION: ECOG PERFORMANCE STATUS: 1 - Symptomatic but completely ambulatory  LABORATORY DATA:  I have reviewed the data as listed CMP Latest Ref Rng & Units 03/12/2018 03/12/2018 04/11/2017  Glucose 65 - 99 mg/dL - 95 100(H)  BUN 7 - 25 mg/dL - 15 15  Creatinine 0.50 - 1.05 mg/dL - 0.62 0.74  Sodium 135 - 146 mmol/L - 141 138  Potassium 3.5 - 5.3 mmol/L - 4.1 3.9  Chloride 98 - 110 mmol/L - 110 105  CO2 20 - 32 mmol/L - 24 24  Calcium 8.6 - 10.4 mg/dL 8.8 8.8 8.7(L)  Total Protein 6.1 - 8.1 g/dL - 6.0(L) 6.6  Total Bilirubin 0.2 - 1.2 mg/dL - 0.4 0.6  Alkaline Phos 38 - 126 U/L - - 49  AST 10 - 35 U/L - 16 19  ALT 6 - 29 U/L - 8 13(L)    Lab Results  Component Value Date   WBC 4.9 07/01/2020   HGB 13.1 07/01/2020   HCT 40.1 07/01/2020   MCV 99.5 07/01/2020   PLT 229 07/01/2020  NEUTROABS 3.1 07/01/2020     I discussed the assessment and treatment plan with the patient. The patient was provided an opportunity to ask questions and all were answered. The patient agreed with the plan and demonstrated an understanding of the instructions. The patient was advised to call back or seek an in-person evaluation if the symptoms worsen or if the condition fails to improve as anticipated.    I spent 20 minutes for the appointment reviewing test results, discuss management and coordination of care.  Heath Lark, MD 07/02/2020 10:17 AM

## 2020-07-02 NOTE — Assessment & Plan Note (Signed)
Her vitamin D level has improved She will continue high-dose vitamin D supplement

## 2020-07-09 ENCOUNTER — Ambulatory Visit: Payer: 59 | Admitting: Hematology and Oncology

## 2020-07-17 ENCOUNTER — Other Ambulatory Visit: Payer: Self-pay

## 2020-07-17 ENCOUNTER — Inpatient Hospital Stay: Payer: 59

## 2020-07-17 VITALS — BP 122/57 | HR 60 | Temp 97.9°F | Resp 18

## 2020-07-17 DIAGNOSIS — D508 Other iron deficiency anemias: Secondary | ICD-10-CM

## 2020-07-17 DIAGNOSIS — D509 Iron deficiency anemia, unspecified: Secondary | ICD-10-CM | POA: Diagnosis not present

## 2020-07-17 MED ORDER — SODIUM CHLORIDE 0.9 % IV SOLN
Freq: Once | INTRAVENOUS | Status: AC
  Filled 2020-07-17: qty 250

## 2020-07-17 MED ORDER — SODIUM CHLORIDE 0.9 % IV SOLN
300.0000 mg | Freq: Once | INTRAVENOUS | Status: AC
  Administered 2020-07-17: 300 mg via INTRAVENOUS
  Filled 2020-07-17: qty 300

## 2020-07-17 NOTE — Patient Instructions (Signed)

## 2020-07-24 ENCOUNTER — Inpatient Hospital Stay: Payer: 59 | Attending: Hematology and Oncology

## 2020-07-24 ENCOUNTER — Other Ambulatory Visit: Payer: Self-pay

## 2020-07-24 VITALS — BP 98/54 | HR 64 | Temp 98.1°F | Resp 16

## 2020-07-24 DIAGNOSIS — Z800 Family history of malignant neoplasm of digestive organs: Secondary | ICD-10-CM | POA: Insufficient documentation

## 2020-07-24 DIAGNOSIS — Z79899 Other long term (current) drug therapy: Secondary | ICD-10-CM | POA: Diagnosis not present

## 2020-07-24 DIAGNOSIS — Z9884 Bariatric surgery status: Secondary | ICD-10-CM | POA: Diagnosis not present

## 2020-07-24 DIAGNOSIS — E538 Deficiency of other specified B group vitamins: Secondary | ICD-10-CM | POA: Diagnosis not present

## 2020-07-24 DIAGNOSIS — Z8249 Family history of ischemic heart disease and other diseases of the circulatory system: Secondary | ICD-10-CM | POA: Diagnosis not present

## 2020-07-24 DIAGNOSIS — K909 Intestinal malabsorption, unspecified: Secondary | ICD-10-CM | POA: Diagnosis not present

## 2020-07-24 DIAGNOSIS — D509 Iron deficiency anemia, unspecified: Secondary | ICD-10-CM | POA: Diagnosis present

## 2020-07-24 DIAGNOSIS — D508 Other iron deficiency anemias: Secondary | ICD-10-CM

## 2020-07-24 DIAGNOSIS — Z791 Long term (current) use of non-steroidal anti-inflammatories (NSAID): Secondary | ICD-10-CM | POA: Insufficient documentation

## 2020-07-24 DIAGNOSIS — Z809 Family history of malignant neoplasm, unspecified: Secondary | ICD-10-CM | POA: Insufficient documentation

## 2020-07-24 DIAGNOSIS — E559 Vitamin D deficiency, unspecified: Secondary | ICD-10-CM | POA: Insufficient documentation

## 2020-07-24 MED ORDER — SODIUM CHLORIDE 0.9 % IV SOLN
Freq: Once | INTRAVENOUS | Status: AC
  Filled 2020-07-24: qty 250

## 2020-07-24 MED ORDER — IRON SUCROSE 20 MG/ML IV SOLN
300.0000 mg | Freq: Once | INTRAVENOUS | Status: AC
  Administered 2020-07-24: 300 mg via INTRAVENOUS
  Filled 2020-07-24: qty 300

## 2020-07-24 NOTE — Patient Instructions (Signed)

## 2020-09-03 ENCOUNTER — Other Ambulatory Visit: Payer: Self-pay

## 2020-09-03 ENCOUNTER — Ambulatory Visit: Payer: 59 | Admitting: Family Medicine

## 2020-09-03 ENCOUNTER — Encounter: Payer: Self-pay | Admitting: Family Medicine

## 2020-09-03 VITALS — BP 105/66 | HR 77 | Ht 66.0 in | Wt >= 6400 oz

## 2020-09-03 DIAGNOSIS — E063 Autoimmune thyroiditis: Secondary | ICD-10-CM

## 2020-09-03 DIAGNOSIS — E038 Other specified hypothyroidism: Secondary | ICD-10-CM | POA: Diagnosis not present

## 2020-09-03 DIAGNOSIS — E610 Copper deficiency: Secondary | ICD-10-CM

## 2020-09-03 DIAGNOSIS — R350 Frequency of micturition: Secondary | ICD-10-CM

## 2020-09-03 DIAGNOSIS — E538 Deficiency of other specified B group vitamins: Secondary | ICD-10-CM | POA: Diagnosis not present

## 2020-09-03 DIAGNOSIS — M2550 Pain in unspecified joint: Secondary | ICD-10-CM

## 2020-09-03 DIAGNOSIS — F5104 Psychophysiologic insomnia: Secondary | ICD-10-CM

## 2020-09-03 DIAGNOSIS — E041 Nontoxic single thyroid nodule: Secondary | ICD-10-CM

## 2020-09-03 DIAGNOSIS — F419 Anxiety disorder, unspecified: Secondary | ICD-10-CM

## 2020-09-03 DIAGNOSIS — E559 Vitamin D deficiency, unspecified: Secondary | ICD-10-CM

## 2020-09-03 DIAGNOSIS — Z9884 Bariatric surgery status: Secondary | ICD-10-CM

## 2020-09-03 DIAGNOSIS — D508 Other iron deficiency anemias: Secondary | ICD-10-CM

## 2020-09-03 NOTE — Patient Instructions (Signed)
Great to meet you! We'll be in touch with lab and imaging results.

## 2020-09-03 NOTE — Assessment & Plan Note (Signed)
She is using an combination of trazodone and lorazepam at bedtime.  This is working well for her without significant side effects.

## 2020-09-03 NOTE — Assessment & Plan Note (Signed)
Continue citalopram daily.

## 2020-09-03 NOTE — Assessment & Plan Note (Signed)
She has had some urinary frequency and incontinence.  Checking urinalysis as well as culture today.

## 2020-09-03 NOTE — Assessment & Plan Note (Signed)
Kristy Sharp also she has had thyroid function test.  Updating TSH plus T4 today.  She does also have some fullness to the right side of her thyroid and I have ordered a thyroid ultrasound.

## 2020-09-03 NOTE — Assessment & Plan Note (Signed)
History of gastric bypass with multiple vitamin deficiencies.  She did recently have a an iron infusion due to low ferritin.  She is followed by hematology.  Updating labs today.

## 2020-09-03 NOTE — Assessment & Plan Note (Signed)
Chronic arthralgias with history of mildly positive ANA.  Updating autoimmune panel today.

## 2020-09-03 NOTE — Progress Notes (Signed)
Kristy Sharp - 55 y.o. female MRN [id number]  Date of birth: [birth year]  Subjective Chief Complaint  Patient presents with   Establish Care    HPI Kristy Sharp is a 56 year old female here today to reestablish care.  She has a history of hypothyroidism, gastric bypass with multiple vitamin deficiencies, palpitations, insomnia and recent diagnosis of sleep apnea.  She has concerns today about globus sensation.  Reports that she sometimes feels tightness in her neck and like something is in the back of her throat.  This comes and goes.  She denies any difficulty breathing, tightness in her chest, wheezing or stridor.  He has a ENT and is taking omeprazole.  But this really has not made any difference.  She does have some occasional postnasal drainage.  She denies any swelling of her neck and has not noted any change with eating.  She has also had some joint pain with fatigue.  She does have history of mildly positive ANA.  She did see rheumatology however states that she was told everything looked okay.  She does have history of Hashimoto's disease with resultant hypothyroidism.  She has not had her thyroid labs checked in several months.  She does have generalized anxiety with insomnia as well.  She is taking citalopram with trazodone and lorazepam at bedtime.  She has noted some frequency and incontinence of the urine as well.  This typically occurs first thing in the morning.  She denies pain with urination.  ROS:  A comprehensive ROS was completed and negative except as noted per HPI  No Known Allergies  Past Medical History:  Diagnosis Date   Allergic rhinitis due to other allergen    Allergy    Anemia    Anxiety    Asthma    B12 deficiency 04/27/2016   Bursitis, trochanteric    Cellulitis    Depression    Headache    Hypercholesteremia    Hyperglycemia    Hypothyroidism due to Hashimoto's thyroiditis 07/04/2017   Insomnia    Iron deficiency anemia 04/27/2016   Morbid obesity ([place])  07/04/2017   Obesity    Right lumbar radiculopathy 05/02/2013   Sciatica    Sinusitis    Sleep apnea    Vitamin B deficiency    Vitamin D deficiency     Past Surgical History:  Procedure Laterality Date   CHOLECYSTECTOMY N/A 04/27/2012   Procedure: LAPAROSCOPIC CHOLECYSTECTOMY;  Surgeon: Donato Heinz, MD;  Location: [facility];  Service: General;  Laterality: N/A;   CHOLECYSTECTOMY     EYE SURGERY     GASTRIC BYPASS     ORTHOPEDIC SURGERY     REFRACTIVE SURGERY     WRIST SURGERY Left     Social History   Socioeconomic History   Marital status: Single    Spouse name: Not on file   Number of children: 0   Years of education: college   Highest education level: Not on file  Occupational History   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   Drug use: No   Sexual activity: Not Currently    Birth control/protection: Abstinence  Other Topics Concern   Not on file  Social History Narrative   Patient is single   Patient is right-handed.   Patient works full time at a call center    Education some college   Caffeine coffee two cups daily and some times one soda            Social Determinants of Radio broadcast assistant Strain: Not on file  Food Insecurity: Not on file  Transportation Needs: Not on file  Physical Activity: Not on file  Stress: Not on file  Social Connections: Not on file    Family History  Problem Relation Age of Onset   Lupus Mother    Heart Problems Mother    Gout Mother    Arrhythmia Mother    Lung cancer Father    Colon cancer Maternal Grandmother    Cancer - Other Sister     Health Maintenance  Topic Date Due   Pneumococcal Vaccine 50-7 Years old (1 - PCV) Never done   HIV Screening  Never  done   Hepatitis C Screening  Never done   PAP SMEAR-Modifier  Never done   COLONOSCOPY (Pts 45-57yrs Insurance coverage will need to be confirmed)  Never done   MAMMOGRAM  03/02/2018   Zoster Vaccines- Shingrix (2 of 2) 02/27/2019   COVID-19 Vaccine (4 - Booster for [place] series) 05/05/2020   INFLUENZA VACCINE  09/21/2020   TETANUS/TDAP  12/03/2023   HPV VACCINES  Aged Out     ----------------------------------------------------------------------------------------------------------------------------------------------------------------------------------------------------------------- Physical Exam BP 105/66 (BP Location: Left Wrist, Patient Position: Sitting, Cuff Size: Normal)   Pulse 77   Ht 5\' 6"  (1.676 m)   Wt (!) 407 lb (184.6 kg)   SpO2 95%   BMI 65.69 kg/m   Physical Exam Constitutional:      Appearance: Normal appearance.  HENT:     Head: Normocephalic and atraumatic.  Eyes:     General: No scleral icterus. Neck:     Comments: She is to be some fullness along the right side of the thyroid. Cardiovascular:     Rate and Rhythm: Normal rate and regular rhythm.  Pulmonary:     Effort: Pulmonary effort is normal.     Breath sounds: Normal breath sounds.  Skin:    General: Skin is warm and dry.  Neurological:     General: No focal deficit present.     Mental Status: She is alert.  Psychiatric:        Mood and Affect: Mood normal.        Behavior: Behavior normal.    ------------------------------------------------------------------------------------------------------------------------------------------------------------------------------------------------------------------- Assessment and Plan  Hypothyroidism due to Hashimoto's thyroiditis Sital also she has had thyroid function test.  Updating TSH plus T4 today.  She does also have some fullness to the right side of her thyroid and I have ordered a thyroid ultrasound.  H/O gastric bypass History of gastric  bypass with multiple vitamin deficiencies.  She did recently have a an iron infusion due to low ferritin.  She is followed by hematology.  Updating labs today.  Chronic insomnia She is using an combination of trazodone and lorazepam at bedtime.  This is working well for her without significant side effects.  Chronic anxiety Continue citalopram daily.  Urinary frequency She has had some urinary frequency and incontinence.  Checking urinalysis as well as culture today.  Arthralgia Chronic arthralgias with history of mildly positive ANA.  Updating autoimmune panel today.   No orders of the defined types were placed in this encounter.   No follow-ups  on file.    This visit occurred during the SARS-CoV-2 public health emergency.  Safety protocols were in place, including screening questions prior to the visit, additional usage of staff PPE, and extensive cleaning of exam room while observing appropriate contact time as indicated for disinfecting solutions.

## 2020-09-08 LAB — COMPLETE METABOLIC PANEL WITH GFR
AG Ratio: 1.4 (calc) (ref 1.0–2.5)
ALT: 9 U/L (ref 6–29)
AST: 16 U/L (ref 10–35)
Albumin: 3.9 g/dL (ref 3.6–5.1)
Alkaline phosphatase (APISO): 62 U/L (ref 37–153)
BUN: 15 mg/dL (ref 7–25)
CO2: 25 mmol/L (ref 20–32)
Calcium: 9.2 mg/dL (ref 8.6–10.4)
Chloride: 108 mmol/L (ref 98–110)
Creat: 0.78 mg/dL (ref 0.50–1.03)
Globulin: 2.7 g/dL (calc) (ref 1.9–3.7)
Glucose, Bld: 94 mg/dL (ref 65–99)
Potassium: 4.1 mmol/L (ref 3.5–5.3)
Sodium: 142 mmol/L (ref 135–146)
Total Bilirubin: 0.4 mg/dL (ref 0.2–1.2)
Total Protein: 6.6 g/dL (ref 6.1–8.1)
eGFR: 90 mL/min/{1.73_m2} (ref >=60)

## 2020-09-08 LAB — T4, FREE: Free T4: 1.1 ng/dL (ref 0.8–1.8)

## 2020-09-08 LAB — ANA,IFA RA DIAG PNL W/RFLX TIT/PATN
Anti Nuclear Antibody (ANA): NEGATIVE
Cyclic Citrullin Peptide Ab: <16 U
Rheumatoid fact SerPl-aCnc: <14 [IU]/mL

## 2020-09-08 LAB — URINALYSIS, ROUTINE W REFLEX MICROSCOPIC
Bilirubin Urine: NEGATIVE
Glucose, UA: NEGATIVE
Hgb urine dipstick: NEGATIVE
Leukocytes,Ua: NEGATIVE
Nitrite: NEGATIVE
Protein, ur: NEGATIVE
Specific Gravity, Urine: 1.029 (ref 1.001–1.035)
pH: <=5 (ref 5.0–8.0)

## 2020-09-08 LAB — CBC WITH DIFFERENTIAL/PLATELET
Absolute Monocytes: 549 cells/uL (ref 200–950)
Basophils Absolute: 27 cells/uL (ref 0–200)
Basophils Relative: 0.4 %
Eosinophils Absolute: 80 cells/uL (ref 15–500)
Eosinophils Relative: 1.2 %
HCT: 42.6 % (ref 35.0–45.0)
Hemoglobin: 13.8 g/dL (ref 11.7–15.5)
Lymphs Abs: 1782 cells/uL (ref 850–3900)
MCH: 32.5 pg (ref 27.0–33.0)
MCHC: 32.4 g/dL (ref 32.0–36.0)
MCV: 100.5 fL — ABNORMAL HIGH (ref 80.0–100.0)
MPV: 11.2 fL (ref 7.5–12.5)
Monocytes Relative: 8.2 %
Neutro Abs: 4261 cells/uL (ref 1500–7800)
Neutrophils Relative %: 63.6 %
Platelets: 248 10*3/uL (ref 140–400)
RBC: 4.24 10*6/uL (ref 3.80–5.10)
RDW: 13.5 % (ref 11.0–15.0)
Total Lymphocyte: 26.6 %
WBC: 6.7 10*3/uL (ref 3.8–10.8)

## 2020-09-08 LAB — VITAMIN B12: Vitamin B-12: 233 pg/mL (ref 200–1100)

## 2020-09-08 LAB — URINE CULTURE
MICRO NUMBER:: 12121222
SPECIMEN QUALITY:: ADEQUATE

## 2020-09-08 LAB — COPPER, SERUM: Copper: 128 ug/dL (ref 70–175)

## 2020-09-08 LAB — IRON,TIBC AND FERRITIN PANEL
%SAT: 37 % (calc) (ref 16–45)
Ferritin: 69 ng/mL (ref 16–232)
Iron: 120 ug/dL (ref 45–160)
TIBC: 322 mcg/dL (calc) (ref 250–450)

## 2020-09-08 LAB — HEMOGLOBIN A1C
Hgb A1c MFr Bld: 5 %{Hb}
Mean Plasma Glucose: 97 mg/dL
eAG (mmol/L): 5.4 mmol/L

## 2020-09-08 LAB — C-REACTIVE PROTEIN: CRP: 3.8 mg/L (ref <8.0)

## 2020-09-08 LAB — TSH+FREE T4: TSH W/REFLEX TO FT4: 4.72 mIU/L — ABNORMAL HIGH

## 2020-09-08 LAB — SEDIMENTATION RATE: Sed Rate: 9 mm/h (ref 0–30)

## 2020-09-10 ENCOUNTER — Other Ambulatory Visit: Payer: 59

## 2020-09-10 ENCOUNTER — Ambulatory Visit (INDEPENDENT_AMBULATORY_CARE_PROVIDER_SITE_OTHER): Payer: 59

## 2020-09-10 ENCOUNTER — Other Ambulatory Visit: Payer: Self-pay

## 2020-09-10 DIAGNOSIS — E063 Autoimmune thyroiditis: Secondary | ICD-10-CM | POA: Diagnosis not present

## 2020-09-10 DIAGNOSIS — E041 Nontoxic single thyroid nodule: Secondary | ICD-10-CM

## 2020-09-10 DIAGNOSIS — E038 Other specified hypothyroidism: Secondary | ICD-10-CM | POA: Diagnosis not present

## 2020-09-14 ENCOUNTER — Encounter: Payer: Self-pay | Admitting: Family Medicine

## 2020-09-14 ENCOUNTER — Other Ambulatory Visit: Payer: Self-pay | Admitting: Family Medicine

## 2020-09-14 DIAGNOSIS — E063 Autoimmune thyroiditis: Secondary | ICD-10-CM

## 2020-09-14 DIAGNOSIS — E038 Other specified hypothyroidism: Secondary | ICD-10-CM

## 2020-09-14 MED ORDER — NITROFURANTOIN MONOHYD MACRO 100 MG PO CAPS: 100.0000 mg | ORAL_CAPSULE | Freq: Two times a day (BID) | ORAL | 0 refills | Status: AC

## 2020-09-14 MED ORDER — LEVOTHYROXINE SODIUM 88 MCG PO TABS: 88.0000 ug | ORAL_TABLET | Freq: Every day | ORAL | 3 refills | Status: DC

## 2020-09-17 ENCOUNTER — Encounter: Payer: Self-pay | Admitting: Hematology and Oncology

## 2020-09-18 ENCOUNTER — Other Ambulatory Visit: Payer: Self-pay | Admitting: Hematology and Oncology

## 2020-09-18 MED ORDER — CYANOCOBALAMIN 1000 MCG/ML IJ SOLN: 1000.0000 ug | INTRAMUSCULAR | 11 refills | Status: DC

## 2020-09-28 ENCOUNTER — Encounter: Payer: Self-pay | Admitting: Family Medicine

## 2020-09-28 DIAGNOSIS — Z9884 Bariatric surgery status: Secondary | ICD-10-CM

## 2020-09-28 DIAGNOSIS — J3089 Other allergic rhinitis: Secondary | ICD-10-CM

## 2020-09-28 DIAGNOSIS — F5104 Psychophysiologic insomnia: Secondary | ICD-10-CM

## 2020-09-28 DIAGNOSIS — Z79899 Other long term (current) drug therapy: Secondary | ICD-10-CM

## 2020-09-28 DIAGNOSIS — M170 Bilateral primary osteoarthritis of knee: Secondary | ICD-10-CM

## 2020-09-28 DIAGNOSIS — K219 Gastro-esophageal reflux disease without esophagitis: Secondary | ICD-10-CM

## 2020-09-28 DIAGNOSIS — F419 Anxiety disorder, unspecified: Secondary | ICD-10-CM

## 2020-09-28 MED ORDER — OMEPRAZOLE 20 MG PO CPDR: 20.0000 mg | DELAYED_RELEASE_CAPSULE | Freq: Every day | ORAL | 1 refills | Status: DC | PRN

## 2020-09-28 MED ORDER — MONTELUKAST SODIUM 10 MG PO TABS: 10.0000 mg | ORAL_TABLET | Freq: Every day | ORAL | 1 refills | Status: DC

## 2020-09-28 MED ORDER — MELOXICAM 15 MG PO TABS: 15.0000 mg | ORAL_TABLET | Freq: Every day | ORAL | 1 refills | Status: DC | PRN

## 2020-09-28 MED ORDER — CITALOPRAM HYDROBROMIDE 40 MG PO TABS: 40.0000 mg | ORAL_TABLET | Freq: Every day | ORAL | 1 refills | Status: DC

## 2020-09-28 MED ORDER — TRAZODONE HCL 50 MG PO TABS: 50.0000 mg | ORAL_TABLET | Freq: Every evening | ORAL | 1 refills | Status: DC | PRN

## 2020-09-28 MED ORDER — LORAZEPAM 1 MG PO TABS: 1.0000 mg | ORAL_TABLET | Freq: Every day | ORAL | 1 refills | Status: DC | PRN

## 2020-09-28 MED ORDER — ALBUTEROL SULFATE HFA 108 (90 BASE) MCG/ACT IN AERS: 2.0000 | INHALATION_SPRAY | Freq: Four times a day (QID) | RESPIRATORY_TRACT | 1 refills | Status: DC | PRN

## 2020-10-01 ENCOUNTER — Other Ambulatory Visit: Payer: Self-pay

## 2020-10-01 MED ORDER — ALBUTEROL SULFATE HFA 108 (90 BASE) MCG/ACT IN AERS: 2.0000 | INHALATION_SPRAY | Freq: Four times a day (QID) | RESPIRATORY_TRACT | 1 refills | Status: DC | PRN

## 2020-10-13 ENCOUNTER — Other Ambulatory Visit: Payer: Self-pay | Admitting: Family Medicine

## 2020-10-13 MED ORDER — ALBUTEROL SULFATE HFA 108 (90 BASE) MCG/ACT IN AERS: 2.0000 | INHALATION_SPRAY | Freq: Four times a day (QID) | RESPIRATORY_TRACT | 2 refills | Status: AC | PRN

## 2020-10-13 NOTE — Telephone Encounter (Signed)
Rx updated to Proventil HFA, sent to [facility].

## 2020-10-22 ENCOUNTER — Ambulatory Visit: Payer: 59 | Admitting: Family Medicine

## 2020-10-22 ENCOUNTER — Other Ambulatory Visit: Payer: Self-pay

## 2020-10-22 ENCOUNTER — Encounter: Payer: Self-pay | Admitting: Family Medicine

## 2020-10-22 VITALS — BP 90/63 | HR 53 | Ht 66.0 in | Wt >= 6400 oz

## 2020-10-22 DIAGNOSIS — R1312 Dysphagia, oropharyngeal phase: Secondary | ICD-10-CM

## 2020-10-22 DIAGNOSIS — Z23 Encounter for immunization: Secondary | ICD-10-CM

## 2020-10-22 DIAGNOSIS — H579 Unspecified disorder of eye and adnexa: Secondary | ICD-10-CM | POA: Diagnosis not present

## 2020-10-22 MED ORDER — BENZONATATE 200 MG PO CAPS: 200.0000 mg | ORAL_CAPSULE | Freq: Two times a day (BID) | ORAL | 0 refills | Status: DC | PRN

## 2020-10-22 NOTE — Assessment & Plan Note (Signed)
And she does have normal sensation of her face as well as eye area.  She has noted some intermittent blurred vision as well.  She does have history of migraines and this may be related to this.  Referral placed to neurology.

## 2020-10-22 NOTE — Progress Notes (Signed)
Kristy Sharp - 55 y.o. female MRN [id number]  Date of birth: 02-Jul-1965  Subjective Chief Complaint  Patient presents with   Sore Throat    HPI Kristy Sharp is a 55 year old female here today for follow-up of sensation of throat tightness.  We will adjust her thyroid medication to see if this made any difference in her symptoms.  She reports that symptoms have continued to worsen.  She describes feeling of having to clear her throat frequently and sensation of food getting stuck when trying to swallow.  She has seen ENT and was told there was evidence of mild reflux but was otherwise normal.  She denies significant pain associated with this.  She also describes sensation of strange sensation along the right side of her face as well as blurred vision in her right eye.  Describes sensation in her face is feeling of fullness this occurs intermittently.  She denies any eye pain.  ROS:  A comprehensive ROS was completed and negative except as noted per HPI  No Known Allergies  Past Medical History:  Diagnosis Date   Allergic rhinitis due to other allergen    Allergy    Anemia    Anxiety    Asthma    B12 deficiency 04/27/2016   Bursitis, trochanteric    Cellulitis    Depression    Headache    Hypercholesteremia    Hyperglycemia    Hypothyroidism due to Hashimoto's thyroiditis 07/04/2017   Insomnia    Iron deficiency anemia 04/27/2016   Morbid obesity ([place]) 07/04/2017   Obesity    Right lumbar radiculopathy 05/02/2013   Sciatica    Sinusitis    Sleep apnea    Vitamin B deficiency    Vitamin D deficiency     Past Surgical History:  Procedure Laterality Date   CHOLECYSTECTOMY N/A 04/27/2012   Procedure: LAPAROSCOPIC CHOLECYSTECTOMY;  Surgeon: Donato Heinz, MD;  Location: [facility];  Service: General;  Laterality: N/A;   CHOLECYSTECTOMY     EYE SURGERY     GASTRIC BYPASS     ORTHOPEDIC SURGERY     REFRACTIVE SURGERY     WRIST SURGERY Left     Social History   Socioeconomic History    Marital status: Single    Spouse name: Not on file   Number of children: 0   Years of education: college   Highest education level: Not on file  Occupational History   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   Drug use: No   Sexual activity: Not Currently    Birth control/protection: Abstinence  Other Topics Concern   Not on file  Social History Narrative   Patient is single   Patient is right-handed.   Patient works full time at a call center    Education some college   Caffeine coffee two cups daily and some times one soda            Social Determinants of Health   Financial Resource Strain: Not on file  Food Insecurity: Not on file  Transportation Needs: Not on file  Physical Activity: Not on file  Stress: Not on file  Social Connections: Not on file  Family History  Problem Relation Age of Onset   Lupus Mother    Heart Problems Mother    Gout Mother    Arrhythmia Mother    Lung cancer Father    Colon cancer Maternal Grandmother    Cancer - Other Sister     Health Maintenance  Topic Date Due   Pneumococcal Vaccine 60-16 Years old (1 - PCV) Never done   HIV Screening  Never done   Hepatitis C Screening  Never done   PAP SMEAR-Modifier  Never done   COLONOSCOPY (Pts 45-59yr Insurance coverage will need to be confirmed)  Never done   MAMMOGRAM  03/02/2018   COVID-19 Vaccine (4 - Booster for [place]) 05/05/2020   TETANUS/TDAP  12/03/2023   INFLUENZA VACCINE  Completed   Zoster Vaccines- Shingrix  Completed   HPV VACCINES  Aged Out      ----------------------------------------------------------------------------------------------------------------------------------------------------------------------------------------------------------------- Physical Exam BP 90/63 (BP Location: Left Wrist, Patient Position: Sitting, Cuff Size: Normal)   Pulse (!) 53   Ht '5\' 6"'$  (1.676 m)   Wt (!) 402 lb (182.3 kg)   SpO2 99%   BMI 64.88 kg/m   Physical Exam Constitutional:      Appearance: She is well-developed.  HENT:     Head: Normocephalic and atraumatic.  Eyes:     General: No scleral icterus. Cardiovascular:     Rate and Rhythm: Normal rate and regular rhythm.  Musculoskeletal:     Cervical back: Neck supple.  Neurological:     Mental Status: She is alert.    ------------------------------------------------------------------------------------------------------------------------------------------------------------------------------------------------------------------- Assessment and Plan  Abnormal sensation of eye And she does have normal sensation of her face as well as eye area.  She has noted some intermittent blurred vision as well.  She does have history of migraines and this may be related to this.  Referral placed to neurology.  Oropharyngeal dysphagia We discussed that symptoms may be related to reflux.  I placed referral to GI for further evaluation.   Meds ordered this encounter  Medications   benzonatate (TESSALON) 200 MG capsule    Sig: Take 1 capsule (200 mg total) by mouth 2 (two) times daily as needed for cough.    Dispense:  20 capsule    Refill:  0    No follow-ups on file.    This visit occurred during the SARS-CoV-2 public health emergency.  Safety protocols were in place, including screening questions prior to the visit, additional usage of staff PPE, and extensive cleaning of exam room while observing appropriate contact time as indicated for disinfecting solutions.

## 2020-10-22 NOTE — Assessment & Plan Note (Signed)
We discussed that symptoms may be related to reflux.  I placed referral to GI for further evaluation.

## 2020-10-29 ENCOUNTER — Encounter: Payer: Self-pay | Admitting: Hematology and Oncology

## 2020-10-29 ENCOUNTER — Telehealth: Payer: Self-pay | Admitting: Neurology

## 2020-10-29 NOTE — Telephone Encounter (Signed)
Patient is requesting a provider switch from Dr. Krista Blue to Dr. Jaynee Eagles. Patient is being referred for abnormal sensation of face/eye and blurred vision with a history of migraines. Please advise if this is appropriate. Thank you

## 2020-10-29 NOTE — Telephone Encounter (Signed)
It is Ok to switch 

## 2020-11-06 IMAGING — DX DG KNEE COMPLETE 4+V*R*
4 series · 4 of 4 positions shown · non-contrast
Comparison: 04/05/2013

CLINICAL DATA: Medial and posterior knee pain for 3 weeks. No known
injury.

EXAM:
RIGHT KNEE - COMPLETE 4+ VIEW

[knee ap]
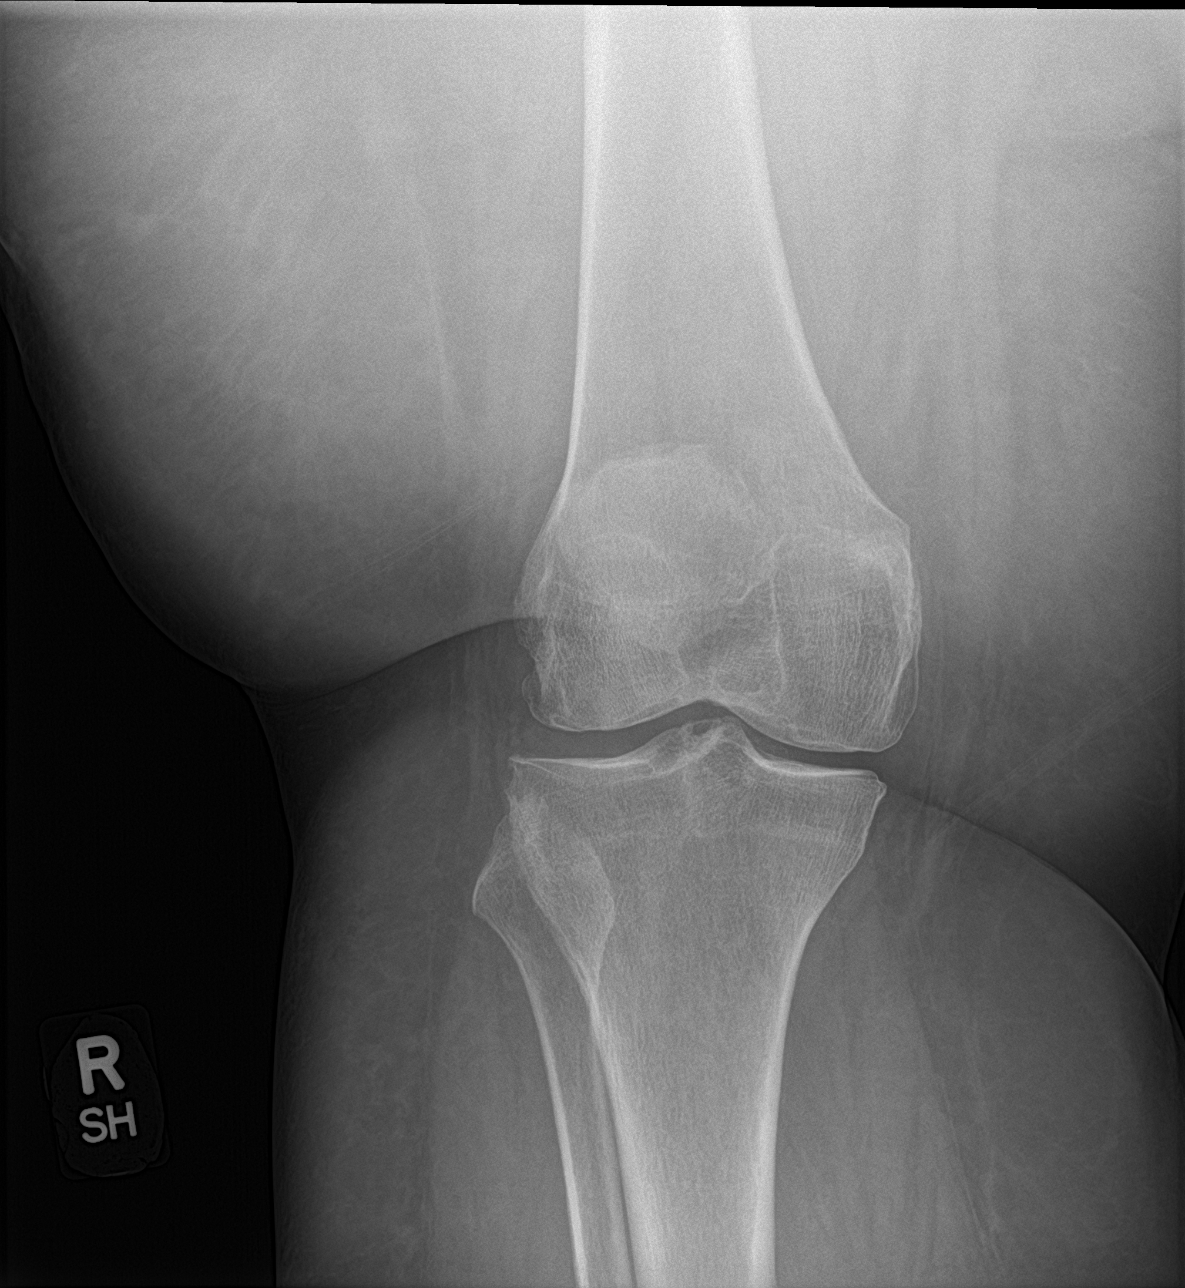

[knee lat]
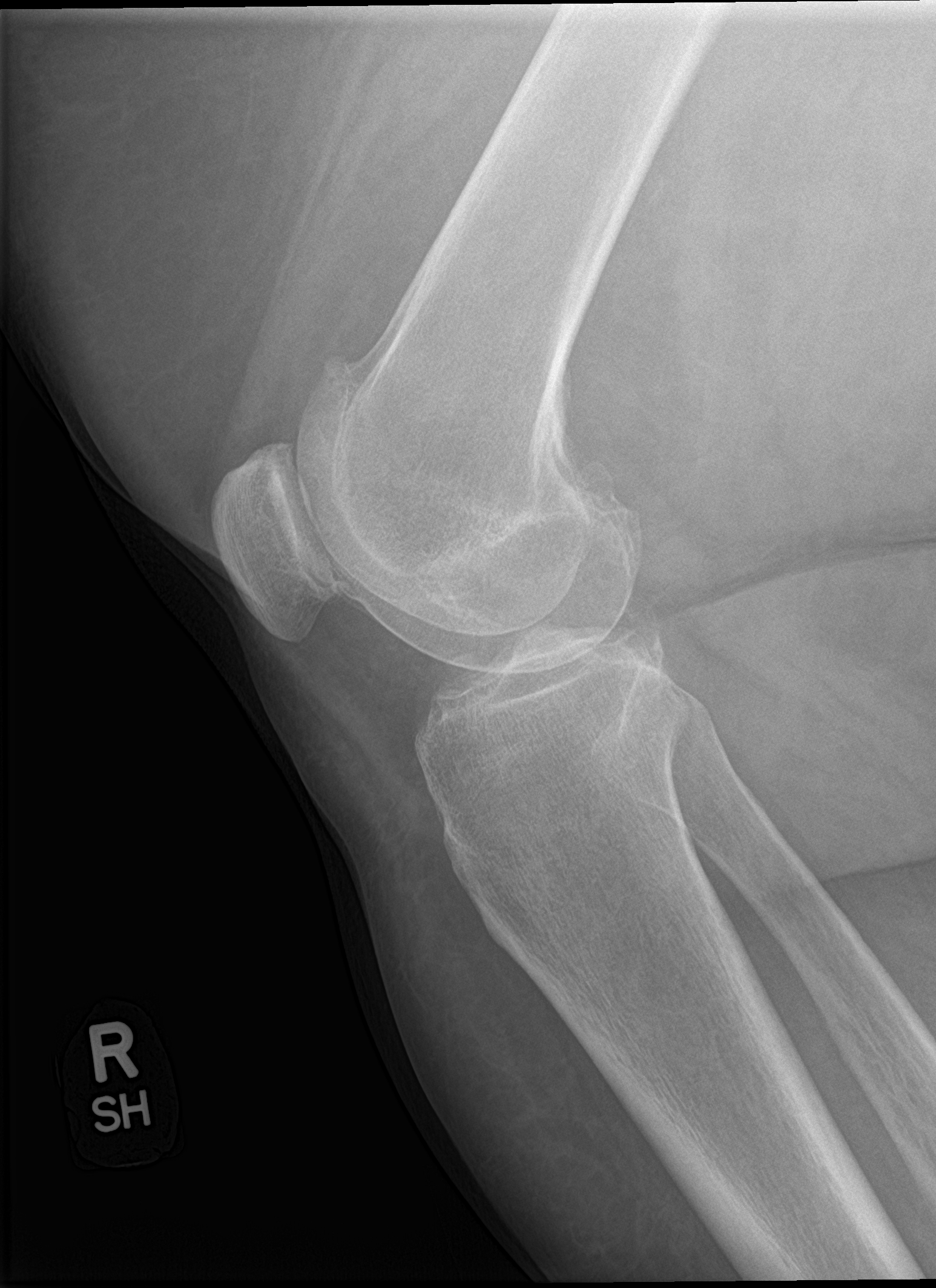

[knee obl (1 of 2)]
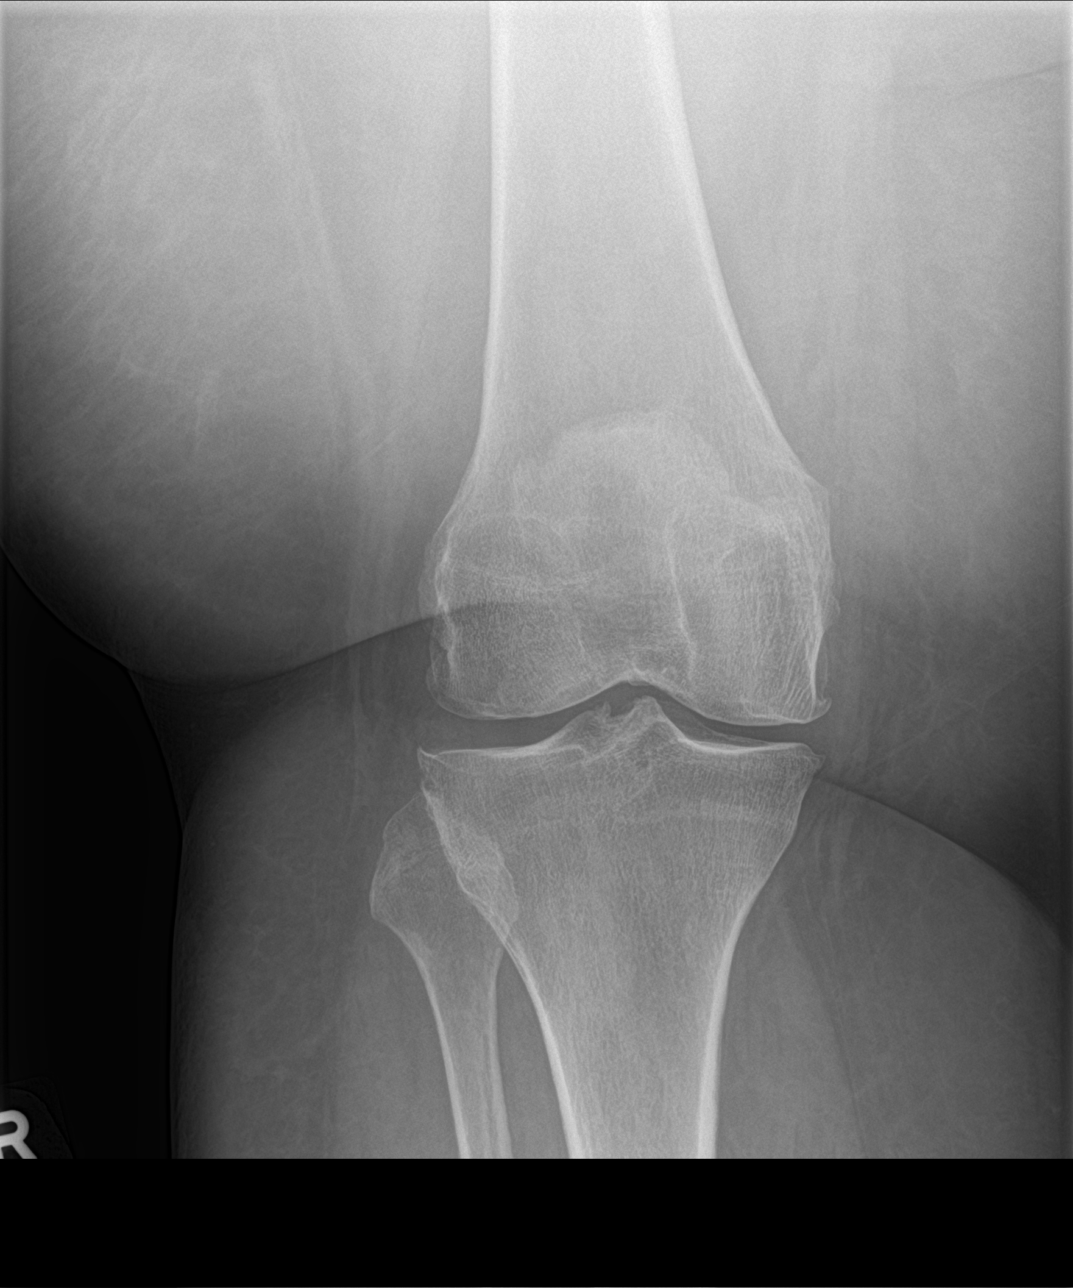

[knee obl (2 of 2)]
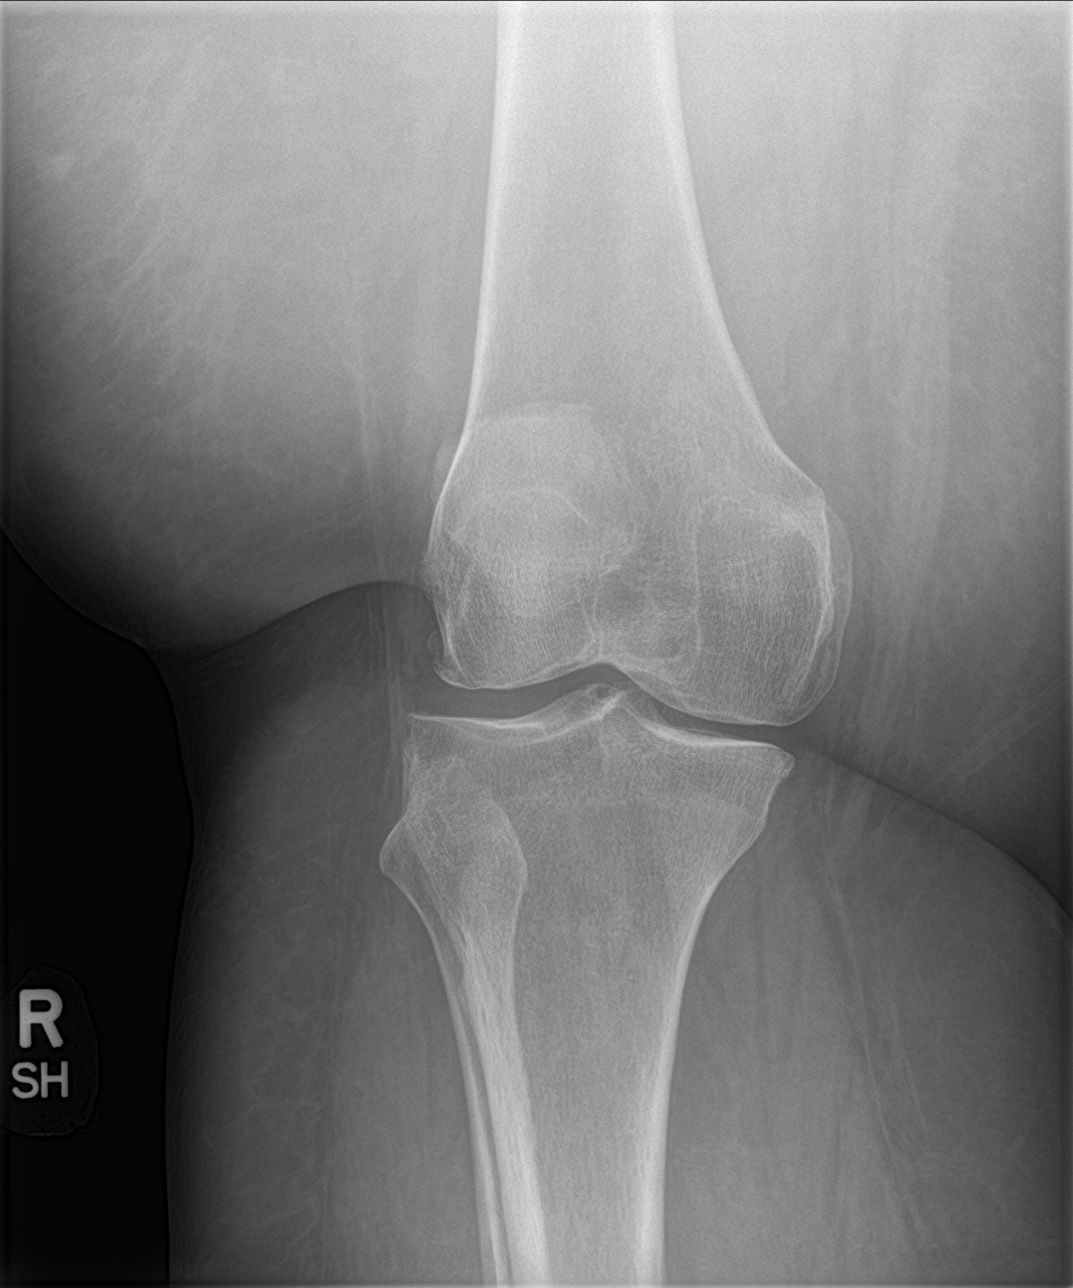

[4 of 4 positions shown; findings below may reference images not displayed]

FINDINGS: No evidence of fracture, dislocation, or joint effusion. There has
been mild progression in tricompartmental degenerative spurring
since previous study, without significant joint space narrowing. No
other bone lesions identified. Soft tissues are unremarkable.
IMPRESSION: 1. No acute findings.
2. Interval progression of mild tricompartmental osteoarthritis.

## 2020-11-20 ENCOUNTER — Other Ambulatory Visit: Payer: Self-pay

## 2020-11-20 ENCOUNTER — Encounter: Payer: Self-pay | Admitting: Gastroenterology

## 2020-11-20 ENCOUNTER — Ambulatory Visit: Payer: 59 | Admitting: Gastroenterology

## 2020-11-20 VITALS — BP 96/64 | HR 76 | Ht 66.0 in | Wt >= 6400 oz

## 2020-11-20 DIAGNOSIS — R0989 Other specified symptoms and signs involving the circulatory and respiratory systems: Secondary | ICD-10-CM

## 2020-11-20 DIAGNOSIS — R1319 Other dysphagia: Secondary | ICD-10-CM

## 2020-11-20 DIAGNOSIS — R059 Cough, unspecified: Secondary | ICD-10-CM

## 2020-11-20 DIAGNOSIS — K219 Gastro-esophageal reflux disease without esophagitis: Secondary | ICD-10-CM

## 2020-11-20 DIAGNOSIS — R198 Other specified symptoms and signs involving the digestive system and abdomen: Secondary | ICD-10-CM

## 2020-11-20 DIAGNOSIS — Z9884 Bariatric surgery status: Secondary | ICD-10-CM

## 2020-11-20 MED ORDER — OMEPRAZOLE 20 MG PO CPDR
20.0000 mg | DELAYED_RELEASE_CAPSULE | Freq: Two times a day (BID) | ORAL | 1 refills | Status: DC
Start: 2020-11-20 — End: 2021-01-27

## 2020-11-20 MED ORDER — PLENVU 140 G PO SOLR: 140.0000 g | ORAL | 0 refills | Status: DC

## 2020-11-20 NOTE — Patient Instructions (Addendum)
If you are age 55 or older, your body mass index should be between 23-30. Your Body mass index is 65.57 kg/m. If this is out of the aforementioned range listed, please consider follow up with your Primary Care Provider.  If you are age 55 or younger, your body mass index should be between 19-25. Your Body mass index is 65.57 kg/m. If this is out of the aformentioned range listed, please consider follow up with your Primary Care Provider.   __________________________________________________________  The [facility] providers would like to encourage you to use [facility] to communicate with providers for non-urgent requests or questions.  Due to long hold times on the telephone, sending your provider a message by [facility] may be a faster and more efficient way to get a response.  Please allow 48 business hours for a response.  Please remember that this is for non-urgent requests.   Increase omeprazole to 40 mg to twice a day  You will be contacted by [place] in the next 2 days to arrange a upper GI series.  The number on your caller ID will be [phone], please answer when they call.  If you have not heard from them in 2 days please call [phone] to schedule.     An upper GI series uses x rays to help diagnose problems of the upper GI tract, which includes the esophagus, stomach, and duodenum. The duodenum is the first part of the small intestine. An upper GI series is conducted by a radiology technologist or a radiologist--a doctor who specializes in x-ray imaging--at a hospital or outpatient center. While sitting or standing in front of an x-ray machine, the patient drinks barium liquid, which is often white and has a chalky consistency and taste. The barium liquid coats the lining of the upper GI tract and makes signs of disease show up more clearly on x rays. X-ray video, called fluoroscopy, is used to view the barium liquid moving through the esophagus, stomach, and  duodenum. Additional x rays and fluoroscopy are performed while the patient lies on an x-ray table. To fully coat the upper GI tract with barium liquid, the technologist or radiologist may press on the abdomen or ask the patient to change position. Patients hold still in various positions, allowing the technologist or radiologist to take x rays of the upper GI tract at different angles. If a technologist conducts the upper GI series, a radiologist will later examine the images to look for problems.  This test typically takes about 1 hour to complete. __________________________________________________________________       COLONOSCOPY:   You have been scheduled for a colonoscopy. Please follow written instructions given to you at your visit today.   PREP:   Please pick up your prep supplies at the pharmacy within the next 1-3 days.  INHALERS:   If you use inhalers (even only as needed), please bring them with you on the day of your procedure.  COLONOSCOPY TIPS:  To reduce nausea and dehydration, stay well hydrated for 3-4 days prior to the exam.  To prevent skin/hemorrhoid irritation - prior to wiping, put A&Dointment or vaseline on the toilet paper. Keep a towel or pad on the bed.  BEFORE STARTING YOUR PREP, drink  64oz of clear liquids in the morning. This will help to flush the colon and will ensure you are well hydrated!!!!  NOTE - This is in addition to the fluids required for to complete your prep. Use of a flavored hard  candy, such as grape Anise Salvo, can counteract some of the flavor of the prep and may prevent some nausea.

## 2020-11-20 NOTE — Addendum Note (Signed)
Addended by: Elias Else on: 11/20/2020 11:31 AM   Modules accepted: Orders, SmartSet

## 2020-11-20 NOTE — Progress Notes (Addendum)
Referring Provider: Luetta Nutting, DO Primary Care Physician:  Luetta Nutting, DO   Reason for Consultation: Oralpharyngeal dysphagia   IMPRESSION:  Dysphagia-unclear if oral pharyngeal or esophageal Globus Cough/choking Laryngopharyngeal reflux on previous evaluation with ENT History of colon polyps    - 3 adenomas on colonoscopy 2015 in [place], [place] Gastric Bypass 2004 in [place] BMI 65.57 Family history of colon cancer (maternal grandmother in her late 53s) Family history of colon polyps (mother)  Globus, cough, and choking may be reflect known LPR.  EGD recommended to evaluate for gastroesophageal reflux, gastric inlet pouch in the proximal esophagus, Zenker's diverticulum, complications of gastric bypass and eosinophilic esophagitis as concurrent causes of her symptoms.  If upper endoscopy is negative would consider esophageal manometry, esophageal impedance, and pH testing to exclude esophageal motility disorders.  Increase PPI therapy to BID. Consider amitriptyline 25 mg daily if no response to PPI.   Surveillance colonoscopy recommended given her history of polyps. Will obtain prior colonoscopy report.   PLAN: - Increase omeprazole to 40 mg BID - GERD reflux lifestyle modifications brochure - Obtain colonoscopy and path report from [place] around 2015 - UGI for evaluation of possible Zenker's diverticulum and complications of gastric bypass - EGD with biopsies to be performed at the hospital - Surveillance colonoscopy to be performed at the hospital   HPI: Kristy Sharp is a 55 y.o. female referred by Dr. Zigmund Daniel for further evaluation of oral pharyngeal dysphagia.  The history is obtained through the patient and review of her electronic health record.  She has anxiety, asthma, Hashimoto's thyroiditis, hypothyroidism, obesity, gastric bypass 2004 and cholecystectomy.  She was previously seen by [facility] 01/07/2019 for reflux,  constipation, and a history of colon polyps. Although she does not remembering seeing the [facility] team in 2020. 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   She has longstanding sensation of throat tightness, throat clearing, globus, and dysphagia. Described as something "constricting the throat." Associated breathing difficulty but she didn't feel that it was related to her breathing. When symptoms are most severe, she has fullness in her neck. Reflux symptoms when she eats late at night although she denies heartburn. No lateralization of the symptoms, dysphagia, odynophagia, weight loss, a change in voice, presence of a neck or tonsillar mass, or unexplained cervical adenopathy.  Symptoms persisted and even worsened despite adjustment of her thyroid medications. Now also having headaches.   Evaluation by ENT revealed a diagnosis of mild reflux.  Flexible laryngoscopy performed 04/09/2020 showed moderate edema of the hypopharynx she was treated with omeprazole 40 mg daily with recommendation to increase to twice daily if needed. Most recent visit with ENT from 04/29/20. She did not find that twice daily was better.  She made the diet adjustments as recommended by ENT without a change in her symptoms.  She recently switched to a new primary care provider who decreased her omeprazole to 20 mg daily.  He told her he was concerned about a diverticulum causing her symptoms. She is most concerned about having cancer.   Colonoscopy in [place] in 2015 revealed 3 polyps.  Surveillance recommended in 3 years.  Colon cancer in her maternal grandmother at age 1, colon polyps in her mother.  No other known family history of colon cancer or colon polyps.  Thyroid ultrasound 09/10/2020 showed an enlarged and diffusely heterogeneous thyroid gland.  No nodules.  Past Medical History:  Diagnosis Date   Allergic rhinitis due to other  allergen    Allergy    Anemia    Anxiety    Asthma    B12 deficiency  04/27/2016   Bursitis, trochanteric    Cellulitis    Colon polyps    Depression    Gallstones    Headache    Heart palpitations    Hypercholesteremia    Hyperglycemia    Hypothyroidism due to Hashimoto's thyroiditis 07/04/2017   Insomnia    Iron deficiency anemia 04/27/2016   Morbid obesity ([place]) 07/04/2017   Obesity    Pneumonia    Right lumbar radiculopathy 05/02/2013   Sciatica    Sinusitis    Sleep apnea    CPAP   Vitamin B deficiency    Vitamin D deficiency     Past Surgical History:  Procedure Laterality Date   CHOLECYSTECTOMY N/A 04/27/2012   Procedure: LAPAROSCOPIC CHOLECYSTECTOMY;  Surgeon: Donato Heinz, MD;  Location: [facility];  Service: General;  Laterality: N/A;   EYE MUSCLE SURGERY Left    age 12   GASTRIC BYPASS     REFRACTIVE SURGERY Bilateral    WRIST SURGERY Left     Prior to Admission medications   Medication Sig Start Date End Date Taking? Authorizing Provider  albuterol (PROVENTIL HFA) 108 (90 Base) MCG/ACT inhaler Inhale 2 puffs into the lungs every 6 (six) hours as needed for wheezing or shortness of breath. 10/13/20  Yes Luetta Nutting, DO  benzonatate (TESSALON) 200 MG capsule Take 1 capsule (200 mg total) by mouth 2 (two) times daily as needed for cough. 10/22/20  Yes Luetta Nutting, DO  Cholecalciferol (VITAMIN D PO) Take 2 capsules by mouth at bedtime. 10,000 units per day   Yes [provider]  citalopram (CELEXA) 40 MG tablet Take 1 tablet (40 mg total) by mouth daily. 09/28/20  Yes Luetta Nutting, DO  cyanocobalamin (,VITAMIN B-12,) 1000 MCG/ML injection Inject 1 mL (1,000 mcg total) into the muscle every 30 (thirty) days. 09/18/20  Yes Gorsuch, Ni, MD  diltiazem (CARDIZEM CD) 120 MG 24 hr capsule Take 1 capsule (120 mg total) by mouth daily. 02/24/20 11/20/20 Yes O'Neal, Cassie Freer, MD  FOLIC ACID PO Take 1 tablet by mouth daily.   Yes [provider]  furosemide (LASIX) 20 MG tablet Take 1 tablet (20 mg total) by mouth daily as  needed. Patient taking differently: Take 20 mg by mouth as needed. 03/12/18  Yes Trixie Dredge, PA-C  levothyroxine (SYNTHROID) 88 MCG tablet Take 1 tablet (88 mcg total) by mouth daily before breakfast. 09/14/20  Yes Luetta Nutting, DO  LORazepam (ATIVAN) 1 MG tablet Take 1 tablet (1 mg total) by mouth daily as needed for anxiety. 09/28/20  Yes Luetta Nutting, DO  meloxicam (MOBIC) 15 MG tablet Take 1 tablet (15 mg total) by mouth daily as needed for pain. 09/28/20  Yes Luetta Nutting, DO  montelukast (SINGULAIR) 10 MG tablet Take 1 tablet (10 mg total) by mouth at bedtime. 09/28/20  Yes Luetta Nutting, DO  Multiple Vitamins-Minerals (BARIATRIC FUSION) CHEW 1 chew PO daily 03/12/18  Yes Trixie Dredge, PA-C  omeprazole (PRILOSEC) 20 MG capsule Take 1 capsule (20 mg total) by mouth daily as needed. 09/28/20  Yes Luetta Nutting, DO  traZODone (DESYREL) 50 MG tablet Take 1 tablet (50 mg total) by mouth at bedtime as needed for sleep. 09/28/20  Yes Luetta Nutting, DO  Vitamin A 10000 units TABS Take 0.5 tablets (5,000 Units total) by mouth daily. 03/20/18  Yes Trixie Dredge,  PA-C    Current Outpatient Medications  Medication Sig Dispense Refill   albuterol (PROVENTIL HFA) 108 (90 Base) MCG/ACT inhaler Inhale 2 puffs into the lungs every 6 (six) hours as needed for wheezing or shortness of breath. 20.1 g 2   benzonatate (TESSALON) 200 MG capsule Take 1 capsule (200 mg total) by mouth 2 (two) times daily as needed for cough. 20 capsule 0   Cholecalciferol (VITAMIN D PO) Take 2 capsules by mouth at bedtime. 10,000 units per day     citalopram (CELEXA) 40 MG tablet Take 1 tablet (40 mg total) by mouth daily. 90 tablet 1   cyanocobalamin (,VITAMIN B-12,) 1000 MCG/ML injection Inject 1 mL (1,000 mcg total) into the muscle every 30 (thirty) days. 1 mL 11   diltiazem (CARDIZEM CD) 120 MG 24 hr capsule Take 1 capsule (120 mg total) by mouth daily. 90 capsule 3   FOLIC ACID PO Take  1 tablet by mouth daily.     furosemide (LASIX) 20 MG tablet Take 1 tablet (20 mg total) by mouth daily as needed. (Patient taking differently: Take 20 mg by mouth as needed.) 30 tablet 3   levothyroxine (SYNTHROID) 88 MCG tablet Take 1 tablet (88 mcg total) by mouth daily before breakfast. 30 tablet 3   LORazepam (ATIVAN) 1 MG tablet Take 1 tablet (1 mg total) by mouth daily as needed for anxiety. 90 tablet 1   meloxicam (MOBIC) 15 MG tablet Take 1 tablet (15 mg total) by mouth daily as needed for pain. 90 tablet 1   montelukast (SINGULAIR) 10 MG tablet Take 1 tablet (10 mg total) by mouth at bedtime. 90 tablet 1   Multiple Vitamins-Minerals (BARIATRIC FUSION) CHEW 1 chew PO daily 90 tablet 3   omeprazole (PRILOSEC) 20 MG capsule Take 1 capsule (20 mg total) by mouth daily as needed. 90 capsule 1   traZODone (DESYREL) 50 MG tablet Take 1 tablet (50 mg total) by mouth at bedtime as needed for sleep. 90 tablet 1   Vitamin A 10000 units TABS Take 0.5 tablets (5,000 Units total) by mouth daily. 30 each 0   No current facility-administered medications for this visit.    Allergies as of 11/20/2020   (No Known Allergies)    Family History  Problem Relation Age of Onset   Lupus Mother    Atrial fibrillation Mother    Gout Mother    Colon polyps Mother    Lung cancer Father    Cancer - Other Sister        appendix   Colon cancer Maternal Grandmother 76   Heart attack Maternal Grandmother    Stroke Maternal Grandfather    Hepatitis Paternal Grandfather        blood transfusion    Review of Systems: 12 system ROS is negative except as noted above.   Physical Exam: General:   Alert,  well-nourished, pleasant and cooperative in NAD Head:  Normocephalic and atraumatic. Eyes:  Sclera clear, no icterus.   Conjunctiva pink. Ears:  Normal auditory acuity. Nose:  No deformity, discharge,  or lesions. Mouth:  No deformity or lesions.   Neck:  Supple; no masses or thyromegaly. Lungs:  Clear  throughout to auscultation.   No wheezes. Heart:  Regular rate and rhythm; no murmurs. Abdomen:  Soft,nontender, nondistended, normal bowel sounds, no rebound or guarding. No hepatosplenomegaly.   Rectal:  Deferred  Msk:  Symmetrical. No boney deformities LAD: No inguinal or umbilical LAD Extremities:  No clubbing or edema.  Neurologic:  Alert and  oriented x4;  grossly nonfocal Skin:  Intact without significant lesions or rashes. Psych:  Alert and cooperative. Normal mood and affect.    Shelisa Fern L. Tarri Glenn, MD, MPH 11/20/2020, 10:34 AM

## 2020-12-03 ENCOUNTER — Other Ambulatory Visit: Payer: Self-pay | Admitting: Gastroenterology

## 2020-12-03 ENCOUNTER — Ambulatory Visit (HOSPITAL_COMMUNITY)
Admission: RE | Admit: 2020-12-03 | Discharge: 2020-12-03 | Disposition: A | Discharge status: Home / Self Care | Payer: 59 | Source: Ambulatory Visit | Attending: Gastroenterology | Admitting: Gastroenterology

## 2020-12-03 ENCOUNTER — Other Ambulatory Visit: Payer: Self-pay

## 2020-12-03 DIAGNOSIS — R0989 Other specified symptoms and signs involving the circulatory and respiratory systems: Secondary | ICD-10-CM | POA: Diagnosis present

## 2020-12-03 DIAGNOSIS — R1319 Other dysphagia: Secondary | ICD-10-CM | POA: Insufficient documentation

## 2020-12-03 DIAGNOSIS — R059 Cough, unspecified: Secondary | ICD-10-CM

## 2020-12-16 ENCOUNTER — Telehealth: Payer: Self-pay

## 2020-12-16 NOTE — Telephone Encounter (Signed)
Called and moved her appt to 11/4. She is aware of appt date/time.

## 2020-12-24 ENCOUNTER — Inpatient Hospital Stay: Payer: 59 | Attending: Hematology and Oncology

## 2020-12-24 ENCOUNTER — Other Ambulatory Visit: Payer: Self-pay

## 2020-12-24 DIAGNOSIS — Z9884 Bariatric surgery status: Secondary | ICD-10-CM | POA: Insufficient documentation

## 2020-12-24 DIAGNOSIS — D508 Other iron deficiency anemias: Secondary | ICD-10-CM

## 2020-12-24 DIAGNOSIS — E559 Vitamin D deficiency, unspecified: Secondary | ICD-10-CM | POA: Insufficient documentation

## 2020-12-24 DIAGNOSIS — Z79899 Other long term (current) drug therapy: Secondary | ICD-10-CM | POA: Insufficient documentation

## 2020-12-24 DIAGNOSIS — E538 Deficiency of other specified B group vitamins: Secondary | ICD-10-CM | POA: Insufficient documentation

## 2020-12-24 DIAGNOSIS — D509 Iron deficiency anemia, unspecified: Secondary | ICD-10-CM | POA: Insufficient documentation

## 2020-12-24 LAB — CBC WITH DIFFERENTIAL/PLATELET
Abs Immature Granulocytes: 0.01 10*3/uL (ref 0.00–0.07)
Basophils Absolute: 0 10*3/uL (ref 0.0–0.1)
Basophils Relative: 1 %
Eosinophils Absolute: 0.1 10*3/uL (ref 0.0–0.5)
Eosinophils Relative: 2 %
HCT: 39.3 % (ref 36.0–46.0)
Hemoglobin: 12.9 g/dL (ref 12.0–15.0)
Immature Granulocytes: 0 %
Lymphocytes Relative: 27 %
Lymphs Abs: 1.2 10*3/uL (ref 0.7–4.0)
MCH: 32.5 pg (ref 26.0–34.0)
MCHC: 32.8 g/dL (ref 30.0–36.0)
MCV: 99 fL (ref 80.0–100.0)
Monocytes Absolute: 0.4 10*3/uL (ref 0.1–1.0)
Monocytes Relative: 8 %
Neutro Abs: 2.9 10*3/uL (ref 1.7–7.7)
Neutrophils Relative %: 62 %
Platelets: 208 10*3/uL (ref 150–400)
RBC: 3.97 MIL/uL (ref 3.87–5.11)
RDW: 13.9 % (ref 11.5–15.5)
WBC: 4.6 10*3/uL (ref 4.0–10.5)
nRBC: 0 % (ref 0.0–0.2)

## 2020-12-24 LAB — IRON AND TIBC
Iron: 134 ug/dL (ref 41–142)
Saturation Ratios: 45 % (ref 21–57)
TIBC: 299 ug/dL (ref 236–444)
UIBC: 165 ug/dL (ref 120–384)

## 2020-12-24 LAB — FERRITIN: Ferritin: 55 ng/mL (ref 11–307)

## 2020-12-24 LAB — VITAMIN B12: Vitamin B-12: 109 pg/mL — ABNORMAL LOW (ref 180–914)

## 2020-12-24 LAB — VITAMIN D 25 HYDROXY (VIT D DEFICIENCY, FRACTURES): Vit D, 25-Hydroxy: 30.98 ng/mL (ref 30–100)

## 2020-12-25 ENCOUNTER — Encounter: Payer: Self-pay | Admitting: Hematology and Oncology

## 2020-12-25 ENCOUNTER — Inpatient Hospital Stay: Payer: 59 | Admitting: Hematology and Oncology

## 2020-12-25 DIAGNOSIS — E559 Vitamin D deficiency, unspecified: Secondary | ICD-10-CM

## 2020-12-25 DIAGNOSIS — D508 Other iron deficiency anemias: Secondary | ICD-10-CM | POA: Diagnosis not present

## 2020-12-25 DIAGNOSIS — E538 Deficiency of other specified B group vitamins: Secondary | ICD-10-CM

## 2020-12-25 DIAGNOSIS — Z9884 Bariatric surgery status: Secondary | ICD-10-CM

## 2020-12-25 MED ORDER — CYANOCOBALAMIN 1000 MCG/ML IJ SOLN: INTRAMUSCULAR | 11 refills | Status: AC

## 2020-12-25 NOTE — Assessment & Plan Note (Signed)
She has a history of vitamin D deficiency With aggressive vitamin D supplement, her vitamin D level has improved but she is at risk again I will check vitamin D level in her next visit

## 2020-12-25 NOTE — Assessment & Plan Note (Signed)
She has significant recurrence of vitamin B12 deficiency again I recommend she increase her vitamin B12 injections to weekly for 8 weeks and then go back to monthly I plan to recheck her levels in 3 months

## 2020-12-25 NOTE — Assessment & Plan Note (Signed)
Her iron deficiency anemia has resolved but she is still low on her ferritin level I plan to recheck it again in the future

## 2020-12-25 NOTE — Assessment & Plan Note (Signed)
Due to her history of gastric bypass, she had unfortunately developed multiple mineral deficiencies We discussed the risk and benefit of follow-up here versus with primary care doctor I will set up another blood work and a visit in 3 months and she is in agreement

## 2020-12-25 NOTE — Progress Notes (Signed)
HEMATOLOGY-ONCOLOGY ELECTRONIC VISIT PROGRESS NOTE  Patient Care Team: Luetta Nutting, DO as PCP - General (Family Medicine) Janetta Hora, MD as Consulting Physician (Hematology) Moody Bruins, MD as Consulting Physician (Ophthalmology)  I connected with the patient via telephone conference and verified that I am speaking with the correct person using two identifiers. The patient's location is at home and I am providing care from the [facility] I discussed the limitations, risks, security and privacy concerns of performing an evaluation and management service by e-visits and the availability of in person appointments.  I also discussed with the patient that there may be a patient responsible charge related to this service. The patient expressed understanding and agreed to proceed.   ASSESSMENT & PLAN:  B12 deficiency She has significant recurrence of vitamin B12 deficiency again I recommend she increase her vitamin B12 injections to weekly for 8 weeks and then go back to monthly I plan to recheck her levels in 3 months  Vitamin D deficiency She has a history of vitamin D deficiency With aggressive vitamin D supplement, her vitamin D level has improved but she is at risk again I will check vitamin D level in her next visit  Iron deficiency anemia Her iron deficiency anemia has resolved but she is still low on her ferritin level I plan to recheck it again in the future  H/O gastric bypass Due to her history of gastric bypass, she had unfortunately developed multiple mineral deficiencies We discussed the risk and benefit of follow-up here versus with primary care doctor I will set up another blood work and a visit in 3 months and she is in agreement  Orders Placed This Encounter  Procedures   Vitamin B12    Standing Status:   Future    Standing Expiration Date:   12/25/2021   VITAMIN D 25 Hydroxy (Vit-D Deficiency, Fractures)    Standing Status:   Future     Standing Expiration Date:   12/25/2021   Ferritin    Standing Status:   Future    Standing Expiration Date:   12/25/2021   Iron and TIBC    Standing Status:   Future    Standing Expiration Date:   12/25/2021   CBC with Differential/Platelet    Standing Status:   Future    Standing Expiration Date:   12/25/2021    INTERVAL HISTORY: Please see below for problem oriented charting. The purpose of today's discussion is to review multiple test results Recently, the dose of vitamin B12 injection was increased to twice a month Her energy level is fair No recent bleeding complications  SUMMARY OF ONCOLOGIC HISTORY:  Collin Hendley female is seen here because of history of multiple mineral deficiencies Patient had Roux-en-Y gastric bypass surgery around 2004 She could not remember how much weight she have lost but over the past few years, she has slowly regained her weight back She used to see a different hematologist in a different health system but had lost her job and insurance and subsequently relocated here to [place] She is currently working for a [employer], 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 She had not noticed  any recent bleeding such as epistaxis, hematuria or hematochezia The patient has used NSAID for chronic joint pain. She is not on antiplatelets agents.  She denies any pica and eats a variety of diet. She never donated blood or received blood transfusion She used to take vitamin B12 injections but have stopped; and then resumed She has received multiple doses of IV iron Feraheme in the past few years  REVIEW OF  SYSTEMS:   Constitutional: Denies fevers, chills or abnormal weight loss Eyes: Denies blurriness of vision Ears, nose, mouth, throat, and face: Denies mucositis or sore throat Respiratory: Denies cough, dyspnea or wheezes Cardiovascular: Denies palpitation, chest discomfort Gastrointestinal:  Denies nausea, heartburn or change in bowel habits Skin: Denies abnormal skin rashes Lymphatics: Denies new lymphadenopathy or easy bruising Neurological:Denies numbness, tingling or new weaknesses Behavioral/Psych: Mood is stable, no new changes  Extremities: No lower extremity edema All other systems were reviewed with the patient and are negative.  I have reviewed the past medical history, past surgical history, social history and family history with the patient and they are unchanged from previous note.  ALLERGIES:  has No Known Allergies.  MEDICATIONS:  Current Outpatient Medications  Medication Sig Dispense Refill   albuterol (PROVENTIL HFA) 108 (90 Base) MCG/ACT inhaler Inhale 2 puffs into the lungs every 6 (six) hours as needed for wheezing or shortness of breath. 20.1 g 2   benzonatate (TESSALON) 200 MG capsule Take 1 capsule (200 mg total) by mouth 2 (two) times daily as needed for cough. 20 capsule 0   Cholecalciferol (VITAMIN D PO) Take 2 capsules by mouth at bedtime. 10,000 units per day     citalopram (CELEXA) 40 MG tablet Take 1 tablet (40 mg total) by mouth daily. 90 tablet 1   cyanocobalamin (,VITAMIN B-12,) 1000 MCG/ML injection Take 1 mg injection weekly x 2 months, and then monthly 10 mL 11   diltiazem (CARDIZEM CD) 120 MG 24 hr capsule Take 1 capsule (120 mg total) by mouth daily. 90 capsule 3   FOLIC ACID PO Take 1 tablet by mouth daily.     furosemide (LASIX) 20 MG tablet Take 1 tablet (20 mg total) by mouth daily as needed. (Patient taking differently: Take 20 mg by mouth as needed.) 30 tablet 3   levothyroxine (SYNTHROID) 88 MCG tablet Take 1 tablet (88 mcg total) by mouth  daily before breakfast. 30 tablet 3   LORazepam (ATIVAN) 1 MG tablet Take 1 tablet (1 mg total) by mouth daily as needed for anxiety. 90 tablet 1   meloxicam (MOBIC) 15 MG tablet Take 1 tablet (15 mg total) by mouth daily as needed for pain. 90 tablet 1   montelukast (SINGULAIR) 10 MG tablet Take 1 tablet (10 mg total) by mouth at bedtime. 90 tablet 1   Multiple Vitamins-Minerals (BARIATRIC FUSION) CHEW 1 chew PO daily 90 tablet 3   omeprazole (PRILOSEC) 20 MG capsule Take 1 capsule (20 mg total) by mouth 2 (two) times daily before a meal. 60 capsule 1   PEG-KCl-NaCl-NaSulf-Na Asc-C (PLENVU) 140 g SOLR Take 140 g by mouth as directed. Manufacturer's coupon Universal coupon code:BIN: P2366821; GROUP: [id number]; PCN: [id number]; ID: [id number]; PAY NO MORE $50 1 each 0   traZODone (DESYREL) 50 MG tablet Take 1 tablet (50 mg total) by mouth at bedtime as needed for sleep. 90 tablet 1   Vitamin A 10000 units TABS Take 0.5 tablets (5,000 Units total) by mouth daily. 30 each 0   No current facility-administered  medications for this visit.    PHYSICAL EXAMINATION: ECOG PERFORMANCE STATUS: 1 - Symptomatic but completely ambulatory  LABORATORY DATA:  I have reviewed the data as listed CMP Latest Ref Rng & Units 09/03/2020 03/12/2018 03/12/2018  Glucose 65 - 99 mg/dL 94 - 95  BUN 7 - 25 mg/dL 15 - 15  Creatinine 0.50 - 1.03 mg/dL 0.78 - 0.62  Sodium 135 - 146 mmol/L 142 - 141  Potassium 3.5 - 5.3 mmol/L 4.1 - 4.1  Chloride 98 - 110 mmol/L 108 - 110  CO2 20 - 32 mmol/L 25 - 24  Calcium 8.6 - 10.4 mg/dL 9.2 8.8 8.8  Total Protein 6.1 - 8.1 g/dL 6.6 - 6.0(L)  Total Bilirubin 0.2 - 1.2 mg/dL 0.4 - 0.4  Alkaline Phos 38 - 126 U/L - - -  AST 10 - 35 U/L 16 - 16  ALT 6 - 29 U/L 9 - 8    Lab Results  Component Value Date   WBC 4.6 12/24/2020   HGB 12.9 12/24/2020   HCT 39.3 12/24/2020   MCV 99.0 12/24/2020   PLT 208 12/24/2020   NEUTROABS 2.9 12/24/2020     RADIOGRAPHIC STUDIES: I have personally  reviewed the radiological images as listed and agreed with the findings in the report. DG UGI W DOUBLE CM (HD BA)  Result Date: 12/03/2020 CLINICAL DATA:  Dysphagia.  History of gastric bypass. EXAM: UPPER GI SERIES WITH KUB TECHNIQUE: After obtaining a scout radiograph a routine upper GI series was performed using thin and high density barium. FLUOROSCOPY TIME:  Fluoroscopy Time:  2 minute 24 second Radiation Exposure Index (if provided by the fluoroscopic device): Number of Acquired Spot Images: 14 COMPARISON:  CT abdomen pelvis 05/19/2012 FINDINGS: Pharyngeal phase of swallowing normal.  No aspiration or stricture. Normal esophageal motility. Small hiatal hernia. Moderate to extensive gastroesophageal reflux into the proximal esophagus. Barium tablet passed readily into the stomach without delay. Postop gastric bypass surgery. Gastric pouch with expected postoperative appearance. There is normal emptying into the small bowel. Small bowel nondilated. No filling of the duodenum. Preliminary KUB demonstrates surgical clips right upper quadrant and in the stomach. Normal bowel gas pattern. IMPRESSION: Postop gastric bypass.  Normal gastric emptying Hiatal hernia with moderate to extensive gastroesophageal reflux. Barium tablet passed readily into the stomach. Electronically Signed   By: Franchot Gallo M.D.   On: 12/03/2020 12:15    I discussed the assessment and treatment plan with the patient. The patient was provided an opportunity to ask questions and all were answered. The patient agreed with the plan and demonstrated an understanding of the instructions. The patient was advised to call back or seek an in-person evaluation if the symptoms worsen or if the condition fails to improve as anticipated.    I spent 20 minutes for the appointment reviewing test results, discuss management and coordination of care.  Heath Lark, MD 12/25/2020 1:28 PM

## 2020-12-29 ENCOUNTER — Other Ambulatory Visit: Payer: 59

## 2020-12-30 ENCOUNTER — Encounter: Payer: Self-pay | Admitting: Cardiovascular Disease

## 2020-12-30 ENCOUNTER — Other Ambulatory Visit: Payer: Self-pay

## 2020-12-30 ENCOUNTER — Ambulatory Visit: Payer: 59 | Admitting: Cardiovascular Disease

## 2020-12-30 VITALS — BP 106/64 | HR 71 | Ht 66.0 in | Wt >= 6400 oz

## 2020-12-30 DIAGNOSIS — G4736 Sleep related hypoventilation in conditions classified elsewhere: Secondary | ICD-10-CM

## 2020-12-30 DIAGNOSIS — I4719 Other supraventricular tachycardia: Secondary | ICD-10-CM

## 2020-12-30 DIAGNOSIS — I471 Supraventricular tachycardia: Secondary | ICD-10-CM | POA: Diagnosis not present

## 2020-12-30 DIAGNOSIS — R0683 Snoring: Secondary | ICD-10-CM

## 2020-12-30 DIAGNOSIS — R5383 Other fatigue: Secondary | ICD-10-CM

## 2020-12-30 DIAGNOSIS — R1312 Dysphagia, oropharyngeal phase: Secondary | ICD-10-CM

## 2020-12-30 DIAGNOSIS — G4733 Obstructive sleep apnea (adult) (pediatric): Secondary | ICD-10-CM | POA: Diagnosis not present

## 2020-12-30 DIAGNOSIS — R600 Localized edema: Secondary | ICD-10-CM

## 2020-12-30 NOTE — Progress Notes (Signed)
Cardiology Office Note    Date:  01/17/2021   ID:  Kristy Sharp, DOB 08-24-1965, MRN [id number]  PCP:  Luetta Nutting, DO  Cardiologist:  Shelva Majestic, MD (sleep); Dr. Grayling Congress  New sleep evaluation   History of Present Illness:  Kristy Sharp is a 55 y.o. female who is followed by Dr. Grayling Congress for cardiology care.  She has a history of Roux-en-Y gastric bypass surgery sometime around 2004.  Subsequently, she has had issues with multiple mineral deficiencies including B12, iron, copper as well as vitamin D.  She is followed by Dr. Simeon Craft such for hematology.  She also has had issues with venous insufficiency, atrial tachycardia, and has had recurrent problems of globus, cough and throat choking and is felt to have oral pharyngeal dysphagia.  She has been seen by Dr. Thornton Park of GI.  Due to concerns for obstructive sleep apnea, she underwent a home sleep study on February 25, 2020.  At the time she had symptoms of snoring, fatigue, in addition to her super morbid obesity.  On her home study overall AHI was 18.5/h consistent with moderate sleep apnea.  She had significant oxygen desaturation to a nadir of 78% during sleep and time spent less than 89% was 35.1 minutes.  At the time, with her cardiovascular comorbidities an in lab titration was recommended but if unable to be obtained AutoPap therapy with initial EPR of 3 at 6 to 18 cm of water was recommended.  Apparently, there was significant delay in obtaining machine due to the supply chain issues.  However, on October 15, 2020 she received a ResMed air sense 11 AutoSet unit.  [employer] is her DME company.  A download was obtained from October 10 through December 29, 2020.  Presently she is not meeting compliance standards with usage days at only 17 out of 30 days representing 57% and average usage on days used at 3 hours and 28 minutes.  When used, her AHI is excellent at 2.6 and her 95th percentile pressure is 9.8 with maximum  average pressure 10.6.  She presents for her initial evaluation. An Epworth Sleepiness Scale score was calculated in the office today and this endorsed at 11 shown below, consistent with continued daytime sleepiness.  Epworth Sleepiness Scale: Situation   Chance of Dozing/Sleeping (0 = never , 1 = slight chance , 2 = moderate chance , 3 = high chance )   sitting and reading 2   watching TV 2   sitting inactive in a public place 1   being a passenger in a motor vehicle for an hour or more 2   lying down in the afternoon 2   sitting and talking to someone 1   sitting quietly after lunch (no alcohol) 1   while stopped for a few minutes in traffic as the driver 0   Total Score  12    She denies any bruxism, painful restless legs, hypnagogic hallucinations or cataplectic events.  She presents for initial sleep evaluation with me.   Past Medical History:  Diagnosis Date   Allergic rhinitis due to other allergen    Allergy    Anemia    Anxiety    Asthma    B12 deficiency 04/27/2016   Bursitis, trochanteric    Cellulitis    Colon polyps    Depression    Gallstones    Headache    Heart palpitations    Hypercholesteremia    Hyperglycemia  Hypothyroidism due to Hashimoto's thyroiditis 07/04/2017   Insomnia    Iron deficiency anemia 04/27/2016   Morbid obesity ([place]) 07/04/2017   Obesity    Pneumonia    Right lumbar radiculopathy 05/02/2013   Sciatica    Sinusitis    Sleep apnea    CPAP   Vitamin B deficiency    Vitamin D deficiency     Past Surgical History:  Procedure Laterality Date   BIOPSY  01/12/2021   Procedure: BIOPSY;  Surgeon: Thornton Park, MD;  Location: [facility];  Service: Gastroenterology;;   CHOLECYSTECTOMY N/A 04/27/2012   Procedure: LAPAROSCOPIC CHOLECYSTECTOMY;  Surgeon: Donato Heinz, MD;  Location: [facility];  Service: General;  Laterality: N/A;   COLONOSCOPY WITH PROPOFOL N/A 01/12/2021   Procedure: COLONOSCOPY WITH PROPOFOL;  Surgeon:  Thornton Park, MD;  Location: [facility];  Service: Gastroenterology;  Laterality: N/A;   ESOPHAGOGASTRODUODENOSCOPY (EGD) WITH PROPOFOL N/A 01/12/2021   Procedure: ESOPHAGOGASTRODUODENOSCOPY (EGD) WITH PROPOFOL;  Surgeon: Thornton Park, MD;  Location: [facility];  Service: Gastroenterology;  Laterality: N/A;   EYE MUSCLE SURGERY Left    age 10   GASTRIC BYPASS     HEMOSTASIS CLIP PLACEMENT  01/12/2021   Procedure: HEMOSTASIS CLIP PLACEMENT;  Surgeon: Thornton Park, MD;  Location: [facility];  Service: Gastroenterology;;   POLYPECTOMY  01/12/2021   Procedure: POLYPECTOMY;  Surgeon: Thornton Park, MD;  Location: [facility];  Service: Gastroenterology;;   REFRACTIVE SURGERY Bilateral    WRIST SURGERY Left     Current Medications: Outpatient Medications Prior to Visit  Medication Sig Dispense Refill   albuterol (PROVENTIL HFA) 108 (90 Base) MCG/ACT inhaler Inhale 2 puffs into the lungs every 6 (six) hours as needed for wheezing or shortness of breath. 20.1 g 2   Cholecalciferol (VITAMIN D PO) Take 2 capsules by mouth at bedtime. 10,000 units     citalopram (CELEXA) 40 MG tablet Take 1 tablet (40 mg total) by mouth daily. 90 tablet 1   cyanocobalamin (,VITAMIN B-12,) 1000 MCG/ML injection Take 1 mg injection weekly x 2 months, and then monthly 10 mL 11   folic acid (FOLVITE) 157 MCG tablet Take 400 mcg by mouth daily.     furosemide (LASIX) 20 MG tablet Take 1 tablet (20 mg total) by mouth daily as needed. (Patient taking differently: Take 20 mg by mouth daily as needed for edema.) 30 tablet 3   LORazepam (ATIVAN) 1 MG tablet Take 1 tablet (1 mg total) by mouth daily as needed for anxiety. 90 tablet 1   meloxicam (MOBIC) 15 MG tablet Take 1 tablet (15 mg total) by mouth daily as needed for pain. 90 tablet 1   montelukast (SINGULAIR) 10 MG tablet Take 1 tablet (10 mg total) by mouth at bedtime. 90 tablet 1   Multiple Vitamins-Minerals (BARIATRIC FUSION) CHEW 1 chew PO  daily (Patient taking differently: Chew 1 each by mouth daily. 1 chew PO daily) 90 tablet 3   omeprazole (PRILOSEC) 20 MG capsule Take 1 capsule (20 mg total) by mouth 2 (two) times daily before a meal. 60 capsule 1   PEG-KCl-NaCl-NaSulf-Na Asc-C (PLENVU) 140 g SOLR Take 140 g by mouth as directed. Manufacturer's coupon Universal coupon code:BIN: P2366821; GROUP: [id number]; PCN: [id number]; ID: [id number]; PAY NO MORE $50 1 each 0   traZODone (DESYREL) 50 MG tablet Take 1 tablet (50 mg total) by mouth at bedtime as needed for sleep. 90 tablet 1   Vitamin A 10000 units TABS Take 0.5 tablets (5,000 Units total) by  mouth daily. (Patient taking differently: Take 10,000 Units by mouth daily.) 30 each 0   levothyroxine (SYNTHROID) 88 MCG tablet Take 1 tablet (88 mcg total) by mouth daily before breakfast. 30 tablet 3   benzonatate (TESSALON) 200 MG capsule Take 1 capsule (200 mg total) by mouth 2 (two) times daily as needed for cough. 20 capsule 0   diltiazem (CARDIZEM CD) 120 MG 24 hr capsule Take 1 capsule (120 mg total) by mouth daily. 90 capsule 3   FOLIC ACID PO Take 1 tablet by mouth daily.     No facility-administered medications prior to visit.     Allergies:   Patient has no known allergies.   Social History   Socioeconomic History   Marital status: Single    Spouse name: Not on file   Number of children: 0   Years of education: college   Highest education level: Not on file  Occupational History   Occupation: customer support agent    Comment: [employer]  Tobacco Use   Smoking status: Never    Passive exposure: Never   Smokeless tobacco: Never  Vaping Use   Vaping Use: Never used  Substance and Sexual Activity   Alcohol use: Yes    Alcohol/week: 1.0 standard drink    Types: 1 Cans of beer per week    Comment: occas.   Drug use: No   Sexual activity: Not Currently    Birth control/protection: Abstinence  Other Topics Concern   Not on file  Social History Narrative   Patient is  single   Patient is right-handed.   Patient works full time at a call center    Education some college   Caffeine coffee two cups daily and some times one soda            Social Determinants of Radio broadcast assistant Strain: Not on file  Food Insecurity: Not on file  Transportation Needs: Not on file  Physical Activity: Not on file  Stress: Not on file  Social Connections: Not on file     Family History:  The patient's family history includes Atrial fibrillation in her mother; Cancer - Other in her sister; Colon cancer (age of onset: 25) in her maternal grandmother; Colon polyps in her mother; Gout in her mother; Heart attack in her maternal grandmother; Hepatitis in her paternal grandfather; Lung cancer in her father; Lupus in her mother; Stroke in her maternal grandfather.   ROS General: Negative; No fevers, chills, or night sweats;  HEENT: Negative; No changes in vision or hearing, sinus congestion, difficulty swallowing Pulmonary: Negative; No cough, wheezing, shortness of breath, hemoptysis Cardiovascular: Negative; No chest pain, presyncope, syncope, palpitations GI: Negative; No nausea, vomiting, diarrhea, or abdominal pain GU: Negative; No dysuria, hematuria, or difficulty voiding Musculoskeletal: Negative; no myalgias, joint pain, or weakness Hematologic/Oncology: Negative; no easy bruising, bleeding Endocrine: Negative; no heat/cold intolerance; no diabetes Neuro: Negative; no changes in balance, headaches Skin: Negative; No rashes or skin lesions Psychiatric: Negative; No behavioral problems, depression Sleep: Negative; No snoring, daytime sleepiness, hypersomnolence, bruxism, restless legs, hypnogognic hallucinations, no cataplexy Other comprehensive 14 point system review is negative.   PHYSICAL EXAM:   VS:  BP 106/64   Pulse 71   Ht 5' 6"  (1.676 m)   Wt (!) 406 lb (184.2 kg)   SpO2 95%   BMI 65.53 kg/m     Repeat blood pressure by me was 118/68  Wt  Readings from Last 3 Encounters:  01/12/21 Marland Kitchen)  405 lb (183.7 kg)  01/11/21 (!) 411 lb 6.4 oz (186.6 kg)  12/30/20 (!) 406 lb (184.2 kg)    General: Alert, oriented, no distress.  Super morbid obesity with BMI of 65.5 Skin: normal turgor, no rashes, warm and dry HEENT: Normocephalic, atraumatic. Pupils equal round and reactive to light; sclera anicteric; extraocular muscles intact;  Nose without nasal septal hypertrophy Mouth/Parynx benign; Mallinpatti scale 3 Neck: No JVD, no carotid bruits; normal carotid upstroke Lungs: clear to ausculatation and percussion; no wheezing or rales Chest wall: without tenderness to palpitation Heart: PMI not displaced, RRR, s1 s2 normal, 1/6 systolic murmur, no diastolic murmur, no rubs, gallops, thrills, or heaves Abdomen: Central adiposity; soft, nontender; no hepatosplenomehaly, BS+; abdominal aorta nontender and not dilated by palpation. Back: no CVA tenderness Pulses 2+ Musculoskeletal: full range of motion, normal strength, no joint deformities Extremities: no clubbing cyanosis or edema, Homan's sign negative  Neurologic: grossly nonfocal; Cranial nerves grossly wnl Psychologic: Normal mood and affect   Studies/Labs Reviewed:   EKG:  EKG is ordered today. ECG (independently read by me):  Sinus bradycardia at 78, PVC  Recent Labs: BMP Latest Ref Rng & Units 01/11/2021 09/03/2020 03/12/2018  Glucose 70 - 99 mg/dL 85 94 -  BUN 6 - 24 mg/dL 17 15 -  Creatinine 0.57 - 1.00 mg/dL 0.63 0.78 -  BUN/Creat Ratio 9 - 23 19(Q) NOT APPLICABLE -  Sodium 222 - 144 mmol/L 141 142 -  Potassium 3.5 - 5.2 mmol/L 4.1 4.1 -  Chloride 96 - 106 mmol/L 105 108 -  CO2 20 - 29 mmol/L 24 25 -  Calcium 8.7 - 10.2 mg/dL 9.1 9.2 8.8     Hepatic Function Latest Ref Rng & Units 09/03/2020 03/12/2018 04/11/2017  Total Protein 6.1 - 8.1 g/dL 6.6 6.0(L) 6.6  Albumin 3.5 - 5.0 g/dL - - 3.5  AST 10 - 35 U/L 16 16 19   ALT 6 - 29 U/L 9 8 13(L)  Alk Phosphatase 38 - 126 U/L -  - 49  Total Bilirubin 0.2 - 1.2 mg/dL 0.4 0.4 0.6    CBC Latest Ref Rng & Units 12/24/2020 09/03/2020 07/01/2020  WBC 4.0 - 10.5 K/uL 4.6 6.7 4.9  Hemoglobin 12.0 - 15.0 g/dL 12.9 13.8 13.1  Hematocrit 36.0 - 46.0 % 39.3 42.6 40.1  Platelets 150 - 400 K/uL 208 248 229   Lab Results  Component Value Date   MCV 99.0 12/24/2020   MCV 100.5 (H) 09/03/2020   MCV 99.5 07/01/2020   Lab Results  Component Value Date   TSH 3.20 06/19/2018   Lab Results  Component Value Date   HGBA1C 5.0 09/03/2020     BNP No results found for: BNP  ProBNP No results found for: PROBNP   Lipid Panel  No results found for: CHOL, TRIG, HDL, CHOLHDL, VLDL, LDLCALC, LDLDIRECT, LABVLDL   RADIOLOGY: No results found.   Additional studies/ records that were reviewed today include:   02/25/2020 CLINICAL INFORMATION Sleep Study Type: HST   Indication for sleep study: snoring, fatigue, super morbid obesity   Epworth Sleepiness Score: 4   SLEEP STUDY TECHNIQUE A multi-channel overnight portable sleep study was performed. The channels recorded were: nasal airflow, thoracic respiratory movement, and oxygen saturation with a pulse oximetry. Snoring was also monitored.   MEDICATIONS albuterol (PROVENTIL HFA;VENTOLIN HFA) 108 (90 BASE) MCG/ACT inhaler cetirizine (ZYRTEC) 10 MG tablet Cholecalciferol (VITAMIN D PO) citalopram (CELEXA) 40 MG tablet cyanocobalamin (,VITAMIN B-12,) 1000 MCG/ML injection diltiazem (CARDIZEM CD)  120 MG 24 hr capsule FOLIC ACID PO furosemide (LASIX) 20 MG tablet levothyroxine (SYNTHROID) 75 MCG tablet LORazepam (ATIVAN) 1 MG tablet meloxicam (MOBIC) 15 MG tablet montelukast (SINGULAIR) 10 MG tablet Multiple Vitamins-Minerals (BARIATRIC FUSION) CHEW NONFORMULARY OR COMPOUNDED ITEM omeprazole (PRILOSEC) 20 MG capsule traZODone (DESYREL) 50 MG tablet Vitamin A 10000 units TABS Patient self administered medications include: N/A.   SLEEP ARCHITECTURE Patient was  studied for 540.6 minutes. The sleep efficiency was 100.0 % and the patient was supine for 96.5%. The arousal index was 0.0 per hour.   RESPIRATORY PARAMETERS The overall AHI was 18.5 per hour, with a central apnea index of 0.0 per hour.   The oxygen nadir was 78% during sleep. Time spent < 89% was 35.1 minutes   CARDIAC DATA Mean heart rate during sleep was 57.4 bpm.   IMPRESSIONS - Moderate obstructive sleep apnea occurred during this study (AHI 18.5/h); however, the severity during REM sleep cannot be assessed on this home study. - No significant central sleep apnea occurred during this study (CAI = 0.0/h). - Severe oxygen desaturation to a nadir of 78%. - Patient snored 14.1% during the sleep.   DIAGNOSIS - Obstructive Sleep Apnea (G47.33) - Nocturnal Hypoxemia (G47.36)   RECOMMENDATIONS - In this patient with super morbid obesity and cardiovascular co-morbidities recommend an in-lab CPAP titration study. If unable to schedule then suggest initial Auto-PAP with EPR of 3 at 6 - 18  cm of water. - Effort should be made to optimize nasal and oropharyngeal patency. - Avoid alcohol, sedatives and other CNS depressants that may worsen sleep apnea and disrupt normal sleep architecture. - Sleep hygiene should be reviewed to assess factors that may improve sleep quality. - Weight management (BMI 67) and regular exercise should be initiated or continued. - Return to Sleep Center to discuss the results of this study    ASSESSMENT:    1. Obstructive sleep apnea (adult) (pediatric)   2. Snoring   3. Fatigue, unspecified type   4. Atrial tachycardia (HCC)   5. Obesity, morbid, BMI 50 or higher (HCC)   6. Leg edema   7. Sleep-related hypoventilation due to medical condition   8. Oropharyngeal dysphagia     PLAN:  Kristy Sharp is a 55 year old female who has a history of super morbid obesity, venous insufficiency, atrial tachycardia, and has had issues with shortness of breath,  lower extremity edema and palpitations. She underwent a Roux-en-Y gastric bypass surgical procedure in 2004.  She subsequently has had issues with mineral deficiencies including B12, vitamin D, and iron deficiency.  She also has had issues with globus, cough, and a choking sensation in her neck and is felt to have oral pharyngeal dysphagia.  She is in need of further evaluation with esophagogastroduodenoscopy by Dr. Thornton Park.  The patient has had issues highly suggestive of sleep apnea with snoring, frequent awakenings, nonrestorative sleep and was referred for a home sleep study in January 2022.  This confirmed moderate overall sleep apnea, although the severity during REM sleep could not be evaluated on an excellent study.  She did have significant oxygen desaturation to nadir of 78% and spent 35.1 minutes under 89% oxygen saturation.  Due to supply chain issues causing delay, she ultimately received a new ResMed air sense 11 AutoSet CPAP unit on October 15, 2020.  She has tried multiple masks including an initial AirFit F 20, both small and medium size, a DreamWear full facemask, and has had difficulty recently with wearing her  masks.  Her most recent download from October 10 of December 29, 2020 shows poor compliance with only 57 (percent of usage days and usage greater than 4 hours and only 20%.  She was averaging only 3 hours and 28 minutes of CPAP therapy per night.  Although when used AHI is excellent at 2.6 and her 95th percentile pressure was 9.8 cm of water.  She states she typically goes to bed at 10 PM and wakes up at 6:30 AM and as result she should be using CPAP for the night entirety.  I had a lengthy discussion with her today the effects of untreated sleep apnea on normal sleep architecture.  We also discussed its potential adverse cardiovascular consequences including difficulty controlling blood pressure, nocturnal palpitations with increased risk for atrial fibrillation, nocturnal hypoxemia  potentially contributing to nocturnal ischemia both cardiac and cerebrovascular [place] as well as its effects on inflammation, glucose control, and GERD.  I discussed the pathophysiology associated with increased nocturia.  I discussed optimal sleep duration for an adult at 7 to 9 hours.  I also discussed that usually sleep apnea is more severe during REM sleep and then on her particular home study we could not quantify the severity of her sleep apnea during REM sleep.  I discussed that the preponderance of REM sleep occurs in the second half of the night and the importance that she use CPAP throughout the night entirety and not just at the very beginning of the night.  I discussed the importance of meeting compliance standards within 90 days of CPAP initiation.  She is currently very close to potentially having her machine withdrawn by her DME company if she does not meet compliance.  We have provided her with a trial of her Fisher-Paykel Voora fullface mask to see if she would tolerate this compared to her other masks.  We discussed the importance of weight loss and increased exercise.  She will be undergoing further evaluation for oral pharyngeal dysphagia with Dr. Ferdinand Lango.  She will continue to see Dr. Davina Poke for primary cardiology care.  I will see her in 4-6 weeks for follow-up sleep evaluation evaluation at which time hopefully she will meet compliance standards.  Time spent: 45 minutes.  Medication Adjustments/Labs and Tests Ordered: Current medicines are reviewed at length with the patient today.  Concerns regarding medicines are outlined above.  Medication changes, Labs and Tests ordered today are listed in the Patient Instructions below. Patient Instructions  Medication Instructions:  Continue current medications. No changes.  *If you need a refill on your cardiac medications before your next appointment, please call your pharmacy*  Follow-Up: At [facility], you and your health needs are our  priority.  As part of our continuing mission to provide you with exceptional heart care, we have created designated Provider Care Teams.  These Care Teams include your primary Cardiologist (physician) and Advanced Practice Providers (APPs -  Physician Assistants and Nurse Practitioners) who all work together to provide you with the care you need, when you need it.  We recommend signing up for the patient portal called "MyChart".  Sign up information is provided on this After Visit Summary.  MyChart is used to connect with patients for Virtual Visits (Telemedicine).  Patients are able to view lab/test results, encounter notes, upcoming appointments, etc.  Non-urgent messages can be sent to your provider as well.   To learn more about what you can do with MyChart, go to [email or web address].    Your next appointment:  6 week(s)  The format for your next appointment:   In Person  Provider:   Dr. Corky Downs, MD    Signed, Shelva Majestic, MD  01/17/2021 9:48 PM    [facility] [address], [place], [place], [place]  [address] Phone: [phone]

## 2020-12-30 NOTE — Patient Instructions (Signed)
Medication Instructions:  Continue current medications. No changes.  *If you need a refill on your cardiac medications before your next appointment, please call your pharmacy*  Follow-Up: At [facility], you and your health needs are our priority.  As part of our continuing mission to provide you with exceptional heart care, we have created designated Provider Care Teams.  These Care Teams include your primary Cardiologist (physician) and Advanced Practice Providers (APPs -  Physician Assistants and Nurse Practitioners) who all work together to provide you with the care you need, when you need it.  We recommend signing up for the patient portal called "MyChart".  Sign up information is provided on this After Visit Summary.  MyChart is used to connect with patients for Virtual Visits (Telemedicine).  Patients are able to view lab/test results, encounter notes, upcoming appointments, etc.  Non-urgent messages can be sent to your provider as well.   To learn more about what you can do with MyChart, go to [email or web address].    Your next appointment:   6 week(s)  The format for your next appointment:   In Person  Provider:   Dr. Corky Downs, MD

## 2020-12-31 ENCOUNTER — Telehealth: Payer: 59 | Admitting: Hematology and Oncology

## 2021-01-01 ENCOUNTER — Encounter (HOSPITAL_COMMUNITY): Payer: Self-pay | Admitting: Gastroenterology

## 2021-01-01 ENCOUNTER — Other Ambulatory Visit: Payer: Self-pay

## 2021-01-07 ENCOUNTER — Ambulatory Visit: Payer: Self-pay | Admitting: Neurology

## 2021-01-11 ENCOUNTER — Ambulatory Visit: Payer: 59 | Admitting: Neurology

## 2021-01-11 ENCOUNTER — Encounter: Payer: Self-pay | Admitting: Neurology

## 2021-01-11 VITALS — BP 110/80 | HR 60 | Ht 66.0 in | Wt >= 6400 oz

## 2021-01-11 DIAGNOSIS — E509 Vitamin A deficiency, unspecified: Secondary | ICD-10-CM

## 2021-01-11 DIAGNOSIS — E612 Magnesium deficiency: Secondary | ICD-10-CM

## 2021-01-11 DIAGNOSIS — R51 Headache with orthostatic component, not elsewhere classified: Secondary | ICD-10-CM

## 2021-01-11 DIAGNOSIS — R519 Headache, unspecified: Secondary | ICD-10-CM

## 2021-01-11 DIAGNOSIS — H579 Unspecified disorder of eye and adnexa: Secondary | ICD-10-CM | POA: Diagnosis not present

## 2021-01-11 DIAGNOSIS — H539 Unspecified visual disturbance: Secondary | ICD-10-CM

## 2021-01-11 DIAGNOSIS — R413 Other amnesia: Secondary | ICD-10-CM

## 2021-01-11 DIAGNOSIS — G43109 Migraine with aura, not intractable, without status migrainosus: Secondary | ICD-10-CM

## 2021-01-11 NOTE — Progress Notes (Signed)
[facility]    Provider:  Dr Jaynee Eagles Requesting Provider: Luetta Nutting, DO Primary Care Provider:  Luetta Nutting, DO  CC:  multiple neurologic complaints  HPI:  Kristy Sharp is a 55 y.o. female here as requested by Luetta Nutting, DO for abnormal sensation of eye. PMHx b12 def, anemia, anxiety, migraines, b12 def, depression, headache, hyoerglycemia, hypothyroid, obesity, sleep apnea, gastric bypass, edema due to peripheral venous insufficiency, copper and vit d deficiency.  For the last year she has a feeling around her eyes like they are swollen and tight. Her vision has been "weird" she went to the eye doctor and exam ws normal but still feel vision episodes of blurriness she has dry eye, she gets "stars" floating in her eyes and her vision is wavy and happens at work and she stops for 10 minutes and can't see, sometimes followed by a mild headache but most often some visual changes she is fine. When she was younger she used to get migraines a lot esp with chocolate as a trigger, but rare she gets a migraine, last time 6 months, vision changes started off an on for 2 years (maybe aura), not debilitating but she wants to know and it affects her vision she gets it 1x every 2-3 weeks, brief 10 minutes and goes away, never had that with her. migraines. She hit her head about 10 years ago on a car door and had a bump and she gets a stabby pain there (points to the occipital nerve). No ptosis, no facial weakness, no problems swallowing, no coughing, she had an upper Gi series and swallowing was fine, tomorrow having endoscopy. She always has tightness around ger eyes and swelling. Very annoying and bothersome, doesn't significantly impact life but can't drive with the aura. 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 She feels her memory is poor. She follows closely with eye doctor, may be a remote hx of toxoplasmosis but stable scar in the left eye. She treats her sleep apnea, she just got  her machine in August but struggling to find  mask been using it now for only a few weeks. +morning headaches and supine headaches. No other focal neurologic deficits, associated symptoms, inciting events or modifiable factors.  Reviewed notes, labs and imaging from outside physicians, which showed:  12/2020: B12 : 109, D:36, cbc nml 08/2020 T4F normal  CT Maxillofacial: revieweed images and agree  Non-contiguous multidetector CT images of the paranasal sinuses were obtained in a single plane without contrast.   COMPARISON:  None.   FINDINGS: Paranasal sinuses well developed and clear. No significant mucosal edema. No air-fluid level. No acute skeletal abnormality.   IMPRESSION: Negative Review of Systems: Patient complains of symptoms per HPI as well as the following symptoms obesity. Pertinent negatives and positives per HPI. All others negative.   Social History   Socioeconomic History   Marital status: Single    Spouse name: Not on file   Number of children: 0   Years of education: college   Highest education level: Not on file  Occupational History   Occupation: customer support agent    Comment: [employer]  Tobacco Use   Smoking status: Never    Passive exposure: Never   Smokeless tobacco: Never  Vaping Use   Vaping Use: Never used  Substance and Sexual Activity   Alcohol use: Yes    Alcohol/week: 1.0 standard drink    Types: 1 Cans of beer per week    Comment: occas.   Drug  use: No   Sexual activity: Not Currently    Birth control/protection: Abstinence  Other Topics Concern   Not on file  Social History Narrative   Patient is single   Patient is right-handed.   Patient works full time at a call center    Education some college   Caffeine coffee two cups daily and some times one soda            Social Determinants of Health   Financial Resource Strain: Not on file  Food Insecurity: Not on file  Transportation Needs: Not on file  Physical Activity: Not  on file  Stress: Not on file  Social Connections: Not on file  Intimate Partner Violence: Not on file    Family History  Problem Relation Age of Onset   Lupus Mother    Atrial fibrillation Mother    Gout Mother    Colon polyps Mother    Lung cancer Father    Cancer - Other Sister        appendix   Colon cancer Maternal Grandmother 84   Heart attack Maternal Grandmother    Stroke Maternal Grandfather    Hepatitis Paternal Grandfather        blood transfusion    Past Medical History:  Diagnosis Date   Allergic rhinitis due to other allergen    Allergy    Anemia    Anxiety    Asthma    B12 deficiency 04/27/2016   Bursitis, trochanteric    Cellulitis    Colon polyps    Depression    Gallstones    Headache    Heart palpitations    Hypercholesteremia    Hyperglycemia    Hypothyroidism due to Hashimoto's thyroiditis 07/04/2017   Insomnia    Iron deficiency anemia 04/27/2016   Morbid obesity ([place]) 07/04/2017   Obesity    Pneumonia    Right lumbar radiculopathy 05/02/2013   Sciatica    Sinusitis    Sleep apnea    CPAP   Vitamin B deficiency    Vitamin D deficiency     Patient Active Problem List   Diagnosis Date Noted   Migraine with aura and without status migrainosus, not intractable 01/11/2021   Abnormal sensation of eye 10/22/2020   Oropharyngeal dysphagia 10/22/2020   Arthralgia 09/03/2020   Urinary frequency 09/03/2020   Obstructive sleep apnea syndrome, mild 04/20/2020   Laryngopharyngeal reflux 04/09/2020   Acute sinusitis 04/09/2020   Vitamin D deficiency 07/08/2019   Copper deficiency 07/08/2019   Chronic right-sided headache 03/12/2018   Vitamin A deficiency 03/12/2018   Edema of both lower extremities due to peripheral venous insufficiency 03/12/2018   Gastroesophageal reflux disease 03/12/2018   Nontraumatic pain of right shoulder 03/08/2018   Right knee pain 03/08/2018   Chronic insomnia 03/08/2018   Chronic use of benzodiazepine for  therapeutic purpose 03/08/2018   Chronic anxiety 03/08/2018   History of colon polyps 02/28/2018   Aura 02/28/2018   Hypothyroidism due to Hashimoto's thyroiditis 07/04/2017   Obesity, Class III, BMI 40-49.9 (morbid obesity) ([place]) 07/04/2017   H/O gastric bypass 04/27/2016   B12 deficiency 04/27/2016   Iron deficiency anemia 04/27/2016    Past Surgical History:  Procedure Laterality Date   CHOLECYSTECTOMY N/A 04/27/2012   Procedure: LAPAROSCOPIC CHOLECYSTECTOMY;  Surgeon: Donato Heinz, MD;  Location: [facility];  Service: General;  Laterality: N/A;   EYE MUSCLE SURGERY Left    age 19   GASTRIC BYPASS     REFRACTIVE SURGERY  Bilateral    WRIST SURGERY Left     Current Outpatient Medications  Medication Sig Dispense Refill   albuterol (PROVENTIL HFA) 108 (90 Base) MCG/ACT inhaler Inhale 2 puffs into the lungs every 6 (six) hours as needed for wheezing or shortness of breath. 20.1 g 2   Cholecalciferol (VITAMIN D PO) Take 2 capsules by mouth at bedtime. 10,000 units     citalopram (CELEXA) 40 MG tablet Take 1 tablet (40 mg total) by mouth daily. 90 tablet 1   cyanocobalamin (,VITAMIN B-12,) 1000 MCG/ML injection Take 1 mg injection weekly x 2 months, and then monthly 10 mL 11   diltiazem (CARDIZEM CD) 120 MG 24 hr capsule Take 120 mg by mouth daily.     folic acid (FOLVITE) 809 MCG tablet Take 400 mcg by mouth daily.     furosemide (LASIX) 20 MG tablet Take 1 tablet (20 mg total) by mouth daily as needed. (Patient taking differently: Take 20 mg by mouth daily as needed for edema.) 30 tablet 3   levothyroxine (SYNTHROID) 75 MCG tablet Take 75 mcg by mouth daily before breakfast.     LORazepam (ATIVAN) 1 MG tablet Take 1 tablet (1 mg total) by mouth daily as needed for anxiety. 90 tablet 1   meloxicam (MOBIC) 15 MG tablet Take 1 tablet (15 mg total) by mouth daily as needed for pain. 90 tablet 1   montelukast (SINGULAIR) 10 MG tablet Take 1 tablet (10 mg total) by mouth at bedtime. 90  tablet 1   Multiple Vitamins-Minerals (BARIATRIC FUSION) CHEW 1 chew PO daily (Patient taking differently: Chew 1 each by mouth daily. 1 chew PO daily) 90 tablet 3   omeprazole (PRILOSEC) 20 MG capsule Take 1 capsule (20 mg total) by mouth 2 (two) times daily before a meal. 60 capsule 1   PEG-KCl-NaCl-NaSulf-Na Asc-C (PLENVU) 140 g SOLR Take 140 g by mouth as directed. Manufacturer's coupon Universal coupon code:BIN: P2366821; GROUP: [id number]; PCN: [id number]; ID: [id number]; PAY NO MORE $50 1 each 0   Propylene Glycol (SYSTANE BALANCE) 0.6 % SOLN Place 1 drop into both eyes daily as needed (dry eyes).     traZODone (DESYREL) 50 MG tablet Take 1 tablet (50 mg total) by mouth at bedtime as needed for sleep. 90 tablet 1   Vitamin A 10000 units TABS Take 0.5 tablets (5,000 Units total) by mouth daily. (Patient taking differently: Take 10,000 Units by mouth daily.) 30 each 0   No current facility-administered medications for this visit.    Allergies as of 01/11/2021   (No Known Allergies)    Vitals: BP 110/80   Pulse 60   Ht 5\' 6"  (1.676 m)   Wt (!) 411 lb 6.4 oz (186.6 kg)   BMI 66.40 kg/m  Last Weight:  Wt Readings from Last 1 Encounters:  01/11/21 (!) 411 lb 6.4 oz (186.6 kg)   Last Height:   Ht Readings from Last 1 Encounters:  01/11/21 5\' 6"  (1.676 m)     Physical exam: Exam: Gen: NAD, conversant, well nourised, mobid obese, well groomed                     CV: RRR, no MRG. No Carotid Bruits. + peripheral edema, warm, nontender Eyes: Conjunctivae clear without exudates or hemorrhage  Neuro: Detailed Neurologic Exam  Speech:    Speech is normal; fluent and spontaneous with normal comprehension.  Cognition:    The patient is oriented to person, place, and time;  recent and remote memory intact;     language fluent;     normal attention, concentration,     fund of knowledge Cranial Nerves:    The pupils are equal, round, and reactive to light. The fundi are flat. Visual  fields are full to finger confrontation. Extraocular movements are intact. Trigeminal sensation is intact and the muscles of mastication are normal. The face is symmetric. The palate elevates in the midline. Hearing intact. Voice is normal. Shoulder shrug is normal. The tongue has normal motion without fasciculations.   Coordination:    Normal   Gait:   Antalgic, uses a cane, wide based may be due to extremely large body habitus  Motor Observation:    No asymmetry, no atrophy, and no involuntary movements noted. Tone:    Normal muscle tone.    Posture:    Posture is normal. normal erect    Strength:    Strength is V/V in the upper and lower limbs.      Sensation: intact to LT     Reflex Exam:  DTR's:    Deep tendon reflexes in the upper and lower extremities are normal bilaterally.   Toes:    The toes are downgoing bilaterally.   Clonus:    Clonus is absent.    Assessment/Plan:  Lovely patient 55 y.o. female here as requested by Luetta Nutting, DO for abnormal sensation of eye. PMHx b12 def, anemia, anxiety, migraines, b12 def, depression, headache, hyoerglycemia, hypothyroid, obesity, sleep apnea, gastric bypass, edema due to peripheral venous insufficiency, copper and vit d deficiency.  She a has several concerns including a tightness/sensation about her eyes like they are swollen, vision changes (auras?), hx of migraine, mild headaches esp in the morning (likely hr sleep apnea is contributing, discussed), No ptosis, no facial weakness, no problems swallowing, no coughing, she had an upper Gi series and swallowing was fine, tomorrow having endoscopy. She always has tightness around ger eyes and swelling.  She feels her memory is poor. . +morning headaches and supine headaches. No other focal neurologic deficits, associated symptoms, inciting events or modifiable factors.  She takes a high dose of Vitamin A which can be a cause of pseudotumor cerebri: MRI brain and asked her to  discuss dosing with her GI/hematology and check level.  Aura: Try magnesium, it has evidence for stopping cortical spreading depression in aura, check mag level  MRI brain: MRI brain due to concerning symptoms of morning headaches, positional headaches,vision changes  to look for space occupying mass, chiari or intracranial hypertension (pseudotumor).  Discussed risk of stroke in patients with migraine with aura  If above normal, can follow up in a few months and then return to pcp   Orders Placed This Encounter  Procedures   MR BRAIN W WO CONTRAST   Vitamin A   Magnesium   Basic Metabolic Panel   Discussed: To prevent or relieve headaches, try the following: Cool Compress. Lie down and place a cool compress on your head.  Avoid headache triggers. If certain foods or odors seem to have triggered your migraines in the past, avoid them. A headache diary might help you identify triggers.  Include physical activity in your daily routine. Try a daily walk or other moderate aerobic exercise.  Manage stress. Find healthy ways to cope with the stressors, such as delegating tasks on your to-do list.  Practice relaxation techniques. Try deep breathing, yoga, massage and visualization.  Eat regularly. Eating regularly scheduled meals and maintaining a  healthy diet might help prevent headaches. Also, drink plenty of fluids.  Follow a regular sleep schedule. Sleep deprivation might contribute to headaches Consider biofeedback. With this mind-body technique, you learn to control certain bodily functions -- such as muscle tension, heart rate and blood pressure -- to prevent headaches or reduce headache pain.    Proceed to emergency room if you experience new or worsening symptoms or symptoms do not resolve, if you have new neurologic symptoms or if headache is severe, or for any concerning symptom.   Provided education and documentation from American headache Society toolbox including articles on:  chronic migraine medication overuse headache, chronic migraines, prevention of migraines, behavioral and other nonpharmacologic treatments for headache.    Cc: Luetta Nutting, DO,  Luetta Nutting, DO  Sarina Ill, [facility] [address] [place], [place] [address]  Phone [phone] Fax [phone]

## 2021-01-11 NOTE — Anesthesia Preprocedure Evaluation (Addendum)
Anesthesia Evaluation  Patient identified by MRN, date of birth, ID band  Reviewed: Allergy & Precautions, NPO status , Patient's Chart, lab work & pertinent test results  Airway Mallampati: II  TM Distance: >3 FB Neck ROM: Full    Dental no notable dental hx. (+) Teeth Intact, Dental Advisory Given   Pulmonary asthma , sleep apnea ,    Pulmonary exam normal breath sounds clear to auscultation       Cardiovascular Exercise Tolerance: Good Normal cardiovascular exam Rhythm:Regular Rate:Normal     Neuro/Psych  Headaches,    GI/Hepatic Neg liver ROS, GERD  ,  Endo/Other  Hypothyroidism Morbid obesity (BMI 66.40)  Renal/GU      Musculoskeletal   Abdominal   Peds  Hematology  (+) anemia ,   Anesthesia Other Findings   Reproductive/Obstetrics                            Anesthesia Physical Anesthesia Plan  ASA: 3  Anesthesia Plan: MAC   Post-op Pain Management: Ofirmev IV (intra-op)   Induction:   PONV Risk Score and Plan: Treatment may vary due to age or medical condition  Airway Management Planned: Natural Airway and Simple Face Mask  Additional Equipment: None  Intra-op Plan:   Post-operative Plan:   Informed Consent: I have reviewed the patients History and Physical, chart, labs and discussed the procedure including the risks, benefits and alternatives for the proposed anesthesia with the patient or authorized representative who has indicated his/her understanding and acceptance.     Dental advisory given  Plan Discussed with: CRNA and Anesthesiologist  Anesthesia Plan Comments:        Anesthesia Quick Evaluation

## 2021-01-11 NOTE — Patient Instructions (Addendum)
May try Magnesium for aura (see the document I gave you) 400-600mg  a day mag citrate MRI brain w/wo contrast Blood work Continue cpap  There is increased risk for stroke in women with migraine with aura and a contraindication for the combined contraceptive pill for use by women who have migraine with aura. The risk for women with migraine without aura is lower. However other risk factors like smoking are far more likely to increase stroke risk than migraine. There is a recommendation for no smoking and for the use of OCPs without estrogen such as progestogen only pills particularly for women with migraine with aura.Marland Kitchen People who have migraine headaches with auras may be 3 times more likely to have a stroke caused by a blood clot, compared to migraine patients who don't see auras. Women who take hormone-replacement therapy may be 30 percent more likely to suffer a clot-based stroke than women not taking medication containing estrogen. Other risk factors like smoking and high blood pressure may be  much more important.

## 2021-01-12 ENCOUNTER — Encounter (HOSPITAL_COMMUNITY)
Admission: RE | Disposition: A | Discharge status: Home / Self Care | Payer: Self-pay | Source: Home / Self Care | Attending: Gastroenterology

## 2021-01-12 ENCOUNTER — Ambulatory Visit (HOSPITAL_COMMUNITY)
Admission: RE | Admit: 2021-01-12 | Discharge: 2021-01-12 | Disposition: A | Discharge status: Home / Self Care | Payer: 59 | Attending: Gastroenterology | Admitting: Gastroenterology

## 2021-01-12 ENCOUNTER — Encounter (HOSPITAL_COMMUNITY): Payer: Self-pay | Admitting: Gastroenterology

## 2021-01-12 ENCOUNTER — Ambulatory Visit (HOSPITAL_COMMUNITY): Payer: 59 | Admitting: Certified Registered"

## 2021-01-12 ENCOUNTER — Other Ambulatory Visit: Payer: Self-pay

## 2021-01-12 DIAGNOSIS — K219 Gastro-esophageal reflux disease without esophagitis: Secondary | ICD-10-CM | POA: Diagnosis not present

## 2021-01-12 DIAGNOSIS — R0989 Other specified symptoms and signs involving the circulatory and respiratory systems: Secondary | ICD-10-CM

## 2021-01-12 DIAGNOSIS — Z9884 Bariatric surgery status: Secondary | ICD-10-CM | POA: Insufficient documentation

## 2021-01-12 DIAGNOSIS — K621 Rectal polyp: Secondary | ICD-10-CM | POA: Diagnosis not present

## 2021-01-12 DIAGNOSIS — Z800 Family history of malignant neoplasm of digestive organs: Secondary | ICD-10-CM | POA: Insufficient documentation

## 2021-01-12 DIAGNOSIS — K3189 Other diseases of stomach and duodenum: Secondary | ICD-10-CM | POA: Insufficient documentation

## 2021-01-12 DIAGNOSIS — E063 Autoimmune thyroiditis: Secondary | ICD-10-CM | POA: Insufficient documentation

## 2021-01-12 DIAGNOSIS — Z79899 Other long term (current) drug therapy: Secondary | ICD-10-CM | POA: Diagnosis not present

## 2021-01-12 DIAGNOSIS — R1312 Dysphagia, oropharyngeal phase: Secondary | ICD-10-CM | POA: Diagnosis not present

## 2021-01-12 DIAGNOSIS — D122 Benign neoplasm of ascending colon: Secondary | ICD-10-CM | POA: Insufficient documentation

## 2021-01-12 DIAGNOSIS — E039 Hypothyroidism, unspecified: Secondary | ICD-10-CM | POA: Diagnosis not present

## 2021-01-12 DIAGNOSIS — F419 Anxiety disorder, unspecified: Secondary | ICD-10-CM | POA: Diagnosis not present

## 2021-01-12 DIAGNOSIS — E669 Obesity, unspecified: Secondary | ICD-10-CM | POA: Diagnosis not present

## 2021-01-12 DIAGNOSIS — K2289 Other specified disease of esophagus: Secondary | ICD-10-CM | POA: Diagnosis not present

## 2021-01-12 DIAGNOSIS — Z8371 Family history of colonic polyps: Secondary | ICD-10-CM | POA: Diagnosis not present

## 2021-01-12 DIAGNOSIS — R1319 Other dysphagia: Secondary | ICD-10-CM

## 2021-01-12 DIAGNOSIS — K449 Diaphragmatic hernia without obstruction or gangrene: Secondary | ICD-10-CM | POA: Insufficient documentation

## 2021-01-12 DIAGNOSIS — K5730 Diverticulosis of large intestine without perforation or abscess without bleeding: Secondary | ICD-10-CM | POA: Insufficient documentation

## 2021-01-12 DIAGNOSIS — Z6841 Body Mass Index (BMI) 40.0 and over, adult: Secondary | ICD-10-CM | POA: Insufficient documentation

## 2021-01-12 DIAGNOSIS — R1310 Dysphagia, unspecified: Secondary | ICD-10-CM | POA: Diagnosis not present

## 2021-01-12 DIAGNOSIS — Z1211 Encounter for screening for malignant neoplasm of colon: Secondary | ICD-10-CM | POA: Insufficient documentation

## 2021-01-12 DIAGNOSIS — D125 Benign neoplasm of sigmoid colon: Secondary | ICD-10-CM

## 2021-01-12 DIAGNOSIS — R059 Cough, unspecified: Secondary | ICD-10-CM

## 2021-01-12 HISTORY — PX: POLYPECTOMY: SHX5525

## 2021-01-12 HISTORY — PX: BIOPSY: SHX5522

## 2021-01-12 HISTORY — PX: ESOPHAGOGASTRODUODENOSCOPY (EGD) WITH PROPOFOL: SHX5813

## 2021-01-12 HISTORY — PX: COLONOSCOPY WITH PROPOFOL: SHX5780

## 2021-01-12 HISTORY — PX: HEMOSTASIS CLIP PLACEMENT: SHX6857

## 2021-01-12 SURGERY — ESOPHAGOGASTRODUODENOSCOPY (EGD) WITH PROPOFOL
Anesthesia: Monitor Anesthesia Care

## 2021-01-12 MED ORDER — KETAMINE HCL 10 MG/ML IJ SOLN
INTRAMUSCULAR | Status: AC
  Filled 2021-01-12: qty 1

## 2021-01-12 MED ORDER — PROPOFOL 500 MG/50ML IV EMUL
INTRAVENOUS | Status: DC | PRN
  Administered 2021-01-12: 75 ug/kg/min via INTRAVENOUS

## 2021-01-12 MED ORDER — PHENYLEPHRINE 40 MCG/ML (10ML) SYRINGE FOR IV PUSH (FOR BLOOD PRESSURE SUPPORT)
PREFILLED_SYRINGE | INTRAVENOUS | Status: DC | PRN
  Administered 2021-01-12: 80 ug via INTRAVENOUS
  Administered 2021-01-12: 120 ug via INTRAVENOUS
  Administered 2021-01-12 (×2): 80 ug via INTRAVENOUS
  Administered 2021-01-12: 120 ug via INTRAVENOUS
  Administered 2021-01-12: 80 ug via INTRAVENOUS

## 2021-01-12 MED ORDER — KETAMINE HCL 10 MG/ML IJ SOLN
INTRAMUSCULAR | Status: DC | PRN
  Administered 2021-01-12 (×2): 10 mg via INTRAVENOUS

## 2021-01-12 MED ORDER — GLYCOPYRROLATE 0.2 MG/ML IJ SOLN
INTRAMUSCULAR | Status: DC | PRN
  Administered 2021-01-12 (×2): .1 mg via INTRAVENOUS

## 2021-01-12 MED ORDER — PROPOFOL 10 MG/ML IV BOLUS
INTRAVENOUS | Status: DC | PRN
  Administered 2021-01-12 (×3): 20 mg via INTRAVENOUS

## 2021-01-12 MED ORDER — EPHEDRINE SULFATE-NACL 50-0.9 MG/10ML-% IV SOSY
PREFILLED_SYRINGE | INTRAVENOUS | Status: DC | PRN
  Administered 2021-01-12: 10 mg via INTRAVENOUS

## 2021-01-12 MED ORDER — LACTATED RINGERS IV SOLN: INTRAVENOUS | Status: DC

## 2021-01-12 MED ORDER — SODIUM CHLORIDE 0.9 % IV SOLN: INTRAVENOUS | Status: DC

## 2021-01-12 SURGICAL SUPPLY — 24 items

## 2021-01-12 NOTE — Op Note (Addendum)
[facility] Patient Name: Kristy Sharp Procedure Date: 01/12/2021 MRN: [id number] Attending MD: Thornton Park MD, MD Date of Birth: 02/17/1966 CSN: [id number] Age: 55 Admit Type: Outpatient Procedure:                Upper GI endoscopy Indications:              Dysphagia not explained by UGI series                           Globus                           Cough/choking                           Laryngopharyngeal reflux on previous evaluation                            with ENT Providers:                Thornton Park MD, MD, Burtis Junes, RN, Jaci Carrel, RN, Tyna Jaksch Technician Referring MD:              Medicines:                Monitored Anesthesia Care Complications:            No immediate complications. Estimated blood loss:                            Minimal. Estimated Blood Loss:     Estimated blood loss was minimal. Procedure:                Pre-Anesthesia Assessment:                           - Prior to the procedure, a History and Physical                            was performed, and patient medications and                            allergies were reviewed. The patient's tolerance of                            previous anesthesia was also reviewed. The risks                            and benefits of the procedure and the sedation                            options and risks were discussed with the patient.                            All questions were answered, and informed consent  was obtained. Prior Anticoagulants: The patient has                            taken no previous anticoagulant or antiplatelet                            agents. ASA Grade Assessment: III - A patient with                            severe systemic disease. After reviewing the risks                            and benefits, the patient was deemed in                            satisfactory condition to undergo  the procedure.                           After obtaining informed consent, the endoscope was                            passed under direct vision. Throughout the                            procedure, the patient's blood pressure, pulse, and                            oxygen saturations were monitored continuously. The                            [id number]-[id number] ([id number] Olympus endoscope was introduced                            through the mouth, and advanced to the third part                            of duodenum. The upper GI endoscopy was                            accomplished without difficulty. The patient                            tolerated the procedure well. Scope In: Scope Out: Findings:      Localized mild mucosal changes characterized by congestion were found at       the gastroesophageal junction. The z-line is located 36 cm from the       incisors. Biopsies were taken from the mid/proximal and distal esophagus       with a cold forceps for histology. Estimated blood loss was minimal.      Evidence of a gastric bypass was found. A smalll gastric pouch was       found. The gastric mucosa appeared normal. Biopsies were obtained with       cold forceps for histology. The gastrojejunal anastomosis was  characterized by healthy appearing mucosa. This was traversed. The       pouch-to-jejunum limb was characterized by healthy appearing mucosa. The       jejunojejunal anastomosis was characterized by healthy appearing mucosa.      The examined jejunum was normal. Impression:               - Congested mucosa in the esophagus. Biopsied.                           - Gastric bypass with a small-sized pouch.                           - Gastric mucosa biopsied.                           - Gastrojejunal anastomosis characterized by                            healthy appearing mucosa.                           - Normal examined jejunum. Moderate Sedation:      Not Applicable - Patient  had care per Anesthesia. Recommendation:           - Patient has a contact number available for                            emergencies. The signs and symptoms of potential                            delayed complications were discussed with the                            patient. Return to normal activities tomorrow.                            Written discharge instructions were provided to the                            patient.                           - Continue present medications including omeprazole                            40 mg BID.                           - Await pathology results.                           - Proceed with colonoscopy today as previously                            planned. Procedure Code(s):        --- Professional ---  40102, Esophagogastroduodenoscopy, flexible,                            transoral; with biopsy, single or multiple Diagnosis Code(s):        --- Professional ---                           K22.8, Other specified diseases of esophagus                           Z98.84, Bariatric surgery status                           R13.10, Dysphagia, unspecified                           F45.8, Other somatoform disorders CPT copyright 2019 American Medical Association. All rights reserved. The codes documented in this report are preliminary and upon coder review may  be revised to meet current compliance requirements. Thornton Park MD, MD 01/12/2021 2:06:51 PM This report has been signed electronically. Number of Addenda: 0

## 2021-01-12 NOTE — H&P (Signed)
Referring Provider: No ref. provider found Primary Care Physician:  Luetta Nutting, DO   Indication for procedure: Oralpharyngeal dysphagia   IMPRESSION:  Dysphagia-unclear if oral pharyngeal or esophageal Globus Cough/choking Laryngopharyngeal reflux on previous evaluation with ENT History of colon polyps    - 3 adenomas on colonoscopy 2015 in [place], [place] Gastric Bypass 2004 in [place] BMI 65.57 Family history of colon cancer (maternal grandmother in her late 63s) Family history of colon polyps (mother)  Globus, cough, and choking may be reflect known LPR.  EGD recommended to evaluate for gastroesophageal reflux, gastric inlet pouch in the proximal esophagus, Zenker's diverticulum, complications of gastric bypass and eosinophilic esophagitis as concurrent causes of her symptoms.  If upper endoscopy is negative would consider esophageal manometry, esophageal impedance, and pH testing to exclude esophageal motility disorders.  Surveillance colonoscopy recommended given her history of polyps. Will obtain prior colonoscopy report.   PLAN: - EGD with biopsies to be performed at the hospital - Surveillance colonoscopy to be performed at the hospital   HPI: Catie Gebel is a 55 y.o. female referred by Dr. Zigmund Daniel for further evaluation of oral pharyngeal dysphagia.  The history is obtained through the patient and review of her electronic health record.  She has anxiety, asthma, Hashimoto's thyroiditis, hypothyroidism, obesity, gastric bypass 2004 and cholecystectomy.  She was previously seen by [facility] 01/07/2019 for reflux, constipation, and a history of colon polyps. Although she does not remembering seeing the [facility] team in 2020. 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   She has longstanding sensation of throat tightness, throat clearing, globus, and dysphagia. Described as something "constricting the throat." Associated breathing  difficulty but she didn't feel that it was related to her breathing. When symptoms are most severe, she has fullness in her neck. Reflux symptoms when she eats late at night although she denies heartburn. No lateralization of the symptoms, dysphagia, odynophagia, weight loss, a change in voice, presence of a neck or tonsillar mass, or unexplained cervical adenopathy.  Symptoms persisted and even worsened despite adjustment of her thyroid medications. Now also having headaches.   Evaluation by ENT revealed a diagnosis of mild reflux.  Flexible laryngoscopy performed 04/09/2020 showed moderate edema of the hypopharynx she was treated with omeprazole 40 mg daily with recommendation to increase to twice daily if needed. Most recent visit with ENT from 04/29/20. She did not find that twice daily was better.  She made the diet adjustments as recommended by ENT without a change in her symptoms.  She recently switched to a new primary care provider who decreased her omeprazole to 20 mg daily.  He told her he was concerned about a diverticulum causing her symptoms. She is most concerned about having cancer.   Colonoscopy in [place] in 2015 revealed 3 polyps.  Surveillance recommended in 3 years.  Colon cancer in her maternal grandmother at age 56, colon polyps in her mother.  No other known family history of colon cancer or colon polyps.  Thyroid ultrasound 09/10/2020 showed an enlarged and diffusely heterogeneous thyroid gland.  No nodules.  UGI series 12/03/20: postop gastic bypass, hiatal hernia with moderate reflux, passage of barium tablet into the stomach without resistance  Past Medical History:  Diagnosis Date   Allergic rhinitis due to other allergen    Allergy    Anemia    Anxiety    Asthma    B12 deficiency 04/27/2016   Bursitis, trochanteric    Cellulitis  Colon polyps    Depression    Gallstones    Headache    Heart palpitations    Hypercholesteremia    Hyperglycemia     Hypothyroidism due to Hashimoto's thyroiditis 07/04/2017   Insomnia    Iron deficiency anemia 04/27/2016   Morbid obesity ([place]) 07/04/2017   Obesity    Pneumonia    Right lumbar radiculopathy 05/02/2013   Sciatica    Sinusitis    Sleep apnea    CPAP   Vitamin B deficiency    Vitamin D deficiency     Past Surgical History:  Procedure Laterality Date   CHOLECYSTECTOMY N/A 04/27/2012   Procedure: LAPAROSCOPIC CHOLECYSTECTOMY;  Surgeon: Donato Heinz, MD;  Location: [facility];  Service: General;  Laterality: N/A;   EYE MUSCLE SURGERY Left    age 36   GASTRIC BYPASS     REFRACTIVE SURGERY Bilateral    WRIST SURGERY Left     Prior to Admission medications   Medication Sig Start Date End Date Taking? Authorizing Provider  albuterol (PROVENTIL HFA) 108 (90 Base) MCG/ACT inhaler Inhale 2 puffs into the lungs every 6 (six) hours as needed for wheezing or shortness of breath. 10/13/20  Yes Luetta Nutting, DO  benzonatate (TESSALON) 200 MG capsule Take 1 capsule (200 mg total) by mouth 2 (two) times daily as needed for cough. 10/22/20  Yes Luetta Nutting, DO  Cholecalciferol (VITAMIN D PO) Take 2 capsules by mouth at bedtime. 10,000 units per day   Yes [provider]  citalopram (CELEXA) 40 MG tablet Take 1 tablet (40 mg total) by mouth daily. 09/28/20  Yes Luetta Nutting, DO  cyanocobalamin (,VITAMIN B-12,) 1000 MCG/ML injection Inject 1 mL (1,000 mcg total) into the muscle every 30 (thirty) days. 09/18/20  Yes Gorsuch, Ni, MD  diltiazem (CARDIZEM CD) 120 MG 24 hr capsule Take 1 capsule (120 mg total) by mouth daily. 02/24/20 11/20/20 Yes O'Neal, Cassie Freer, MD  FOLIC ACID PO Take 1 tablet by mouth daily.   Yes [provider]  furosemide (LASIX) 20 MG tablet Take 1 tablet (20 mg total) by mouth daily as needed. Patient taking differently: Take 20 mg by mouth as needed. 03/12/18  Yes Trixie Dredge, PA-C  levothyroxine (SYNTHROID) 88 MCG tablet Take 1 tablet (88 mcg  total) by mouth daily before breakfast. 09/14/20  Yes Luetta Nutting, DO  LORazepam (ATIVAN) 1 MG tablet Take 1 tablet (1 mg total) by mouth daily as needed for anxiety. 09/28/20  Yes Luetta Nutting, DO  meloxicam (MOBIC) 15 MG tablet Take 1 tablet (15 mg total) by mouth daily as needed for pain. 09/28/20  Yes Luetta Nutting, DO  montelukast (SINGULAIR) 10 MG tablet Take 1 tablet (10 mg total) by mouth at bedtime. 09/28/20  Yes Luetta Nutting, DO  Multiple Vitamins-Minerals (BARIATRIC FUSION) CHEW 1 chew PO daily 03/12/18  Yes Trixie Dredge, PA-C  omeprazole (PRILOSEC) 20 MG capsule Take 1 capsule (20 mg total) by mouth daily as needed. 09/28/20  Yes Luetta Nutting, DO  traZODone (DESYREL) 50 MG tablet Take 1 tablet (50 mg total) by mouth at bedtime as needed for sleep. 09/28/20  Yes Luetta Nutting, DO  Vitamin A 10000 units TABS Take 0.5 tablets (5,000 Units total) by mouth daily. 03/20/18  Yes Trixie Dredge, PA-C    Current Facility-Administered Medications  Medication Dose Route Frequency Provider Last Rate Last Admin   0.9 %  sodium chloride infusion   Intravenous Continuous Thornton Park, MD  lactated ringers infusion   Intravenous Continuous Thornton Park, MD 20 mL/hr at 01/12/21 1244 New Bag at 01/12/21 1244    Allergies as of 11/23/2020   (No Known Allergies)    Family History  Problem Relation Age of Onset   Lupus Mother    Atrial fibrillation Mother    Gout Mother    Colon polyps Mother    Lung cancer Father    Cancer - Other Sister        appendix   Colon cancer Maternal Grandmother 62   Heart attack Maternal Grandmother    Stroke Maternal Grandfather    Hepatitis Paternal Grandfather        blood transfusion     Physical Exam: General:   Alert,  well-nourished, pleasant and cooperative in NAD Head:  Normocephalic and atraumatic. Eyes:  Sclera clear, no icterus.   Conjunctiva pink. Ears:  Normal auditory acuity. Nose:  No deformity,  discharge,  or lesions. Mouth:  No deformity or lesions.   Neck:  Supple; no masses or thyromegaly. Lungs:  Clear throughout to auscultation.   No wheezes. Heart:  Regular rate and rhythm; no murmurs. Abdomen:  Soft,nontender, nondistended, normal bowel sounds, no rebound or guarding. No hepatosplenomegaly.   Rectal:  Deferred  Msk:  Symmetrical. No boney deformities LAD: No inguinal or umbilical LAD Extremities:  No clubbing or edema. Neurologic:  Alert and  oriented x4;  grossly nonfocal Skin:  Intact without significant lesions or rashes. Psych:  Alert and cooperative. Normal mood and affect.    Dyron Kawano L. Tarri Glenn, MD, MPH 01/12/2021, 12:56 PM

## 2021-01-12 NOTE — Op Note (Signed)
[facility] Patient Name: Kristy Sharp Procedure Date: 01/12/2021 MRN: [id number] Attending MD: Thornton Park MD, MD Date of Birth: 07-Feb-1966 CSN: [id number] Age: 55 Admit Type: Outpatient Procedure:                Colonoscopy Indications:              Surveillance: Personal history of adenomatous                            polyps on last colonoscopy > 5 years ago                           History of colon polyps                           - 3 adenomas on colonoscopy 2015 in [place], [place]                           Family history of colon cancer (maternal                            grandmother in her late 66s)                           Family history of colon polyps (mother) Providers:                Thornton Park MD, MD, Burtis Junes, RN, Jaci Carrel, RN, Tyna Jaksch Technician Referring MD:              Medicines:                Monitored Anesthesia Care Complications:            No immediate complications. Estimated blood loss:                            Minimal. Estimated Blood Loss:     Estimated blood loss was minimal. Procedure:                Pre-Anesthesia Assessment:                           - Prior to the procedure, a History and Physical                            was performed, and patient medications and                            allergies were reviewed. The patient's tolerance of                            previous anesthesia was also reviewed. The risks                            and benefits of the procedure and the sedation  options and risks were discussed with the patient.                            All questions were answered, and informed consent                            was obtained. Prior Anticoagulants: The patient has                            taken no previous anticoagulant or antiplatelet                            agents. ASA Grade Assessment: III - A patient with                             severe systemic disease. After reviewing the risks                            and benefits, the patient was deemed in                            satisfactory condition to undergo the procedure.                           After obtaining informed consent, the colonoscope                            was passed under direct vision. Throughout the                            procedure, the patient's blood pressure, pulse, and                            oxygen saturations were monitored continuously. The                            [id number]-HQ190L ([id number]) Olympus colonoscope was                            introduced through the anus and advanced to the 3                            cm into the ileum. A second forward view of the                            right colon was performed. The colonoscopy was                            performed with moderate difficulty due to a                            redundant colon and significant looping. Successful  completion of the procedure was aided by applying                            abdominal pressure. The patient tolerated the                            procedure well. The quality of the bowel                            preparation was good. The terminal ileum, ileocecal                            valve, appendiceal orifice, and rectum were                            photographed. Scope In: 1:31:56 PM Scope Out: 1:52:04 PM Scope Withdrawal Time: 0 hours 15 minutes 50 seconds  Total Procedure Duration: 0 hours 20 minutes 8 seconds  Findings:      The perianal and digital rectal examinations were normal.      A 2 mm polyp was found in the rectum. The polyp was sessile. The polyp       was removed with a cold snare. Resection and retrieval were complete.       Estimated blood loss was minimal.      A 12-14 mm polyp was found in the rectum. The polyp was       semi-pedunculated. The polyp was removed with a cold  snare. Resection       and retrieval were complete. There was some prolonged oozing after the       procedure. One hemostatic clip was successfully placed with cessation of       bleeding. There was no bleeding at the end of the procedure.      A 1 mm polyp was found in the proximal ascending colon. The polyp was       sessile. The polyp was removed with a piecemeal technique using a cold       biopsy forceps. Due to looping, positioning a snare was not possible.       Resection and retrieval were complete. Estimated blood loss was minimal.      Mild diverticulosis in the sigmoid, descending colon, and ascending       colon. The exam was otherwise without abnormality on direct and       retroflexion views. Impression:               - One 2 mm polyp in the rectum, removed with a cold                            snare. Resected and retrieved.                           - One 12-14 mm polyp in the rectum, removed with a                            cold snare. Resected and retrieved. Clip was placed  at the polypectomy site.                           - One 1 mm polyp in the proximal ascending colon,                            removed piecemeal using a cold biopsy forceps.                            Resected and retrieved.                           - Mild diverticulosis.                           - The examination was otherwise normal on direct                            and retroflexion views. Moderate Sedation:      Not Applicable - Patient had care per Anesthesia. Recommendation:           - Patient has a contact number available for                            emergencies. The signs and symptoms of potential                            delayed complications were discussed with the                            patient. Return to normal activities tomorrow.                            Written discharge instructions were provided to the                            patient.                            - Resume previous diet. High fiber diet recommended.                           - Continue present medications.                           - Await pathology results.                           - Repeat colonoscopy date to be determined after                            pending pathology results are reviewed for                            surveillance.                           -  Emerging evidence supports eating a diet of                            fruits, vegetables, grains, calcium, and yogurt                            while reducing red meat and alcohol may reduce the                            risk of colon cancer.                           - Given these results, all first degree relatives                            (brothers, sisters, children, parents) should start                            colon cancer screening at age 26.                           - Thank you for allowing me to be involved in your                            colon cancer prevention. Procedure Code(s):        --- Professional ---                           [phone], Colonoscopy, flexible; with removal of                            tumor(s), polyp(s), or other lesion(s) by snare                            technique                           45380, 79, Colonoscopy, flexible; with biopsy,                            single or multiple Diagnosis Code(s):        --- Professional ---                           Z86.010, Personal history of colonic polyps                           K62.1, Rectal polyp                           K63.5, Polyp of colon CPT copyright 2019 [employer]. All rights reserved. The codes documented in this report are preliminary and upon coder review may  be revised to meet current compliance requirements. Thornton Park MD, MD 01/12/2021 2:14:34 PM This report has been signed electronically. Number of Addenda: 0

## 2021-01-12 NOTE — Discharge Instructions (Signed)
YOU HAD AN ENDOSCOPIC PROCEDURE TODAY: Refer to the procedure report and other information in the discharge instructions given to you for any specific questions about what was found during the examination. If this information does not answer your questions, please call [facility] office at [phone] to clarify.  ° °YOU SHOULD EXPECT: Some feelings of bloating in the abdomen. Passage of more gas than usual. Walking can help get rid of the air that was put into your GI tract during the procedure and reduce the bloating. If you had a lower endoscopy (such as a colonoscopy or flexible sigmoidoscopy) you may notice spotting of blood in your stool or on the toilet paper. Some abdominal soreness may be present for a day or two, also. ° °DIET: Your first meal following the procedure should be a light meal and then it is ok to progress to your normal diet. A half-sandwich or bowl of soup is an example of a good first meal. Heavy or fried foods are harder to digest and may make you feel nauseous or bloated. Drink plenty of fluids but you should avoid alcoholic beverages for 24 hours. If you had a esophageal dilation, please see attached instructions for diet.   ° °ACTIVITY: Your care partner should take you home directly after the procedure. You should plan to take it easy, moving slowly for the rest of the day. You can resume normal activity the day after the procedure however YOU SHOULD NOT DRIVE, use power tools, machinery or perform tasks that involve climbing or major physical exertion for 24 hours (because of the sedation medicines used during the test).  ° °SYMPTOMS TO REPORT IMMEDIATELY: °A gastroenterologist can be reached at any hour. Please call [phone]  for any of the following symptoms:  °Following lower endoscopy (colonoscopy, flexible sigmoidoscopy) °Excessive amounts of blood in the stool  °Significant tenderness, worsening of abdominal pains  °Swelling of the abdomen that is new, acute  °Fever of 100° or  higher  °Following upper endoscopy (EGD, EUS, ERCP, esophageal dilation) °Vomiting of blood or coffee ground material  °New, significant abdominal pain  °New, significant chest pain or pain under the shoulder blades  °Painful or persistently difficult swallowing  °New shortness of breath  °Black, tarry-looking or red, bloody stools ° °FOLLOW UP:  °If any biopsies were taken you will be contacted by phone or by letter within the next 1-3 weeks. Call [phone]  if you have not heard about the biopsies in 3 weeks.  °Please also call with any specific questions about appointments or follow up tests. ° °

## 2021-01-12 NOTE — Anesthesia Postprocedure Evaluation (Signed)
Anesthesia Post Note  Patient: Kristy Sharp  Procedure(s) Performed: ESOPHAGOGASTRODUODENOSCOPY (EGD) WITH PROPOFOL COLONOSCOPY WITH PROPOFOL BIOPSY POLYPECTOMY HEMOSTASIS CLIP PLACEMENT     Anesthesia Type: MAC Anesthetic complications: no   No notable events documented.  Last Vitals:  Vitals:   01/12/21 1359 01/12/21 1400  BP:  (!) 99/39  Pulse: 80 81  Resp: 16 18  Temp:  36.5 C  SpO2: 100% 100%    Last Pain:  Vitals:   01/12/21 1400  TempSrc: Oral  PainSc:                  [place]

## 2021-01-12 NOTE — Transfer of Care (Signed)
Immediate Anesthesia Transfer of Care Note  Patient: Kristy Sharp  Procedure(s) Performed: ESOPHAGOGASTRODUODENOSCOPY (EGD) WITH PROPOFOL COLONOSCOPY WITH PROPOFOL BIOPSY POLYPECTOMY HEMOSTASIS CLIP PLACEMENT  Patient Location: PACU  Anesthesia Type:MAC  Level of Consciousness: awake, alert  and oriented  Airway & Oxygen Therapy: Patient Spontanous Breathing and Patient connected to face mask oxygen  Post-op Assessment: Report given to RN and Post -op Vital signs reviewed and stable  Post vital signs: Reviewed and stable  Last Vitals:  Vitals Value Taken Time  BP 99/39 01/12/21 1400  Temp 36.5 C 01/12/21 1400  Pulse 63 01/12/21 1406  Resp 17 01/12/21 1406  SpO2 100 % 01/12/21 1406  Vitals shown include unvalidated device data.  Last Pain:  Vitals:   01/12/21 1400  TempSrc: Oral  PainSc:          Complications: No notable events documented.

## 2021-01-13 LAB — SURGICAL PATHOLOGY

## 2021-01-14 ENCOUNTER — Encounter: Payer: Self-pay | Admitting: Gastroenterology

## 2021-01-15 ENCOUNTER — Encounter (HOSPITAL_COMMUNITY): Payer: Self-pay | Admitting: Gastroenterology

## 2021-01-17 ENCOUNTER — Encounter: Payer: Self-pay | Admitting: Cardiovascular Disease

## 2021-01-17 LAB — BASIC METABOLIC PANEL
BUN/Creatinine Ratio: 27 — ABNORMAL HIGH (ref 9–23)
BUN: 17 mg/dL (ref 6–24)
CO2: 24 mmol/L (ref 20–29)
Calcium: 9.1 mg/dL (ref 8.7–10.2)
Chloride: 105 mmol/L (ref 96–106)
Creatinine, Ser: 0.63 mg/dL (ref 0.57–1.00)
Glucose: 85 mg/dL (ref 70–99)
Potassium: 4.1 mmol/L (ref 3.5–5.2)
Sodium: 141 mmol/L (ref 134–144)
eGFR: 105 mL/min/{1.73_m2} (ref >59)

## 2021-01-17 LAB — VITAMIN A: Vitamin A: 39.4 ug/dL (ref 20.1–62.0)

## 2021-01-17 LAB — MAGNESIUM: Magnesium: 1.8 mg/dL (ref 1.6–2.3)

## 2021-01-26 ENCOUNTER — Other Ambulatory Visit: Payer: Self-pay

## 2021-01-26 ENCOUNTER — Ambulatory Visit
Admission: RE | Admit: 2021-01-26 | Discharge: 2021-01-26 | Disposition: A | Discharge status: Home / Self Care | Payer: 59 | Source: Ambulatory Visit | Attending: Neurology | Admitting: Neurology

## 2021-01-26 DIAGNOSIS — R413 Other amnesia: Secondary | ICD-10-CM

## 2021-01-26 DIAGNOSIS — H539 Unspecified visual disturbance: Secondary | ICD-10-CM

## 2021-01-26 DIAGNOSIS — R51 Headache with orthostatic component, not elsewhere classified: Secondary | ICD-10-CM

## 2021-01-26 DIAGNOSIS — R519 Headache, unspecified: Secondary | ICD-10-CM

## 2021-01-26 DIAGNOSIS — H579 Unspecified disorder of eye and adnexa: Secondary | ICD-10-CM

## 2021-01-26 MED ORDER — GADOBENATE DIMEGLUMINE 529 MG/ML IV SOLN
20.0000 mL | Freq: Once | INTRAVENOUS | Status: AC | PRN
  Administered 2021-01-26: 20 mL via INTRAVENOUS

## 2021-01-27 ENCOUNTER — Other Ambulatory Visit: Payer: Self-pay | Admitting: Gastroenterology

## 2021-01-27 DIAGNOSIS — K219 Gastro-esophageal reflux disease without esophagitis: Secondary | ICD-10-CM

## 2021-01-27 DIAGNOSIS — Z9884 Bariatric surgery status: Secondary | ICD-10-CM

## 2021-02-05 ENCOUNTER — Encounter: Payer: Self-pay | Admitting: Neurology

## 2021-03-23 ENCOUNTER — Other Ambulatory Visit: Payer: Self-pay

## 2021-03-23 ENCOUNTER — Other Ambulatory Visit: Payer: Self-pay | Admitting: Hematology and Oncology

## 2021-03-23 ENCOUNTER — Ambulatory Visit (INDEPENDENT_AMBULATORY_CARE_PROVIDER_SITE_OTHER): Payer: 59

## 2021-03-23 ENCOUNTER — Ambulatory Visit: Payer: 59 | Admitting: Family Medicine

## 2021-03-23 ENCOUNTER — Encounter: Payer: Self-pay | Admitting: Family Medicine

## 2021-03-23 VITALS — BP 112/63 | HR 67 | Ht 66.0 in | Wt >= 6400 oz

## 2021-03-23 DIAGNOSIS — M546 Pain in thoracic spine: Secondary | ICD-10-CM | POA: Diagnosis not present

## 2021-03-23 DIAGNOSIS — D508 Other iron deficiency anemias: Secondary | ICD-10-CM

## 2021-03-23 DIAGNOSIS — F331 Major depressive disorder, recurrent, moderate: Secondary | ICD-10-CM | POA: Insufficient documentation

## 2021-03-23 MED ORDER — PREDNISONE 50 MG PO TABS: ORAL_TABLET | ORAL | 0 refills | Status: DC

## 2021-03-23 MED ORDER — METHOCARBAMOL 500 MG PO TABS: 500.0000 mg | ORAL_TABLET | Freq: Four times a day (QID) | ORAL | 0 refills | Status: DC | PRN

## 2021-03-23 MED ORDER — KETOROLAC TROMETHAMINE 30 MG/ML IJ SOLN
30.0000 mg | Freq: Once | INTRAMUSCULAR | Status: AC
  Administered 2021-03-23: 30 mg via INTRAMUSCULAR

## 2021-03-23 NOTE — Progress Notes (Signed)
Kristy Sharp - 56 y.o. female MRN [id number]  Date of birth: 1965-03-29  Subjective Chief Complaint  Patient presents with   Back Pain    HPI Kristy Sharp is a 56 year old female here today with complaint of mid back pain.  She reports symptoms started approximately 1 month ago.  Symptoms do wax and wane but have become more severe over the past week.  She has had increased difficulty getting comfortable when sleeping.  Her pain does radiate around to her right rib area.  She denies numbness or tingling.  She denies shortness of breath.  She has been using heat, ice and over-the-counter analgesics with variable improvement.  ROS:  A comprehensive ROS was completed and negative except as noted per HPI  No Known Allergies  Past Medical History:  Diagnosis Date   Allergic rhinitis due to other allergen    Allergy    Anemia    Anxiety    Asthma    B12 deficiency 04/27/2016   Bursitis, trochanteric    Cellulitis    Colon polyps    Depression    Gallstones    Headache    Heart palpitations    Hypercholesteremia    Hyperglycemia    Hypothyroidism due to Hashimoto's thyroiditis 07/04/2017   Insomnia    Iron deficiency anemia 04/27/2016   Morbid obesity ([place]) 07/04/2017   Obesity    Pneumonia    Right lumbar radiculopathy 05/02/2013   Sciatica    Sinusitis    Sleep apnea    CPAP   Vitamin B deficiency    Vitamin D deficiency     Past Surgical History:  Procedure Laterality Date   BIOPSY  01/12/2021   Procedure: BIOPSY;  Surgeon: Thornton Park, MD;  Location: [facility];  Service: Gastroenterology;;   CHOLECYSTECTOMY N/A 04/27/2012   Procedure: LAPAROSCOPIC CHOLECYSTECTOMY;  Surgeon: Donato Heinz, MD;  Location: [facility];  Service: General;  Laterality: N/A;   COLONOSCOPY WITH PROPOFOL N/A 01/12/2021   Procedure: COLONOSCOPY WITH PROPOFOL;  Surgeon: Thornton Park, MD;  Location: [facility];  Service: Gastroenterology;  Laterality: N/A;    ESOPHAGOGASTRODUODENOSCOPY (EGD) WITH PROPOFOL N/A 01/12/2021   Procedure: ESOPHAGOGASTRODUODENOSCOPY (EGD) WITH PROPOFOL;  Surgeon: Thornton Park, MD;  Location: [facility];  Service: Gastroenterology;  Laterality: N/A;   EYE MUSCLE SURGERY Left    age 41   GASTRIC BYPASS     HEMOSTASIS CLIP PLACEMENT  01/12/2021   Procedure: HEMOSTASIS CLIP PLACEMENT;  Surgeon: Thornton Park, MD;  Location: [facility];  Service: Gastroenterology;;   POLYPECTOMY  01/12/2021   Procedure: POLYPECTOMY;  Surgeon: Thornton Park, MD;  Location: [facility];  Service: Gastroenterology;;   REFRACTIVE SURGERY Bilateral    WRIST SURGERY Left     Social History   Socioeconomic History   Marital status: Single    Spouse name: Not on file   Number of children: 0   Years of education: college   Highest education level: Not on file  Occupational History   Occupation: customer support agent    Comment: [employer]  Tobacco Use   Smoking status: Never    Passive exposure: Never   Smokeless tobacco: Never  Vaping Use   Vaping Use: Never used  Substance and Sexual Activity   Alcohol use: Yes    Alcohol/week: 1.0 standard drink    Types: 1 Cans of beer per week    Comment: occas.   Drug use: No   Sexual activity: Not Currently    Birth control/protection: Abstinence  Other Topics  Concern   Not on file  Social History Narrative   Patient is single   Patient is right-handed.   Patient works full time at a call center    Education some college   Caffeine coffee two cups daily and some times one soda            Social Determinants of Radio broadcast assistant Strain: Not on file  Food Insecurity: Not on file  Transportation Needs: Not on file  Physical Activity: Not on file  Stress: Not on file  Social Connections: Not on file    Family History  Problem Relation Age of Onset   Lupus Mother    Atrial fibrillation Mother    Gout Mother    Colon polyps Mother    Lung cancer Father     Cancer - Other Sister        appendix   Colon cancer Maternal Grandmother 25   Heart attack Maternal Grandmother    Stroke Maternal Grandfather    Hepatitis Paternal Grandfather        blood transfusion    Health Maintenance  Topic Date Due   HIV Screening  Never done   Hepatitis C Screening  Never done   PAP SMEAR-Modifier  Never done   MAMMOGRAM  03/02/2018   COVID-19 Vaccine (4 - Booster for [place] series) 04/01/2020   TETANUS/TDAP  12/03/2023   COLONOSCOPY (Pts 45-49yrs Insurance coverage will need to be confirmed)  01/13/2031   INFLUENZA VACCINE  Completed   Zoster Vaccines- Shingrix  Completed   HPV VACCINES  Aged Out     ----------------------------------------------------------------------------------------------------------------------------------------------------------------------------------------------------------------- Physical Exam BP 112/63 (BP Location: Left Wrist, Patient Position: Sitting, Cuff Size: Normal)    Pulse 67    Ht 5\' 6"  (1.676 m)    Wt (!) 409 lb (185.5 kg)    SpO2 97%    BMI 66.01 kg/m   Physical Exam Constitutional:      Appearance: Normal appearance.  HENT:     Head: Normocephalic and atraumatic.  Eyes:     General: No scleral icterus. Cardiovascular:     Rate and Rhythm: Normal rate and regular rhythm.  Pulmonary:     Effort: Pulmonary effort is normal.     Breath sounds: Normal breath sounds.  Musculoskeletal:     Cervical back: Neck supple.     Comments: Tenderness to palpation along the right thoracic spine with radiation into the right thoracic rib area.  Neurological:     Mental Status: She is alert.  Psychiatric:        Mood and Affect: Mood normal.        Behavior: Behavior normal.    ------------------------------------------------------------------------------------------------------------------------------------------------------------------------------------------------------------------- Assessment and  Plan  Acute right-sided thoracic back pain Injection of Toradol given today.  Starting course of prednisone along with Robaxin as needed.  Referral placed to physical therapy.  Instructed to follow-up if having any new or worsening symptoms.   Meds ordered this encounter  Medications   predniSONE (DELTASONE) 50 MG tablet    Sig: Take 1 tab po daily x5 days.    Dispense:  5 tablet    Refill:  0   methocarbamol (ROBAXIN) 500 MG tablet    Sig: Take 1 tablet (500 mg total) by mouth every 6 (six) hours as needed for muscle spasms.    Dispense:  45 tablet    Refill:  0   ketorolac (TORADOL) 30 MG/ML injection 30 mg    No follow-ups on file.  This visit occurred during the SARS-CoV-2 public health emergency.  Safety protocols were in place, including screening questions prior to the visit, additional usage of staff PPE, and extensive cleaning of exam room while observing appropriate contact time as indicated for disinfecting solutions.

## 2021-03-23 NOTE — Patient Instructions (Signed)
Start prednisone 50mg  daily for 5 days,  Add Methocarbamol 500mg  every 6 hours as needed.  We'll be in touch with xray results.  You should be contacted by physical therapy.

## 2021-03-23 NOTE — Assessment & Plan Note (Signed)
Injection of Toradol given today.  Starting course of prednisone along with Robaxin as needed.  Referral placed to physical therapy.  Instructed to follow-up if having any new or worsening symptoms.

## 2021-03-24 ENCOUNTER — Inpatient Hospital Stay: Payer: 59 | Attending: Hematology and Oncology

## 2021-03-24 DIAGNOSIS — E538 Deficiency of other specified B group vitamins: Secondary | ICD-10-CM | POA: Insufficient documentation

## 2021-03-24 DIAGNOSIS — Z79899 Other long term (current) drug therapy: Secondary | ICD-10-CM | POA: Diagnosis not present

## 2021-03-24 DIAGNOSIS — D508 Other iron deficiency anemias: Secondary | ICD-10-CM

## 2021-03-24 DIAGNOSIS — D509 Iron deficiency anemia, unspecified: Secondary | ICD-10-CM | POA: Diagnosis not present

## 2021-03-24 DIAGNOSIS — E559 Vitamin D deficiency, unspecified: Secondary | ICD-10-CM | POA: Diagnosis not present

## 2021-03-24 DIAGNOSIS — Z9884 Bariatric surgery status: Secondary | ICD-10-CM | POA: Diagnosis not present

## 2021-03-24 LAB — CBC WITH DIFFERENTIAL/PLATELET
Abs Immature Granulocytes: 0.04 10*3/uL (ref 0.00–0.07)
Basophils Absolute: 0 10*3/uL (ref 0.0–0.1)
Basophils Relative: 0 %
Eosinophils Absolute: 0 10*3/uL (ref 0.0–0.5)
Eosinophils Relative: 1 %
HCT: 39.5 % (ref 36.0–46.0)
Hemoglobin: 13 g/dL (ref 12.0–15.0)
Immature Granulocytes: 1 %
Lymphocytes Relative: 12 %
Lymphs Abs: 1.1 10*3/uL (ref 0.7–4.0)
MCH: 32 pg (ref 26.0–34.0)
MCHC: 32.9 g/dL (ref 30.0–36.0)
MCV: 97.3 fL (ref 80.0–100.0)
Monocytes Absolute: 0.6 10*3/uL (ref 0.1–1.0)
Monocytes Relative: 7 %
Neutro Abs: 6.8 10*3/uL (ref 1.7–7.7)
Neutrophils Relative %: 79 %
Platelets: 234 10*3/uL (ref 150–400)
RBC: 4.06 MIL/uL (ref 3.87–5.11)
RDW: 14.1 % (ref 11.5–15.5)
WBC: 8.6 10*3/uL (ref 4.0–10.5)
nRBC: 0 % (ref 0.0–0.2)

## 2021-03-24 LAB — VITAMIN B12: Vitamin B-12: 426 pg/mL (ref 180–914)

## 2021-03-24 LAB — IRON AND IRON BINDING CAPACITY (CC-WL,HP ONLY)
Iron: 86 ug/dL (ref 28–170)
Saturation Ratios: 24 % (ref 10.4–31.8)
TIBC: 357 ug/dL (ref 250–450)
UIBC: 271 ug/dL (ref 148–442)

## 2021-03-24 LAB — FERRITIN: Ferritin: 45 ng/mL (ref 11–307)

## 2021-03-24 LAB — VITAMIN D 25 HYDROXY (VIT D DEFICIENCY, FRACTURES): Vit D, 25-Hydroxy: 36.05 ng/mL (ref 30–100)

## 2021-03-26 ENCOUNTER — Inpatient Hospital Stay: Payer: 59

## 2021-03-29 ENCOUNTER — Encounter: Payer: Self-pay | Admitting: Hematology and Oncology

## 2021-03-29 ENCOUNTER — Inpatient Hospital Stay (HOSPITAL_BASED_OUTPATIENT_CLINIC_OR_DEPARTMENT_OTHER): Payer: 59 | Admitting: Hematology and Oncology

## 2021-03-29 DIAGNOSIS — E559 Vitamin D deficiency, unspecified: Secondary | ICD-10-CM | POA: Diagnosis not present

## 2021-03-29 DIAGNOSIS — E538 Deficiency of other specified B group vitamins: Secondary | ICD-10-CM

## 2021-03-29 DIAGNOSIS — Z9884 Bariatric surgery status: Secondary | ICD-10-CM

## 2021-03-29 DIAGNOSIS — D508 Other iron deficiency anemias: Secondary | ICD-10-CM | POA: Diagnosis not present

## 2021-03-29 NOTE — Assessment & Plan Note (Signed)
Her vitamin B12 level is adequate She will continue vitamin B12 injection monthly

## 2021-03-29 NOTE — Assessment & Plan Note (Signed)
She has remote history of vitamin D deficiency She is on high-dose vitamin D replacement therapy She will continue the same

## 2021-03-29 NOTE — Progress Notes (Signed)
HEMATOLOGY-ONCOLOGY ELECTRONIC VISIT PROGRESS NOTE  Patient Care Team: Luetta Nutting, DO as PCP - General (Family Medicine) Janetta Hora, MD as Consulting Physician (Hematology) Moody Bruins, MD as Consulting Physician (Ophthalmology)  I connected with the patient via telephone conference and verified that I am speaking with the correct person using two identifiers. The patient's location is at home and I am providing care from the [facility] I discussed the limitations, risks, security and privacy concerns of performing an evaluation and management service by e-visits and the availability of in person appointments.  I also discussed with the patient that there may be a patient responsible charge related to this service. The patient expressed understanding and agreed to proceed.   ASSESSMENT & PLAN:  B12 deficiency Her vitamin B12 level is adequate She will continue vitamin B12 injection monthly  Iron deficiency anemia She had history of iron deficiency anemia due to malabsorption secondary to bariatric surgery She is not anemic and her iron studies are adequate Observe closely  Vitamin D deficiency She has remote history of vitamin D deficiency She is on high-dose vitamin D replacement therapy She will continue the same  H/O gastric bypass Due to her history of gastric bypass, she had unfortunately developed multiple mineral deficiencies I will set up another blood work and a visit in a few months and she is in agreement  Orders Placed This Encounter  Procedures   Iron and Iron Binding Capacity ([facility] only)    Standing Status:   Future    Standing Expiration Date:   03/29/2022   Ferritin    Standing Status:   Future    Standing Expiration Date:   03/29/2022   VITAMIN D 25 Hydroxy (Vit-D Deficiency, Fractures)    Standing Status:   Future    Standing Expiration Date:   03/29/2022   Vitamin B12    Standing Status:   Future    Standing Expiration Date:    03/29/2022   CBC with Differential (Cancer Center Only)    Standing Status:   Future    Standing Expiration Date:   03/29/2022   Copper, serum    Standing Status:   Future    Standing Expiration Date:   03/29/2022    INTERVAL HISTORY: Please see below for problem oriented charting. The purpose of today's discussion is to review test results The patient had bariatric surgery causing multiple mineral deficiencies She complained of excessive fatigue She is still not able to use her CPAP machine effectively She was recently prescribed prednisone therapy and muscle relaxant and is undergoing physical therapy for back pain  SUMMARY OF ONCOLOGIC HISTORY:  Kristy Sharp female is seen here because of history of multiple mineral deficiencies Patient had Roux-en-Y gastric bypass surgery around 2004 She could not remember how much weight she have lost but over the past few years, she has slowly regained her weight back She used to see a different hematologist in a different health system but had lost her job and insurance and subsequently relocated here to [place] She is currently working for a [employer], 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 She had not noticed any recent  bleeding such as epistaxis, hematuria or hematochezia The patient has used NSAID for chronic joint pain. She is not on antiplatelets agents.  She denies any pica and eats a variety of diet. She never donated blood or received blood transfusion She used to take vitamin B12 injections but have stopped; and then resumed She has received multiple doses of IV iron  Feraheme in the past few years  REVIEW OF SYSTEMS:   Constitutional: Denies fevers, chills or abnormal weight loss Eyes: Denies blurriness of vision Ears, nose, mouth, throat, and face: Denies mucositis or sore throat Respiratory: Denies cough, dyspnea or wheezes Cardiovascular: Denies palpitation, chest discomfort Gastrointestinal:  Denies nausea, heartburn or change in bowel habits Skin: Denies abnormal skin rashes Lymphatics: Denies new lymphadenopathy or easy bruising Neurological:Denies numbness, tingling or new weaknesses Behavioral/Psych: Mood is stable, no new changes  Extremities: No lower extremity edema All other systems were reviewed with the patient and are negative.  I have reviewed the past medical history, past surgical history, social history and family history with the patient and they are unchanged from previous note.  ALLERGIES:  has No Known Allergies.  MEDICATIONS:  Current Outpatient Medications  Medication Sig Dispense Refill   albuterol (PROVENTIL HFA) 108 (90 Base) MCG/ACT inhaler Inhale 2 puffs into the lungs every 6 (six) hours as needed for wheezing or shortness of breath. 20.1 g 2   Cholecalciferol (VITAMIN D PO) Take 2 capsules by mouth at bedtime. 10,000 units     citalopram (CELEXA) 40 MG tablet Take 1 tablet (40 mg total) by mouth daily. 90 tablet 1   cyanocobalamin (,VITAMIN B-12,) 1000 MCG/ML injection Take 1 mg injection weekly x 2 months, and then monthly 10 mL 11   diltiazem (CARDIZEM CD) 120 MG 24 hr capsule Take 120 mg by mouth daily.     folic acid (FOLVITE) 938 MCG tablet Take 400 mcg by mouth daily.     furosemide (LASIX) 20 MG tablet Take 1 tablet (20 mg total) by mouth daily as needed. (Patient taking differently: Take 20 mg by mouth daily as needed for edema.) 30 tablet 3   levothyroxine (SYNTHROID) 75 MCG tablet Take 75 mcg by mouth daily before breakfast.     LORazepam (ATIVAN) 1 MG tablet Take 1 tablet (1 mg total) by mouth daily as  needed for anxiety. 90 tablet 1   meloxicam (MOBIC) 15 MG tablet Take 1 tablet (15 mg total) by mouth daily as needed for pain. 90 tablet 1   methocarbamol (ROBAXIN) 500 MG tablet Take 1 tablet (500 mg total) by mouth every 6 (six) hours as needed for muscle spasms. 45 tablet 0   montelukast (SINGULAIR) 10 MG tablet Take 1 tablet (10 mg total) by mouth at bedtime. 90 tablet 1   Multiple Vitamins-Minerals (BARIATRIC FUSION) CHEW 1 chew PO daily (Patient taking differently: Chew 1 each by mouth daily. 1 chew PO daily) 90 tablet 3   omeprazole (PRILOSEC) 20 MG capsule TAKE 1 CAPSULE BY MOUTH TWICE A DAY BEFORE MEALS 60 capsule 1   PEG-KCl-NaCl-NaSulf-Na Asc-C (PLENVU) 140 g SOLR Take 140 g by mouth as directed. Manufacturer's coupon Universal coupon code:BIN: P2366821; GROUP: [id number]; PCN: [id number]; ID: [id number]; PAY NO MORE $50 1 each 0   Propylene Glycol (SYSTANE BALANCE) 0.6 % SOLN Place 1 drop into both eyes daily as needed (dry eyes).     traZODone (DESYREL) 50 MG tablet Take 1 tablet (50 mg total) by mouth at bedtime as needed for sleep. 90 tablet  1   Vitamin A 10000 units TABS Take 0.5 tablets (5,000 Units total) by mouth daily. (Patient taking differently: Take 10,000 Units by mouth daily.) 30 each 0   No current facility-administered medications for this visit.    PHYSICAL EXAMINATION: ECOG PERFORMANCE STATUS: 1 - Symptomatic but completely ambulatory  LABORATORY DATA:  I have reviewed the data as listed CMP Latest Ref Rng & Units 01/11/2021 09/03/2020 03/12/2018  Glucose 70 - 99 mg/dL 85 94 -  BUN 6 - 24 mg/dL 17 15 -  Creatinine 0.57 - 1.00 mg/dL 0.63 0.78 -  Sodium 134 - 144 mmol/L 141 142 -  Potassium 3.5 - 5.2 mmol/L 4.1 4.1 -  Chloride 96 - 106 mmol/L 105 108 -  CO2 20 - 29 mmol/L 24 25 -  Calcium 8.7 - 10.2 mg/dL 9.1 9.2 8.8  Total Protein 6.1 - 8.1 g/dL - 6.6 -  Total Bilirubin 0.2 - 1.2 mg/dL - 0.4 -  Alkaline Phos 38 - 126 U/L - - -  AST 10 - 35 U/L - 16 -  ALT 6 - 29  U/L - 9 -    Lab Results  Component Value Date   WBC 8.6 03/24/2021   HGB 13.0 03/24/2021   HCT 39.5 03/24/2021   MCV 97.3 03/24/2021   PLT 234 03/24/2021   NEUTROABS 6.8 03/24/2021     RADIOGRAPHIC STUDIES: I have personally reviewed the radiological images as listed and agreed with the findings in the report. DG Thoracic Spine W/Swimmers  Result Date: 03/24/2021 CLINICAL DATA:  Back pain. EXAM: THORACIC SPINE - 3 VIEWS COMPARISON:  None. FINDINGS: There is no acute fracture or subluxation of the thoracic spine. Multilevel degenerative changes with disc space narrowing and spurring. The visualized posterior elements are intact. The soft tissues are unremarkable. Right upper quadrant cholecystectomy clips. IMPRESSION: No acute/traumatic thoracic spine pathology. Electronically Signed   By: Anner Crete M.D.   On: 03/24/2021 14:17    I discussed the assessment and treatment plan with the patient. The patient was provided an opportunity to ask questions and all were answered. The patient agreed with the plan and demonstrated an understanding of the instructions. The patient was advised to call back or seek an in-person evaluation if the symptoms worsen or if the condition fails to improve as anticipated.    I spent 20 minutes for the appointment reviewing test results, discuss management and coordination of care.  Heath Lark, MD 03/29/2021 12:51 PM

## 2021-03-29 NOTE — Assessment & Plan Note (Signed)
Due to her history of gastric bypass, she had unfortunately developed multiple mineral deficiencies I will set up another blood work and a visit in a few months and she is in agreement

## 2021-03-29 NOTE — Assessment & Plan Note (Signed)
She had history of iron deficiency anemia due to malabsorption secondary to bariatric surgery She is not anemic and her iron studies are adequate Observe closely

## 2021-04-01 ENCOUNTER — Other Ambulatory Visit: Payer: Self-pay

## 2021-04-01 ENCOUNTER — Ambulatory Visit: Payer: 59 | Attending: Family Medicine | Admitting: Rehabilitative and Restorative Service Providers"

## 2021-04-01 ENCOUNTER — Encounter: Payer: Self-pay | Admitting: Rehabilitative and Restorative Service Providers"

## 2021-04-01 DIAGNOSIS — R29898 Other symptoms and signs involving the musculoskeletal system: Secondary | ICD-10-CM | POA: Insufficient documentation

## 2021-04-01 DIAGNOSIS — M546 Pain in thoracic spine: Secondary | ICD-10-CM | POA: Diagnosis not present

## 2021-04-01 DIAGNOSIS — R293 Abnormal posture: Secondary | ICD-10-CM | POA: Insufficient documentation

## 2021-04-01 DIAGNOSIS — M6281 Muscle weakness (generalized): Secondary | ICD-10-CM | POA: Diagnosis not present

## 2021-04-01 NOTE — Therapy (Signed)
[place], [place], [address] Phone: [phone]   Fax:  [phone]  Physical Therapy Evaluation  Patient Details  Name: Kristy Sharp MRN: [id number] Date of Birth: 07-29-1965 Referring Provider (PT): Dr Luetta Nutting   Encounter Date: 04/01/2021   PT End of Session - 04/01/21 1616     Visit Number 1    Number of Visits 12    Date for PT Re-Evaluation 05/13/21             Past Medical History:  Diagnosis Date   Allergic rhinitis due to other allergen    Allergy    Anemia    Anxiety    Asthma    B12 deficiency 04/27/2016   Bursitis, trochanteric    Cellulitis    Colon polyps    Depression    Gallstones    Headache    Heart palpitations    Hypercholesteremia    Hyperglycemia    Hypothyroidism due to Hashimoto's thyroiditis 07/04/2017   Insomnia    Iron deficiency anemia 04/27/2016   Morbid obesity ([place]) 07/04/2017   Obesity    Pneumonia    Right lumbar radiculopathy 05/02/2013   Sciatica    Sinusitis    Sleep apnea    CPAP   Vitamin B deficiency    Vitamin D deficiency     Past Surgical History:  Procedure Laterality Date   BIOPSY  01/12/2021   Procedure: BIOPSY;  Surgeon: Thornton Park, MD;  Location: [facility];  Service: Gastroenterology;;   CHOLECYSTECTOMY N/A 04/27/2012   Procedure: LAPAROSCOPIC CHOLECYSTECTOMY;  Surgeon: Donato Heinz, MD;  Location: [facility];  Service: General;  Laterality: N/A;   COLONOSCOPY WITH PROPOFOL N/A 01/12/2021   Procedure: COLONOSCOPY WITH PROPOFOL;  Surgeon: Thornton Park, MD;  Location: [facility];  Service: Gastroenterology;  Laterality: N/A;   ESOPHAGOGASTRODUODENOSCOPY (EGD) WITH PROPOFOL N/A 01/12/2021   Procedure: ESOPHAGOGASTRODUODENOSCOPY (EGD) WITH PROPOFOL;  Surgeon: Thornton Park, MD;  [facility];  Service: Gastroenterology;  Laterality: N/A;   EYE MUSCLE SURGERY Left    age 56   GASTRIC BYPASS      HEMOSTASIS CLIP PLACEMENT  01/12/2021   Procedure: HEMOSTASIS CLIP PLACEMENT;  Surgeon: Thornton Park, MD;  [facility];  Service: Gastroenterology;;   POLYPECTOMY  01/12/2021   Procedure: POLYPECTOMY;  Surgeon: Thornton Park, MD;  [facility];  Service: Gastroenterology;;   REFRACTIVE SURGERY Bilateral    WRIST SURGERY Left     There were no vitals filed for this visit.    Subjective Assessment - 04/01/21 1450     Subjective Patient reports that she has been having horrible pain in the Rt middle of her back with no known injury. It feels like a "tear, punch, pull". She was seen by MD and received treatment of medications with improvement. She does continue to have some pain and muscle spasms. Now the "whole back" is hurting some.    Pertinent History arthritis; anxiety; depression; obesity; lazy eye; fx ankles bilat; fx Lt elbow; gastric bypass surgery 2004; gall bladder sx 2014; LBP with injection ~ 25 yrs    Patient Stated Goals get rid of the back pain    Currently in Pain? Yes    Pain Score 6     Pain Location Thoracic    Pain Orientation Right    Pain Descriptors / Indicators Spasm;Burning   pinch   Pain Type Acute pain    Pain Radiating Towards at times radiating around  Rt side to ribs    Pain Onset More than a month ago    Pain Frequency Constant   variable in intensity   Aggravating Factors  reaching; lifting; not sure what irritates symptoms    Pain Relieving Factors meds; ice; heat; stretching - all temporary                [facility] PT Assessment - 04/01/21 0001       Assessment   Medical Diagnosis Rt thoracic pain    Referring Provider (PT) Dr Luetta Nutting    Onset Date/Surgical Date 02/25/21    Hand Dominance Right    Next MD Visit PRN    Prior Therapy none      Precautions   Precautions None      Restrictions   Weight Bearing Restrictions No      Balance Screen   Has the patient fallen in the past 6 months No    Has  the patient had a decrease in activity level because of a fear of falling?  No    Is the patient reluctant to leave their home because of a fear of falling?  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    Leisure household chores; cooking; sedentary- Psychologist, occupational      Observation/Other Assessments   Focus on Therapeutic Outcomes (FOTO)  37      Sensation   Additional Comments WFL's per pt report      Posture/Postural Control   Posture Comments head forward; shouders rounded and elevated; increased thoracic kyphosis      AROM   Cervical Flexion 56 pulling    Cervical Extension 62    Cervical - Right Side Bend 37    Cervical - Left Side Bend 40 pulling    Cervical - Right Rotation 74    Cervical - Left Rotation 75    Thoracic Flexion 80%    Thoracic Extension neutral    Thoracic - Right Side Bend 40% discomfort    Thoracic - Left Side Bend 20% pain    Thoracic - Right Rotation 30% pulling "twingy"    Thoracic - Left Rotation 25% pullling "twingy"      Strength   Overall Strength Comments 4+/5 posterior shoudler girdle      Palpation   Spinal mobility decreased spinal mobility in PA plane    Palpation comment muscular tightness and pain in the Rt thoracic paraspinals                        Objective measurements completed on examination: See above findings.       [facility] Adult PT Treatment/Exercise - 04/01/21 0001       Self-Care   ADL's discussed positions for sitting for work and when she is sitting in recliner after work    Posture working on posture and alignment in sitting and standing    Other Self-Care Comments  myofacial ball release work standing back to wall 4 in yellow ball      Neuro Re-ed    Neuro Re-ed Details  postural education       Neck Exercises: Seated   Neck Retraction 5 reps;5 secs    Cervical  Rotation Right;Left;5 reps    Lateral Flexion Right;Left;5 reps    W Back 10 reps    Money 10 reps;3 secs    Money Limitations L's in sitting      Shoulder Exercises: Seated   Other Seated Exercises sitting forward trunk flexion 30 sec x 3 reps to pt tolerance to stretch through thoracic spine    Other Seated Exercises lateral trunk flexion to Lt reaching over head with Rt UE to pt tolerance      Modalities   Modalities --   in sitting     Moist Heat Therapy   Number Minutes Moist Heat 10 Minutes    Moist Heat Location Shoulder;Other (comment)   Rt thoracic spine musculature     Electrical Stimulation   Electrical Stimulation Location Rt thoracic musculature    Electrical Stimulation Action TENS    Electrical Stimulation Parameters to tolerance    Electrical Stimulation Goals Pain;Tone                     PT Education - 04/01/21 1528     Education Details POC HEP DN posture    Person(s) Educated Patient    Methods Explanation;Demonstration;Tactile cues;Verbal cues;Handout    Comprehension Verbalized understanding;Returned demonstration;Verbal cues required;Tactile cues required                 PT Long Term Goals - 04/01/21 1632       PT LONG TERM GOAL #1   Title Decrease pain in the Rt thoracic spine area by 75-80% allowing patient to return to normal functional activities    Time 6    Period Weeks    Status New    Target Date 05/13/21      PT LONG TERM GOAL #2   Title Increase AROM cervical and thoracic spine to WFL's and pain free    Time 6    Period Weeks    Status New    Target Date 05/13/21      PT LONG TERM GOAL #3   Title Increased strength posterior shoulder girdle to 5/5 allowing patient to sit and stand with improved upright posture with posterior shoulder girdle engaged    Time 6    Period Weeks    Status New    Target Date 05/13/21      PT LONG TERM GOAL  #4   Title Independent in HEP (including aquatic therapy as indiecated)    Time 6    Period Weeks    Status New    Target Date 05/13/21      PT LONG TERM GOAL #5   Title Improve functional limitation score to 53    Time 6    Period Weeks    Status New    Target Date 05/13/21                    Plan - 04/01/21 1616     Clinical Impression Statement Patient presents with insidious onset of Rt thoracic pain ~ 1 month ago. She has poor posture and alignment; limited cervical and thoracic mobility/ROM; posterior shoulder girdle weakness; musular tightness to palpation through the Rt posterior thoracic spine and into the lateral Rt trunk; pain limiting work and functional activities as well as rest and sleep. Patient will benefit from PT to address problems identified.    Personal Factors and Comorbidities Fitness;Comorbidity 1;Behavior Pattern    Comorbidities obesity; arthritis    Stability/Clinical Decision Making Stable/Uncomplicated  Clinical Decision Making Low    Rehab Potential Good    PT Frequency 2x / week    PT Duration 6 weeks    PT Treatment/Interventions ADLs/Self Care Home Management;Aquatic Therapy;Cryotherapy;Electrical Stimulation;Iontophoresis 4mg /ml Dexamethasone;Moist Heat;Ultrasound;Therapeutic activities;Therapeutic exercise;Neuromuscular re-education;Balance training;Functional mobility training;Patient/family education;Manual techniques;Dry needling;Taping    PT Next Visit Plan review and progress with stretching and strengthening exercises for thoracic spine/postural musculature; DN vs manual work; modalities as indicated - stretch hip flexors    PT Home Exercise Plan [email or web address]    Consulted and Agree with Plan of Care Patient             Patient will benefit from skilled therapeutic intervention in order to improve the following deficits and impairments:  Decreased range of motion, Impaired UE functional use, Obesity, Decreased activity tolerance,  Pain, Hypomobility, Impaired flexibility, Improper body mechanics, Decreased mobility, Decreased strength, Postural dysfunction  Visit Diagnosis: Pain in thoracic spine  Other symptoms and signs involving the musculoskeletal system  Muscle weakness (generalized)  Abnormal posture     Problem List Patient Active Problem List   Diagnosis Date Noted   Moderate recurrent major depression ([place]) 03/23/2021   Acute right-sided thoracic back pain 03/23/2021   Globus sensation    Adenomatous polyp of ascending colon    Adenomatous polyp of sigmoid colon    Migraine with aura and without status migrainosus, not intractable 01/11/2021   Abnormal sensation of eye 10/22/2020   Oropharyngeal dysphagia 10/22/2020   Arthralgia 09/03/2020   Urinary frequency 09/03/2020   Obstructive sleep apnea syndrome, mild 04/20/2020   Laryngopharyngeal reflux 04/09/2020   Acute sinusitis 04/09/2020   Vitamin D deficiency 07/08/2019   Copper deficiency 07/08/2019   Chronic right-sided headache 03/12/2018   Vitamin A deficiency 03/12/2018   Edema of both lower extremities due to peripheral venous insufficiency 03/12/2018   Gastroesophageal reflux disease 03/12/2018   Right knee pain 03/08/2018   Chronic insomnia 03/08/2018   Chronic use of benzodiazepine for therapeutic purpose 03/08/2018   Chronic anxiety 03/08/2018   History of colon polyps 02/28/2018   Aura 02/28/2018   Hypothyroidism due to Hashimoto's thyroiditis 07/04/2017   Obesity, Class III, BMI 40-49.9 (morbid obesity) ([place]) 07/04/2017   H/O gastric bypass 04/27/2016   B12 deficiency 04/27/2016   Iron deficiency anemia 04/27/2016    Dewayne Severe Nilda Simmer, PT, MPH 04/01/2021, 4:37 PM  [facility] [place] [address] [place], [place], [address] Phone: [phone]   Fax:  [phone]  Name: Kristy Sharp MRN: [id number] Date of Birth: 07-11-1965

## 2021-04-01 NOTE — Patient Instructions (Signed)
Access Code: [email or web address] URL: [email or web address] Date: 04/01/2021 Prepared by: Gillermo Murdoch  Exercises Seated Forward Bending - 2 x daily - 7 x weekly - 1 sets - 3 reps - 30 sec hold Seated Sidebending - 2 x daily - 7 x weekly - 1 sets - 3 reps - 30 sec hold Seated Cervical Retraction - 2 x daily - 7 x weekly - 1-2 sets - 5-10 reps - 10 sec hold Seated Cervical Sidebending AROM - 2 x daily - 7 x weekly - 1 sets - 5 reps - 5-10 sec hold Seated Cervical Rotation AROM - 2 x daily - 7 x weekly - 1 sets - 5 reps - 2-3 sec hold Seated Scapular Retraction - 2 x daily - 7 x weekly - 1-2 sets - 10 reps - 10 sec hold Shoulder External Rotation and Scapular Retraction - 3 x daily - 7 x weekly - 1 sets - 10 reps - 3-5 sec hold Shoulder External Rotation in 45 Degrees Abduction - 2 x daily - 7 x weekly - 1-2 sets - 10 reps - 30 sec hold  Patient Education Regulatory affairs officer Trigger Point Dry Needling

## 2021-04-04 ENCOUNTER — Other Ambulatory Visit: Payer: Self-pay | Admitting: Cardiovascular Disease

## 2021-04-08 ENCOUNTER — Ambulatory Visit: Payer: 59 | Admitting: Rehabilitative and Restorative Service Providers"

## 2021-04-08 ENCOUNTER — Other Ambulatory Visit: Payer: Self-pay

## 2021-04-08 ENCOUNTER — Encounter: Payer: Self-pay | Admitting: Adult Health

## 2021-04-08 ENCOUNTER — Telehealth: Payer: 59 | Admitting: Adult Health

## 2021-04-08 ENCOUNTER — Encounter: Payer: Self-pay | Admitting: Rehabilitative and Restorative Service Providers"

## 2021-04-08 DIAGNOSIS — R293 Abnormal posture: Secondary | ICD-10-CM

## 2021-04-08 DIAGNOSIS — H539 Unspecified visual disturbance: Secondary | ICD-10-CM | POA: Diagnosis not present

## 2021-04-08 DIAGNOSIS — H579 Unspecified disorder of eye and adnexa: Secondary | ICD-10-CM

## 2021-04-08 DIAGNOSIS — G43109 Migraine with aura, not intractable, without status migrainosus: Secondary | ICD-10-CM

## 2021-04-08 DIAGNOSIS — R29898 Other symptoms and signs involving the musculoskeletal system: Secondary | ICD-10-CM

## 2021-04-08 DIAGNOSIS — R519 Headache, unspecified: Secondary | ICD-10-CM | POA: Diagnosis not present

## 2021-04-08 DIAGNOSIS — M546 Pain in thoracic spine: Secondary | ICD-10-CM

## 2021-04-08 DIAGNOSIS — R413 Other amnesia: Secondary | ICD-10-CM

## 2021-04-08 DIAGNOSIS — M6281 Muscle weakness (generalized): Secondary | ICD-10-CM

## 2021-04-08 NOTE — Therapy (Signed)
[place], [place], [address] Phone: [phone]   Fax:  [phone]  Physical Therapy Treatment  Patient Details  Name: Kristy Sharp MRN: [id number] Date of Birth: 1965/12/03 Referring Provider (PT): Dr Luetta Nutting   Encounter Date: 04/08/2021   PT End of Session - 04/08/21 1451     Visit Number 2    Number of Visits 12    Date for PT Re-Evaluation 05/13/21    PT Start Time 1446    PT Stop Time 4496    PT Time Calculation (min) 48 min    Activity Tolerance Patient tolerated treatment well             Past Medical History:  Diagnosis Date   Allergic rhinitis due to other allergen    Allergy    Anemia    Anxiety    Asthma    B12 deficiency 04/27/2016   Bursitis, trochanteric    Cellulitis    Colon polyps    Depression    Gallstones    Headache    Heart palpitations    Hypercholesteremia    Hyperglycemia    Hypothyroidism due to Hashimoto's thyroiditis 07/04/2017   Insomnia    Iron deficiency anemia 04/27/2016   Morbid obesity ([place]) 07/04/2017   Obesity    Pneumonia    Right lumbar radiculopathy 05/02/2013   Sciatica    Sinusitis    Sleep apnea    CPAP   Vitamin B deficiency    Vitamin D deficiency     Past Surgical History:  Procedure Laterality Date   BIOPSY  01/12/2021   Procedure: BIOPSY;  Surgeon: Thornton Park, MD;  Location: [facility];  Service: Gastroenterology;;   CHOLECYSTECTOMY N/A 04/27/2012   Procedure: LAPAROSCOPIC CHOLECYSTECTOMY;  Surgeon: Donato Heinz, MD;  Location: [facility];  Service: General;  Laterality: N/A;   COLONOSCOPY WITH PROPOFOL N/A 01/12/2021   Procedure: COLONOSCOPY WITH PROPOFOL;  Surgeon: Thornton Park, MD;  [facility];  Service: Gastroenterology;  Laterality: N/A;   ESOPHAGOGASTRODUODENOSCOPY (EGD) WITH PROPOFOL N/A 01/12/2021   Procedure: ESOPHAGOGASTRODUODENOSCOPY (EGD) WITH PROPOFOL;  Surgeon: Thornton Park, MD;  [facility];  Service: Gastroenterology;  Laterality: N/A;   EYE MUSCLE SURGERY Left    age 56   GASTRIC BYPASS     HEMOSTASIS CLIP PLACEMENT  01/12/2021   Procedure: HEMOSTASIS CLIP PLACEMENT;  Surgeon: Thornton Park, MD;  [facility];  Service: Gastroenterology;;   POLYPECTOMY  01/12/2021   Procedure: POLYPECTOMY;  Surgeon: Thornton Park, MD;  [facility];  Service: Gastroenterology;;   REFRACTIVE SURGERY Bilateral    WRIST SURGERY Left     There were no vitals filed for this visit.   Subjective Assessment - 04/08/21 1507     Subjective Patient reports that she has noticed some improvement. She has some aching and some "pinch" but muscle spasms have reduced. No longer taking muscle relaxants. Working on exercises at home.    Currently in Pain? Yes    Pain Score 4     Pain Location Thoracic    Pain Orientation Right    Pain Descriptors / Indicators Aching   pinching   Pain Type Acute pain    Pain Onset More than a month ago    Pain Frequency Constant                               [facility] Adult  PT Treatment/Exercise - 04/08/21 0001       Neck Exercises: Seated   Neck Retraction 5 reps;5 secs    Cervical Rotation Right;Left;5 reps    Lateral Flexion Right;Left;5 reps    W Back 10 reps    Money 10 reps;3 secs    Money Limitations L's in sitting      Shoulder Exercises: Seated   Retraction Strengthening;Both;10 reps;Theraband    Theraband Level (Shoulder Retraction) Level 2 (Red)      Shoulder Exercises: Stretch   Other Shoulder Stretches doorway lower 2 positions x 2 20 sec; higher position x 1 x 15 sec all to pt tolerance    Other Shoulder Stretches hip flexor stretch seated 3 reps x 20-30 sec hod      Moist Heat Therapy   Number Minutes Moist Heat 10 Minutes    Moist Heat Location Shoulder;Other (comment)   Rt thoracic spine musculature     Manual Therapy   Manual therapy comments skilled  palpation to assess response to Dn and manual work    Joint Mobilization thoracic mobs PA and lateral Grade I/II    Soft tissue mobilization deep tissue work through the thoracic spine area Rt > Lt    Myofascial Release thoracic paraspinals              Trigger Point Dry Needling - 04/08/21 0001     Consent Given? Yes    Education Handout Provided Yes    Thoracic multifidi response Palpable increased muscle length;Twitch response elicited                   PT Education - 04/08/21 1507     Education Details HEP; DN    Person(s) Educated Patient    Methods Explanation;Demonstration;Tactile cues;Verbal cues;Handout    Comprehension Verbalized understanding;Returned demonstration;Verbal cues required;Tactile cues required                 PT Long Term Goals - 04/01/21 1632       PT LONG TERM GOAL #1   Title Decrease pain in the Rt thoracic spine area by 75-80% allowing patient to return to normal functional activities    Time 6    Period Weeks    Status New    Target Date 05/13/21      PT LONG TERM GOAL #2   Title Increase AROM cervical and thoracic spine to WFL's and pain free    Time 6    Period Weeks    Status New    Target Date 05/13/21      PT LONG TERM GOAL #3   Title Increased strength posterior shoulder girdle to 5/5 allowing patient to sit and stand with improved upright posture with posterior shoulder girdle engaged    Time 6    Period Weeks    Status New    Target Date 05/13/21      PT LONG TERM GOAL #4   Title Independent in HEP (including aquatic therapy as indiecated)    Time 6    Period Weeks    Status New    Target Date 05/13/21      PT LONG TERM GOAL #5   Title Improve functional limitation score to 53    Time 6    Period Weeks    Status New    Target Date 05/13/21                   Plan - 04/08/21 1451  Clinical Impression Statement Improving. Less pain and decreased palpable tightness noted through the Rt  thoracic paraspinals. Good response to DN and manua work. Added stretching and strengthening.    Rehab Potential Good    PT Frequency 2x / week    PT Duration 6 weeks    PT Treatment/Interventions ADLs/Self Care Home Management;Aquatic Therapy;Cryotherapy;Electrical Stimulation;Iontophoresis 4mg /ml Dexamethasone;Moist Heat;Ultrasound;Therapeutic activities;Therapeutic exercise;Neuromuscular re-education;Balance training;Functional mobility training;Patient/family education;Manual techniques;Dry needling;Taping    PT Next Visit Plan review and progress with stretching and strengthening exercises for thoracic spine/postural musculature; DN vs manual work; modalities as indicated    PT Home Exercise Plan [place] and Agree with Plan of Care Patient             Patient will benefit from skilled therapeutic intervention in order to improve the following deficits and impairments:     Visit Diagnosis: Pain in thoracic spine  Other symptoms and signs involving the musculoskeletal system  Muscle weakness (generalized)  Abnormal posture     Problem List Patient Active Problem List   Diagnosis Date Noted   Moderate recurrent major depression ([place]) 03/23/2021   Acute right-sided thoracic back pain 03/23/2021   Globus sensation    Adenomatous polyp of ascending colon    Adenomatous polyp of sigmoid colon    Migraine with aura and without status migrainosus, not intractable 01/11/2021   Abnormal sensation of eye 10/22/2020   Oropharyngeal dysphagia 10/22/2020   Arthralgia 09/03/2020   Urinary frequency 09/03/2020   Obstructive sleep apnea syndrome, mild 04/20/2020   Laryngopharyngeal reflux 04/09/2020   Acute sinusitis 04/09/2020   Vitamin D deficiency 07/08/2019   Copper deficiency 07/08/2019   Chronic right-sided headache 03/12/2018   Vitamin A deficiency 03/12/2018   Edema of both lower extremities due to peripheral venous insufficiency 03/12/2018    Gastroesophageal reflux disease 03/12/2018   Right knee pain 03/08/2018   Chronic insomnia 03/08/2018   Chronic use of benzodiazepine for therapeutic purpose 03/08/2018   Chronic anxiety 03/08/2018   History of colon polyps 02/28/2018   Aura 02/28/2018   Hypothyroidism due to Hashimoto's thyroiditis 07/04/2017   Obesity, Class III, BMI 40-49.9 (morbid obesity) ([place]) 07/04/2017   H/O gastric bypass 04/27/2016   B12 deficiency 04/27/2016   Iron deficiency anemia 04/27/2016    Lazette Estala Nilda Simmer, PT, MPH 04/08/2021, 3:29 PM  [facility] [place] [address] [place], [place], [address] Phone: [phone]   Fax:  [phone]  Name: Kristy Sharp MRN: [id number] Date of Birth: [birth year]

## 2021-04-08 NOTE — Patient Instructions (Addendum)
Trigger Point Dry Needling  What is Trigger Point Dry Needling (DN)? DN is a physical therapy technique used to treat muscle pain and dysfunction. Specifically, DN helps deactivate muscle trigger points (muscle knots).  A thin filiform needle is used to penetrate the skin and stimulate the underlying trigger point. The goal is for a local twitch response (LTR) to occur and for the trigger point to relax. No medication of any kind is injected during the procedure.   What Does Trigger Point Dry Needling Feel Like?  The procedure feels different for each individual patient. Some patients report that they do not actually feel the needle enter the skin and overall the process is not painful. Very mild bleeding may occur. However, many patients feel a deep cramping in the muscle in which the needle was inserted. This is the local twitch response.   How Will I feel after the treatment? Soreness is normal, and the onset of soreness may not occur for a few hours. Typically this soreness does not last longer than two days.  Bruising is uncommon, however; ice can be used to decrease any possible bruising.  In rare cases feeling tired or nauseous after the treatment is normal. In addition, your symptoms may get worse before they get better, this period will typically not last longer than 24 hours.   What Can I do After My Treatment? Increase your hydration by drinking more water for the next 24 hours. You may place ice or heat on the areas treated that have become sore, however, do not use heat on inflamed or bruised areas. Heat often brings more relief post needling. You can continue your regular activities, but vigorous activity is not recommended initially after the treatment for 24 hours. DN is best combined with other physical therapy such as strengthening, stretching, and other therapies.    Access Code: [email or web address] URL: [email or web address] Date: 04/08/2021 Prepared by: Gillermo Murdoch  Exercises Seated Forward Bending - 2 x daily - 7 x weekly - 1 sets - 3 reps - 30 sec hold Seated Sidebending - 2 x daily - 7 x weekly - 1 sets - 3 reps - 30 sec hold Seated Cervical Retraction - 2 x daily - 7 x weekly - 1-2 sets - 5-10 reps - 10 sec hold Seated Cervical Sidebending AROM - 2 x daily - 7 x weekly - 1 sets - 5 reps - 5-10 sec hold Seated Cervical Rotation AROM - 2 x daily - 7 x weekly - 1 sets - 5 reps - 2-3 sec hold Seated Scapular Retraction - 2 x daily - 7 x weekly - 1-2 sets - 10 reps - 10 sec hold Shoulder External Rotation and Scapular Retraction - 3 x daily - 7 x weekly - 1 sets - 10 reps - 3-5 sec hold Shoulder External Rotation in 45 Degrees Abduction - 2 x daily - 7 x weekly - 1-2 sets - 10 reps - 30 sec hold Seated Hip Flexor Stretch - 2 x daily - 7 x weekly - 1 sets - 3 reps - 30 sec hold Doorway Pec Stretch at 60 Degrees Abduction - 3 x daily - 7 x weekly - 1 sets - 3 reps Doorway Pec Stretch at 90 Degrees Abduction - 3 x daily - 7 x weekly - 1 sets - 3 reps - 30 seconds hold Doorway Pec Stretch at 120 Degrees Abduction - 3 x daily - 7 x weekly - 1 sets - 3 reps - 30 second hold  hold Shoulder External Rotation and Scapular Retraction with Resistance - 2 x daily - 7 x weekly - 1 sets - 10 reps - 3-5 sec hold

## 2021-04-08 NOTE — Progress Notes (Signed)
[facility]    Provider:  Dr Jaynee Eagles Requesting Provider: Luetta Nutting, DO Primary Care Provider:  Luetta Nutting, DO   Virtual Visit via Video Note  Virtual visit completed through MyChart, a video enabled telemedicine application. Due to national recommendations of social distancing due to COVID-19, a virtual visit is felt to be most appropriate for this patient at this time. Reviewed limitations, risks, security and privacy concerns of performing a virtual visit and the availability of in person appointments. I also reviewed that there may be a patient responsible charge related to this service. The patient agreed to proceed.    Patient location: home Provider location: in office, [facility] Persons participating in this virtual visit: Patient and provider      CC:  multiple neurologic complaints   HPI:    Kristy Sharp is a 56 y.o. female here as requested by Luetta Nutting, DO for abnormal sensation of eye. PMHx b12 def, anemia, anxiety, migraines, b12 def, depression, headache, hyoerglycemia, hypothyroid, obesity, sleep apnea, gastric bypass, edema due to peripheral venous insufficiency, copper and vit d deficiency.   Update 04/08/2021 JM: Returns for follow-up visit via Leonardo video visit after prior visit with Dr. Jaynee Eagles 3 mo ago with multiple neurological complaints as below.  Completed MR brain which was unremarkable. Reports continued symptoms as discussed at prior visit without changes - continued morning headaches, eye pressure/tightness, intermittent blurred vision with "stars" in vision with mild headache, insomnia, poor sleep and poor memory. Reports better compliance on CPAP since around November after receiving a new mask but continues to have difficulty with tolerance. Is followed by Dr. Claiborne Billings. Using magnesium citrate for auras but no noticeable benefit.  Unable to identify specific triggers but does believe vision can be worse  with poor sleep.  Followed by oncology/hematology for B12 def, vit D def, iron deficiency anemia with improvement of B12 level currently at 426 (previously 109) and stable iron levels. routinely follows with PCP.  Reports routine follow-up with ophthalmology with routine visual exams.  No further concerns at this time.      Consult visit 01/11/2021 Dr. Jaynee Eagles:  For the last year she has a feeling around her eyes like they are swollen and tight. Her vision has been "weird" she went to the eye doctor and exam ws normal but still feel vision episodes of blurriness she has dry eye, she gets "stars" floating in her eyes and her vision is wavy and happens at work and she stops for 10 minutes and can't see, sometimes followed by a mild headache but most often some visual changes she is fine. When she was younger she used to get migraines a lot esp with chocolate as a trigger, but rare she gets a migraine, last time 6 months, vision changes started off an on for 2 years (maybe aura), not debilitating but she wants to know and it affects her vision she gets it 1x every 2-3 weeks, brief 10 minutes and goes away, never had that with her. migraines. She hit her head about 10 years ago on a car door and had a bump and she gets a stabby pain there (points to the occipital nerve). No ptosis, no facial weakness, no problems swallowing, no coughing, she had an upper Gi series and swallowing was fine, tomorrow having endoscopy. She always has tightness around ger eyes and swelling. Very annoying and bothersome, doesn't significantly impact life but can't drive with the aura. 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 She feels  her memory is poor. She follows closely with eye doctor, may be a remote hx of toxoplasmosis but stable scar in the left eye. She treats her sleep apnea, she just got her machine in August but struggling to find  mask been using it now for only a few weeks. +morning headaches and supine headaches. No other focal  neurologic deficits, associated symptoms, inciting events or modifiable factors.  Reviewed notes, labs and imaging from outside physicians, which showed:  12/2020: B12 : 109, D:36, cbc nml 08/2020 T4F normal  CT Maxillofacial: revieweed images and agree  Non-contiguous multidetector CT images of the paranasal sinuses were obtained in a single plane without contrast.   COMPARISON:  None.   FINDINGS: Paranasal sinuses well developed and clear. No significant mucosal edema. No air-fluid level. No acute skeletal abnormality.   IMPRESSION: Negative    Review of Systems: Patient complains of symptoms per HPI as well as the following symptoms obesity. Pertinent negatives and positives per HPI. All others negative.   Social History   Socioeconomic History   Marital status: Single    Spouse name: Not on file   Number of children: 0   Years of education: college   Highest education level: Not on file  Occupational History   Occupation: customer support agent    Comment: [employer]  Tobacco Use   Smoking status: Never    Passive exposure: Never   Smokeless tobacco: Never  Vaping Use   Vaping Use: Never used  Substance and Sexual Activity   Alcohol use: Yes    Alcohol/week: 1.0 standard drink    Types: 1 Cans of beer per week    Comment: occas.   Drug use: No   Sexual activity: Not Currently    Birth control/protection: Abstinence  Other Topics Concern   Not on file  Social History Narrative   Patient is single   Patient is right-handed.   Patient works full time at a call center    Education some college   Caffeine coffee two cups daily and some times one soda            Social Determinants of Health   Financial Resource Strain: Not on file  Food Insecurity: Not on file  Transportation Needs: Not on file  Physical Activity: Not on file  Stress: Not on file  Social Connections: Not on file  Intimate Partner Violence: Not on file    Family History  Problem  Relation Age of Onset   Lupus Mother    Atrial fibrillation Mother    Gout Mother    Colon polyps Mother    Lung cancer Father    Cancer - Other Sister        appendix   Colon cancer Maternal Grandmother 50   Heart attack Maternal Grandmother    Stroke Maternal Grandfather    Hepatitis Paternal Grandfather        blood transfusion    Past Medical History:  Diagnosis Date   Allergic rhinitis due to other allergen    Allergy    Anemia    Anxiety    Asthma    B12 deficiency 04/27/2016   Bursitis, trochanteric    Cellulitis    Colon polyps    Depression    Gallstones    Headache    Heart palpitations    Hypercholesteremia    Hyperglycemia    Hypothyroidism due to Hashimoto's thyroiditis 07/04/2017   Insomnia    Iron deficiency anemia 04/27/2016  Morbid obesity ([place]) 07/04/2017   Obesity    Pneumonia    Right lumbar radiculopathy 05/02/2013   Sciatica    Sinusitis    Sleep apnea    CPAP   Vitamin B deficiency    Vitamin D deficiency     Patient Active Problem List   Diagnosis Date Noted   Moderate recurrent major depression ([place]) 03/23/2021   Acute right-sided thoracic back pain 03/23/2021   Globus sensation    Adenomatous polyp of ascending colon    Adenomatous polyp of sigmoid colon    Migraine with aura and without status migrainosus, not intractable 01/11/2021   Abnormal sensation of eye 10/22/2020   Oropharyngeal dysphagia 10/22/2020   Arthralgia 09/03/2020   Urinary frequency 09/03/2020   Obstructive sleep apnea syndrome, mild 04/20/2020   Laryngopharyngeal reflux 04/09/2020   Acute sinusitis 04/09/2020   Vitamin D deficiency 07/08/2019   Copper deficiency 07/08/2019   Chronic right-sided headache 03/12/2018   Vitamin A deficiency 03/12/2018   Edema of both lower extremities due to peripheral venous insufficiency 03/12/2018   Gastroesophageal reflux disease 03/12/2018   Right knee pain 03/08/2018   Chronic insomnia 03/08/2018   Chronic use of  benzodiazepine for therapeutic purpose 03/08/2018   Chronic anxiety 03/08/2018   History of colon polyps 02/28/2018   Aura 02/28/2018   Hypothyroidism due to Hashimoto's thyroiditis 07/04/2017   Obesity, Class III, BMI 40-49.9 (morbid obesity) ([place]) 07/04/2017   H/O gastric bypass 04/27/2016   B12 deficiency 04/27/2016   Iron deficiency anemia 04/27/2016    Past Surgical History:  Procedure Laterality Date   BIOPSY  01/12/2021   Procedure: BIOPSY;  Surgeon: Thornton Park, MD;  Location: [facility];  Service: Gastroenterology;;   CHOLECYSTECTOMY N/A 04/27/2012   Procedure: LAPAROSCOPIC CHOLECYSTECTOMY;  Surgeon: Donato Heinz, MD;  Location: [facility];  Service: General;  Laterality: N/A;   COLONOSCOPY WITH PROPOFOL N/A 01/12/2021   Procedure: COLONOSCOPY WITH PROPOFOL;  Surgeon: Thornton Park, MD;  Location: [facility];  Service: Gastroenterology;  Laterality: N/A;   ESOPHAGOGASTRODUODENOSCOPY (EGD) WITH PROPOFOL N/A 01/12/2021   Procedure: ESOPHAGOGASTRODUODENOSCOPY (EGD) WITH PROPOFOL;  Surgeon: Thornton Park, MD;  Location: [facility];  Service: Gastroenterology;  Laterality: N/A;   EYE MUSCLE SURGERY Left    age 39   GASTRIC BYPASS     HEMOSTASIS CLIP PLACEMENT  01/12/2021   Procedure: HEMOSTASIS CLIP PLACEMENT;  Surgeon: Thornton Park, MD;  Location: [facility];  Service: Gastroenterology;;   POLYPECTOMY  01/12/2021   Procedure: POLYPECTOMY;  Surgeon: Thornton Park, MD;  Location: [facility];  Service: Gastroenterology;;   REFRACTIVE SURGERY Bilateral    WRIST SURGERY Left     Current Outpatient Medications  Medication Sig Dispense Refill   albuterol (PROVENTIL HFA) 108 (90 Base) MCG/ACT inhaler Inhale 2 puffs into the lungs every 6 (six) hours as needed for wheezing or shortness of breath. 20.1 g 2   Cholecalciferol (VITAMIN D PO) Take 2 capsules by mouth at bedtime. 10,000 units     citalopram (CELEXA) 40 MG tablet Take 1 tablet (40 mg total) by  mouth daily. 90 tablet 1   cyanocobalamin (,VITAMIN B-12,) 1000 MCG/ML injection Take 1 mg injection weekly x 2 months, and then monthly 10 mL 11   diltiazem (CARDIZEM CD) 120 MG 24 hr capsule Take 1 capsule (120 mg total) by mouth daily. Schedule an appointment with cardiology for further refills. 1st attempt 60 capsule 0   folic acid (FOLVITE) 546 MCG tablet Take 400 mcg by mouth daily.  furosemide (LASIX) 20 MG tablet Take 1 tablet (20 mg total) by mouth daily as needed. (Patient taking differently: Take 20 mg by mouth daily as needed for edema.) 30 tablet 3   levothyroxine (SYNTHROID) 75 MCG tablet Take 75 mcg by mouth daily before breakfast.     LORazepam (ATIVAN) 1 MG tablet Take 1 tablet (1 mg total) by mouth daily as needed for anxiety. 90 tablet 1   meloxicam (MOBIC) 15 MG tablet Take 1 tablet (15 mg total) by mouth daily as needed for pain. 90 tablet 1   methocarbamol (ROBAXIN) 500 MG tablet Take 1 tablet (500 mg total) by mouth every 6 (six) hours as needed for muscle spasms. 45 tablet 0   montelukast (SINGULAIR) 10 MG tablet Take 1 tablet (10 mg total) by mouth at bedtime. 90 tablet 1   Multiple Vitamins-Minerals (BARIATRIC FUSION) CHEW 1 chew PO daily (Patient taking differently: Chew 1 each by mouth daily. 1 chew PO daily) 90 tablet 3   omeprazole (PRILOSEC) 20 MG capsule TAKE 1 CAPSULE BY MOUTH TWICE A DAY BEFORE MEALS 60 capsule 1   PEG-KCl-NaCl-NaSulf-Na Asc-C (PLENVU) 140 g SOLR Take 140 g by mouth as directed. Manufacturer's coupon Universal coupon code:BIN: P2366821; GROUP: [id number]; PCN: [id number]; ID: [id number]; PAY NO MORE $50 1 each 0   Propylene Glycol (SYSTANE BALANCE) 0.6 % SOLN Place 1 drop into both eyes daily as needed (dry eyes).     traZODone (DESYREL) 50 MG tablet Take 1 tablet (50 mg total) by mouth at bedtime as needed for sleep. 90 tablet 1   Vitamin A 10000 units TABS Take 0.5 tablets (5,000 Units total) by mouth daily. (Patient taking differently: Take 10,000  Units by mouth daily.) 30 each 0   No current facility-administered medications for this visit.    Allergies as of 04/08/2021   (No Known Allergies)     Physical exam: Exam: N/A d/t visit type       Assessment/Plan:  Lovely patient 56 y.o. female here as requested by Luetta Nutting, DO for abnormal sensation of eye. PMHx b12 def, anemia, anxiety, migraines, b12 def, depression, headache, hyperglycemia, hypothyroid, obesity, sleep apnea, gastric bypass with resultant multiple mineral deficiencies, edema due to peripheral venous insufficiency, copper and vit d deficiency. She a has several concerns including a tightness/sensation about her eyes like they are swollen, vision changes (auras?), hx of migraine, mild headaches esp in the morning (likely hx sleep apnea is contributing), No ptosis, no facial weakness, no problems swallowing, no coughing, She always has tightness around her eyes and swelling.  She feels her memory is poor. . +morning headaches and supine headaches. No other focal neurologic deficits, associated symptoms, inciting events or modifiable factors. MR brain w/wo contrast 01/2021 unremarkable. Vit A, mag and BMP WNL.   Suspect multiple symptoms likely multifactorial as noted above -discussed importance of increasing nightly CPAP usage as this can improve above symptoms, continued routine follow-up with hematology for multiple mineral deficiencies and routine follow-up with ophthalmology. Currently using mag citrate for headaches/auras without benefit. Headaches not severe or debilitating therefore no indication for preventative therapy. No further recommendations or evaluation indicated from our standpoint but will confirm this with Dr. Jaynee Eagles. Can return to PCP for ongoing monitoring    CC:  [facility] provider: Dr. Reece Levy, Einar Pheasant, DO   I spent 22 minutes of face-to-face and non-face-to-face time with patient via MyChart video visit.  This included previsit chart review,  lab review, study review, electronic health record documentation,  patient education and discussion regarding above topics and discussion as above and answered all questions to patient satisfaction  Frann Rider, [facility]  [facility] [address] [place], [place] [address]  Phone [phone] Fax [phone] Note: This document was prepared with digital dictation and possible smart phrase technology. Any transcriptional errors that result from this process are unintentional.

## 2021-04-09 ENCOUNTER — Other Ambulatory Visit: Payer: Self-pay | Admitting: Family Medicine

## 2021-04-09 ENCOUNTER — Other Ambulatory Visit: Payer: Self-pay | Admitting: Gastroenterology

## 2021-04-09 DIAGNOSIS — F5104 Psychophysiologic insomnia: Secondary | ICD-10-CM

## 2021-04-09 DIAGNOSIS — K219 Gastro-esophageal reflux disease without esophagitis: Secondary | ICD-10-CM

## 2021-04-09 DIAGNOSIS — F419 Anxiety disorder, unspecified: Secondary | ICD-10-CM

## 2021-04-09 DIAGNOSIS — Z9884 Bariatric surgery status: Secondary | ICD-10-CM

## 2021-04-09 DIAGNOSIS — Z79899 Other long term (current) drug therapy: Secondary | ICD-10-CM

## 2021-04-09 DIAGNOSIS — M170 Bilateral primary osteoarthritis of knee: Secondary | ICD-10-CM

## 2021-04-14 NOTE — Progress Notes (Signed)
Cardiology Office Note:   Date:  04/15/2021  NAME:  Kristy Sharp    MRN: [id number] DOB:  Sep 22, 1965   PCP:  Luetta Nutting, DO  Cardiologist:  None  Electrophysiologist:  None   Referring MD: Luetta Nutting, DO   Chief Complaint  Patient presents with   Follow-up    6 months.   Edema    Legs and feet.    History of Present Illness:   Kristy Sharp is a 56 y.o. female with a hx of morbid obesity, sleep apnea, atrial tachycardia, venous insufficiency who presents for follow-up.  She reports she is doing fairly well.  Still having palpitations at night.  They occur daily.  Last seconds.  Apparently some of her chest tightness was attributed to acid reflux.  She is willing to retry metoprolol to see if this helps.  We discussed this would be okay.  Still has edema in her legs.  Improved with leg elevation.  Has known history of venous insufficiency.  She also is working with sleep medicine to get appropriate sleep treatment.  Has moderate sleep apnea.  Has not been able to tolerate any of the sleep mask.  We discussed weight loss medications including Wegovy.  I believe she should look into this.  She also reports that she will see a functional medicine specialist.  They may be able to help her with some of her other complaints.  She takes Lasix as needed.  Overall doing fairly well.  Blood pressure well controlled.  Palpitations do occur daily but are minimal.  Problem List 1. Morbid obesity  -BMI 65 -s/p bariatric surgery 2. B12 deficiency 3. Venous insufficiency  -bilateral great saphenous veins 4. Atrial tachycardia -Brief episodes ~8 seconds on zio  5. OSA -moderate 02/25/2020 6. Iron deficiency anemia   Past Medical History: Past Medical History:  Diagnosis Date   Allergic rhinitis due to other allergen    Allergy    Anemia    Anxiety    Asthma    B12 deficiency 04/27/2016   Bursitis, trochanteric    Cellulitis    Colon polyps    Depression    Gallstones    Headache     Heart palpitations    Hypercholesteremia    Hyperglycemia    Hypothyroidism due to Hashimoto's thyroiditis 07/04/2017   Insomnia    Iron deficiency anemia 04/27/2016   Morbid obesity ([place]) 07/04/2017   Obesity    Pneumonia    Right lumbar radiculopathy 05/02/2013   Sciatica    Sinusitis    Sleep apnea    CPAP   Vitamin B deficiency    Vitamin D deficiency     Past Surgical History: Past Surgical History:  Procedure Laterality Date   BIOPSY  01/12/2021   Procedure: BIOPSY;  Surgeon: [place] Park, MD;  Location: [facility];  Service: Gastroenterology;;   CHOLECYSTECTOMY N/A 04/27/2012   Procedure: LAPAROSCOPIC CHOLECYSTECTOMY;  Surgeon: Donato Heinz, MD;  Location: [facility];  Service: General;  Laterality: N/A;   COLONOSCOPY WITH PROPOFOL N/A 01/12/2021   Procedure: COLONOSCOPY WITH PROPOFOL;  Surgeon: [place] Park, MD;  Location: [facility];  Service: Gastroenterology;  Laterality: N/A;   ESOPHAGOGASTRODUODENOSCOPY (EGD) WITH PROPOFOL N/A 01/12/2021   Procedure: ESOPHAGOGASTRODUODENOSCOPY (EGD) WITH PROPOFOL;  Surgeon: [place] Park, MD;  Location: [facility];  Service: Gastroenterology;  Laterality: N/A;   EYE MUSCLE SURGERY Left    age 41   GASTRIC BYPASS     HEMOSTASIS CLIP PLACEMENT  01/12/2021   Procedure:  HEMOSTASIS CLIP PLACEMENT;  Surgeon: [place] Park, MD;  Location: [facility];  Service: Gastroenterology;;   POLYPECTOMY  01/12/2021   Procedure: POLYPECTOMY;  Surgeon: [place] Park, MD;  Location: [facility];  Service: Gastroenterology;;   REFRACTIVE SURGERY Bilateral    WRIST SURGERY Left     Current Medications: Current Meds  Medication Sig   albuterol (PROVENTIL HFA) 108 (90 Base) MCG/ACT inhaler Inhale 2 puffs into the lungs every 6 (six) hours as needed for wheezing or shortness of breath.   Cholecalciferol (VITAMIN D PO) Take 2 capsules by mouth at bedtime. 10,000 units   citalopram (CELEXA) 40 MG tablet Take 1 tablet (40  mg total) by mouth daily.   cyanocobalamin (,VITAMIN B-12,) 1000 MCG/ML injection Take 1 mg injection weekly x 2 months, and then monthly   folic acid (FOLVITE) 099 MCG tablet Take 400 mcg by mouth daily.   furosemide (LASIX) 20 MG tablet Take 1 tablet (20 mg total) by mouth daily as needed. (Patient taking differently: Take 20 mg by mouth daily as needed for edema.)   levothyroxine (SYNTHROID) 75 MCG tablet Take 75 mcg by mouth daily before breakfast.   LORazepam (ATIVAN) 1 MG tablet TAKE 1 TABLET BY MOUTH  DAILY AS NEEDED FOR ANXIETY   meloxicam (MOBIC) 15 MG tablet TAKE 1 TABLET BY MOUTH  DAILY AS NEEDED FOR PAIN   methocarbamol (ROBAXIN) 500 MG tablet Take 1 tablet (500 mg total) by mouth every 6 (six) hours as needed for muscle spasms.   metoprolol succinate (TOPROL XL) 25 MG 24 hr tablet Take 1 tablet (25 mg total) by mouth daily.   montelukast (SINGULAIR) 10 MG tablet Take 1 tablet (10 mg total) by mouth at bedtime.   Multiple Vitamins-Minerals (BARIATRIC FUSION) CHEW 1 chew PO daily (Patient taking differently: Chew 1 each by mouth daily. 1 chew PO daily)   omeprazole (PRILOSEC) 20 MG capsule TAKE 1 CAPSULE BY MOUTH TWICE A DAY BEFORE MEALS   PEG-KCl-NaCl-NaSulf-Na Asc-C (PLENVU) 140 g SOLR Take 140 g by mouth as directed. Manufacturer's coupon Universal coupon code:BIN: P2366821; GROUP: [id number]; PCN: [id number]; ID: [id number]; PAY NO MORE $50   Propylene Glycol (SYSTANE BALANCE) 0.6 % SOLN Place 1 drop into both eyes daily as needed (dry eyes).   traZODone (DESYREL) 50 MG tablet TAKE 1 TABLET BY MOUTH AT  BEDTIME AS NEEDED FOR SLEEP   Vitamin A 10000 units TABS Take 0.5 tablets (5,000 Units total) by mouth daily. (Patient taking differently: Take 10,000 Units by mouth daily.)   [DISCONTINUED] diltiazem (CARDIZEM CD) 120 MG 24 hr capsule Take 1 capsule (120 mg total) by mouth daily. Schedule an appointment with cardiology for further refills. 1st attempt     Allergies:    Patient has no  known allergies.   Social History: Social History   Socioeconomic History   Marital status: Single    Spouse name: Not on file   Number of children: 0   Years of education: college   Highest education level: Not on file  Occupational History   Occupation: customer support agent    Comment: [employer]  Tobacco Use   Smoking status: Never    Passive exposure: Never   Smokeless tobacco: Never  Vaping Use   Vaping Use: Never used  Substance and Sexual Activity   Alcohol use: Yes    Alcohol/week: 1.0 standard drink    Types: 1 Cans of beer per week    Comment: occas.   Drug use: No   Sexual activity:  Not Currently    Birth control/protection: Abstinence  Other Topics Concern   Not on file  Social History Narrative   Patient is single   Patient is right-handed.   Patient works full time at a call center    Education some college   Caffeine coffee two cups daily and some times one soda            Social Determinants of Radio broadcast assistant Strain: Not on file  Food Insecurity: Not on file  Transportation Needs: Not on file  Physical Activity: Not on file  Stress: Not on file  Social Connections: Not on file     Family History: The patient's family history includes Atrial fibrillation in her mother; Cancer - Other in her sister; Colon cancer (age of onset: 63) in her maternal grandmother; Colon polyps in her mother; Gout in her mother; Heart attack in her maternal grandmother; Hepatitis in her paternal grandfather; Lung cancer in her father; Lupus in her mother; Stroke in her maternal grandfather.  ROS:   All other ROS reviewed and negative. Pertinent positives noted in the HPI.     EKGs/Labs/Other Studies Reviewed:   The following studies were personally reviewed by me today:  TTE 01/30/2020  1. Left ventricular ejection fraction, by estimation, is 60 to 65%. The  left ventricle has normal function. The left ventricle has no regional  wall motion abnormalities.  Left ventricular diastolic parameters were  normal.   2. Right ventricular systolic function is normal. The right ventricular  size is normal.   3. Left atrial size was mildly dilated.   4. The mitral valve is normal in structure. Trivial mitral valve  regurgitation. No evidence of mitral stenosis.   5. The aortic valve is tricuspid. Aortic valve regurgitation is not  visualized. Mild aortic valve sclerosis is present, with no evidence of  aortic valve stenosis.   6. The inferior vena cava is normal in size with greater than 50%  respiratory variability, suggesting right atrial pressure of 3 mmHg.   Zio 01/09/2020 1. Brief ectopic atrial tachycardia episodes (22 episodes in 3 days; longest duration 7.8 seconds). 2. Symptoms occurred with PACs.  3. Rare PACs (<1%).    Recent Labs: 09/03/2020: ALT 9 01/11/2021: BUN 17; Creatinine, Ser 0.63; Magnesium 1.8; Potassium 4.1; Sodium 141 03/24/2021: Hemoglobin 13.0; Platelets 234   Recent Lipid Panel No results found for: CHOL, TRIG, HDL, CHOLHDL, VLDL, LDLCALC, LDLDIRECT  Physical Exam:   VS:  BP 110/82 (BP Location: Left Arm, Patient Position: Sitting, Cuff Size: Large)    Pulse 81    Ht 5\' 6"  (1.676 m)    Wt (!) 406 lb (184.2 kg)    BMI 65.53 kg/m    Wt Readings from Last 3 Encounters:  04/15/21 (!) 406 lb (184.2 kg)  03/23/21 (!) 409 lb (185.5 kg)  01/12/21 (!) 405 lb (183.7 kg)    General: Well nourished, well developed, in no acute distress Head: Atraumatic, normal size  Eyes: PEERLA, EOMI  Neck: Supple, no JVD Endocrine: No thryomegaly Cardiac: Normal S1, S2; RRR; no murmurs, rubs, or gallops Lungs: Clear to auscultation bilaterally, no wheezing, rhonchi or rales  Abd: Soft, nontender, no hepatomegaly  Ext: Trace edema, venous insufficiency noted Musculoskeletal: No deformities, BUE and BLE strength normal and equal Skin: Warm and dry, no rashes   Neuro: Alert and oriented to person, place, time, and situation, CNII-XII  grossly intact, no focal deficits  Psych: Normal mood and affect  ASSESSMENT:   Kristy Sharp is a 56 y.o. female who presents for the following: 1. Atrial tachycardia (HCC)   2. Palpitations   3. Obesity, morbid, BMI 50 or higher ([place])   4. Venous insufficiency     PLAN:   1. Atrial tachycardia (HCC) 2. Palpitations -Still having episodes of palpitations at night.  Occur daily.  Last seconds.  Has noticed improvement with diltiazem.  We switched her from metoprolol due to possible chest tightness.  She thought she had an allergy.  She would like to retry metoprolol.  We will try metoprolol succinate 25 mg daily.  Can take this twice a day as needed.  Stop diltiazem.  Echo was normal.  No other abnormalities noted on exam today. -Sleep apnea also is contributing.  Have asked her to really try to work with the mask she has.  She has tried several.  Hopefully she can find one that works.  I believe appropriate treatment of her sleep apnea will likely reduce her A. tach burden even.  3. Obesity, morbid, BMI 50 or higher ([place]) -Morbidly obese.  Has sleep apnea.  Recommended she discuss Wegovy with her primary care physician.  I think she would benefit from this.  She has had gastric bypass in the past.  4. Venous insufficiency -Lasix as needed.  Conservative approach with leg elevation.  Compression stockings also recommended.     Disposition: Return in about 1 year (around 04/15/2022).  Medication Adjustments/Labs and Tests Ordered: Current medicines are reviewed at length with the patient today.  Concerns regarding medicines are outlined above.  No orders of the defined types were placed in this encounter.  Meds ordered this encounter  Medications   metoprolol succinate (TOPROL XL) 25 MG 24 hr tablet    Sig: Take 1 tablet (25 mg total) by mouth daily.    Dispense:  90 tablet    Refill:  1    Patient Instructions  Medication Instructions:  STOP Diltazem  START Metoprolol  Succinate 25 mg daily   *If you need a refill on your cardiac medications before your next appointment, please call your pharmacy*   Follow-Up: At [facility], you and your health needs are our priority.  As part of our continuing mission to provide you with exceptional heart care, we have created designated Provider Care Teams.  These Care Teams include your primary Cardiologist (physician) and Advanced Practice Providers (APPs -  Physician Assistants and Nurse Practitioners) who all work together to provide you with the care you need, when you need it.  We recommend signing up for the patient portal called "MyChart".  Sign up information is provided on this After Visit Summary.  MyChart is used to connect with patients for Virtual Visits (Telemedicine).  Patients are able to view lab/test results, encounter notes, upcoming appointments, etc.  Non-urgent messages can be sent to your provider as well.   To learn more about what you can do with MyChart, go to [email or web address].    Your next appointment:   12 month(s)  The format for your next appointment:   In Person  Provider:   Eleonore Chiquito, MD      Time Spent with Patient: I have spent a total of 35 minutes with patient reviewing hospital notes, telemetry, EKGs, labs and examining the patient as well as establishing an assessment and plan that was discussed with the patient.  > 50% of time was spent in direct patient care.  Signed, Addison Naegeli. Audie Box, MD, [facility]  [facility]  [address] [place], [place] [address] [phone]  04/15/2021 12:35 PM

## 2021-04-15 ENCOUNTER — Ambulatory Visit: Payer: 59 | Admitting: Cardiovascular Disease

## 2021-04-15 ENCOUNTER — Ambulatory Visit: Payer: 59 | Admitting: Rehabilitative and Restorative Service Providers"

## 2021-04-15 ENCOUNTER — Other Ambulatory Visit: Payer: Self-pay

## 2021-04-15 ENCOUNTER — Telehealth: Payer: 59 | Admitting: Adult Health

## 2021-04-15 ENCOUNTER — Encounter: Payer: Self-pay | Admitting: Cardiovascular Disease

## 2021-04-15 VITALS — BP 110/82 | HR 81 | Ht 66.0 in | Wt >= 6400 oz

## 2021-04-15 DIAGNOSIS — R002 Palpitations: Secondary | ICD-10-CM | POA: Diagnosis not present

## 2021-04-15 DIAGNOSIS — I471 Supraventricular tachycardia: Secondary | ICD-10-CM | POA: Diagnosis not present

## 2021-04-15 DIAGNOSIS — I872 Venous insufficiency (chronic) (peripheral): Secondary | ICD-10-CM

## 2021-04-15 MED ORDER — METOPROLOL SUCCINATE ER 25 MG PO TB24
25.0000 mg | ORAL_TABLET | Freq: Every day | ORAL | 1 refills | Status: DC
Start: 2021-04-15 — End: 2021-10-18

## 2021-04-15 NOTE — Patient Instructions (Signed)
Medication Instructions:  STOP Diltazem  START Metoprolol Succinate 25 mg daily   *If you need a refill on your cardiac medications before your next appointment, please call your pharmacy*   Follow-Up: At [facility], you and your health needs are our priority.  As part of our continuing mission to provide you with exceptional heart care, we have created designated Provider Care Teams.  These Care Teams include your primary Cardiologist (physician) and Advanced Practice Providers (APPs -  Physician Assistants and Nurse Practitioners) who all work together to provide you with the care you need, when you need it.  We recommend signing up for the patient portal called "MyChart".  Sign up information is provided on this After Visit Summary.  MyChart is used to connect with patients for Virtual Visits (Telemedicine).  Patients are able to view lab/test results, encounter notes, upcoming appointments, etc.  Non-urgent messages can be sent to your provider as well.   To learn more about what you can do with MyChart, go to [email or web address].    Your next appointment:   12 month(s)  The format for your next appointment:   In Person  Provider:   Eleonore Chiquito, MD

## 2021-04-22 ENCOUNTER — Ambulatory Visit: Payer: 59 | Admitting: Rehabilitative and Restorative Service Providers"

## 2021-04-28 ENCOUNTER — Encounter: Payer: Self-pay | Admitting: Family Medicine

## 2021-04-28 MED ORDER — LEVOTHYROXINE SODIUM 75 MCG PO TABS: 75.0000 ug | ORAL_TABLET | Freq: Every day | ORAL | 1 refills | Status: DC

## 2021-04-28 NOTE — Telephone Encounter (Signed)
We have not prescribed these medications for the patient previously.  Please review and refill if appropriate.  T. Brandyn Lowrey, CMA  

## 2021-04-29 ENCOUNTER — Ambulatory Visit: Payer: 59 | Admitting: Rehabilitative and Restorative Service Providers"

## 2021-06-15 ENCOUNTER — Other Ambulatory Visit: Payer: Self-pay | Admitting: Family Medicine

## 2021-06-15 DIAGNOSIS — F419 Anxiety disorder, unspecified: Secondary | ICD-10-CM

## 2021-06-15 DIAGNOSIS — J3089 Other allergic rhinitis: Secondary | ICD-10-CM

## 2021-06-18 ENCOUNTER — Other Ambulatory Visit: Payer: Self-pay | Admitting: Gastroenterology

## 2021-06-18 DIAGNOSIS — Z9884 Bariatric surgery status: Secondary | ICD-10-CM

## 2021-06-18 DIAGNOSIS — K219 Gastro-esophageal reflux disease without esophagitis: Secondary | ICD-10-CM

## 2021-07-15 ENCOUNTER — Ambulatory Visit: Payer: 59 | Admitting: Cardiovascular Disease

## 2021-07-22 ENCOUNTER — Other Ambulatory Visit: Payer: 59

## 2021-07-23 ENCOUNTER — Other Ambulatory Visit: Payer: Self-pay

## 2021-07-23 ENCOUNTER — Inpatient Hospital Stay: Payer: 59 | Attending: Hematology and Oncology

## 2021-07-23 DIAGNOSIS — E610 Copper deficiency: Secondary | ICD-10-CM | POA: Diagnosis not present

## 2021-07-23 DIAGNOSIS — E559 Vitamin D deficiency, unspecified: Secondary | ICD-10-CM | POA: Diagnosis not present

## 2021-07-23 DIAGNOSIS — Z79899 Other long term (current) drug therapy: Secondary | ICD-10-CM | POA: Diagnosis not present

## 2021-07-23 DIAGNOSIS — E611 Iron deficiency: Secondary | ICD-10-CM | POA: Insufficient documentation

## 2021-07-23 DIAGNOSIS — Z9884 Bariatric surgery status: Secondary | ICD-10-CM | POA: Diagnosis not present

## 2021-07-23 DIAGNOSIS — E538 Deficiency of other specified B group vitamins: Secondary | ICD-10-CM | POA: Insufficient documentation

## 2021-07-23 DIAGNOSIS — D508 Other iron deficiency anemias: Secondary | ICD-10-CM

## 2021-07-23 LAB — CBC WITH DIFFERENTIAL (CANCER CENTER ONLY)
Abs Immature Granulocytes: 0.01 10*3/uL (ref 0.00–0.07)
Basophils Absolute: 0 10*3/uL (ref 0.0–0.1)
Basophils Relative: 1 %
Eosinophils Absolute: 0.1 10*3/uL (ref 0.0–0.5)
Eosinophils Relative: 2 %
HCT: 41.7 % (ref 36.0–46.0)
Hemoglobin: 13.5 g/dL (ref 12.0–15.0)
Immature Granulocytes: 0 %
Lymphocytes Relative: 31 %
Lymphs Abs: 1.5 10*3/uL (ref 0.7–4.0)
MCH: 31.5 pg (ref 26.0–34.0)
MCHC: 32.4 g/dL (ref 30.0–36.0)
MCV: 97.4 fL (ref 80.0–100.0)
Monocytes Absolute: 0.4 10*3/uL (ref 0.1–1.0)
Monocytes Relative: 9 %
Neutro Abs: 2.8 10*3/uL (ref 1.7–7.7)
Neutrophils Relative %: 57 %
Platelet Count: 220 10*3/uL (ref 150–400)
RBC: 4.28 MIL/uL (ref 3.87–5.11)
RDW: 13.8 % (ref 11.5–15.5)
WBC Count: 4.9 10*3/uL (ref 4.0–10.5)
nRBC: 0 % (ref 0.0–0.2)

## 2021-07-23 LAB — FERRITIN: Ferritin: 33 ng/mL (ref 11–307)

## 2021-07-23 LAB — VITAMIN B12: Vitamin B-12: 595 pg/mL (ref 180–914)

## 2021-07-23 LAB — IRON AND IRON BINDING CAPACITY (CC-WL,HP ONLY)
Iron: 123 ug/dL (ref 28–170)
Saturation Ratios: 34 % — ABNORMAL HIGH (ref 10.4–31.8)
TIBC: 365 ug/dL (ref 250–450)
UIBC: 242 ug/dL

## 2021-07-23 LAB — VITAMIN D 25 HYDROXY (VIT D DEFICIENCY, FRACTURES): Vit D, 25-Hydroxy: 47.9 ng/mL (ref 30–100)

## 2021-07-28 LAB — COPPER, SERUM: Copper: 131 ug/dL (ref 80–158)

## 2021-07-29 ENCOUNTER — Other Ambulatory Visit: Payer: Self-pay | Admitting: Hematology and Oncology

## 2021-07-29 ENCOUNTER — Encounter: Payer: Self-pay | Admitting: Hematology and Oncology

## 2021-07-29 ENCOUNTER — Inpatient Hospital Stay: Payer: 59 | Admitting: Hematology and Oncology

## 2021-07-29 DIAGNOSIS — E538 Deficiency of other specified B group vitamins: Secondary | ICD-10-CM | POA: Diagnosis not present

## 2021-07-29 DIAGNOSIS — E610 Copper deficiency: Secondary | ICD-10-CM | POA: Diagnosis not present

## 2021-07-29 DIAGNOSIS — D508 Other iron deficiency anemias: Secondary | ICD-10-CM

## 2021-07-29 DIAGNOSIS — E559 Vitamin D deficiency, unspecified: Secondary | ICD-10-CM

## 2021-07-29 DIAGNOSIS — Z9884 Bariatric surgery status: Secondary | ICD-10-CM

## 2021-07-29 NOTE — Progress Notes (Signed)
HEMATOLOGY-ONCOLOGY ELECTRONIC VISIT PROGRESS NOTE  Patient Care Team: Luetta Nutting, DO as PCP - General (Family Medicine) Janetta Hora, MD as Consulting Physician (Hematology) Moody Bruins, MD as Consulting Physician (Ophthalmology)  I connected with the patient via telephone conference and verified that I am speaking with the correct person using two identifiers. The patient's location is at home and I am providing care from the [facility] I discussed the limitations, risks, security and privacy concerns of performing an evaluation and management service by e-visits and the availability of in person appointments.  I also discussed with the patient that there may be a patient responsible charge related to this service. The patient expressed understanding and agreed to proceed.   ASSESSMENT & PLAN:  B12 deficiency Her vitamin B12 level is adequate She will continue vitamin B12 injection monthly  Copper deficiency Serum copper level is adequate.  Monitor closely  Iron deficiency anemia She had history of iron deficiency anemia due to malabsorption secondary to bariatric surgery She is not anemic and her iron studies are borderline low However, her insurance coverage has not been stellar For now, we will not proceed with IV iron in the absence of anemia Observe closely  Vitamin D deficiency She has remote history of vitamin D deficiency She is on high-dose vitamin D replacement therapy She will continue the same  Obesity, Class III, BMI 40-49.9 (morbid obesity) ([place]) Due to her history of gastric bypass, she had unfortunately developed multiple mineral deficiencies We discussed the risk and benefit of follow-up here versus with primary care doctor I will set up another blood work and a visit in a few months and she is in agreement   Orders Placed This Encounter  Procedures   CBC with Differential ([place] Only)    Standing Status:   Future     Standing Expiration Date:   07/30/2022   Ferritin    Standing Status:   Future    Standing Expiration Date:   07/29/2022   Iron and Iron Binding Capacity ([facility] only)    Standing Status:   Future    Standing Expiration Date:   07/30/2022   VITAMIN D 25 Hydroxy (Vit-D Deficiency, Fractures)    Standing Status:   Future    Standing Expiration Date:   07/30/2022   Vitamin B12    Standing Status:   Future    Standing Expiration Date:   07/30/2022   Copper, serum    Standing Status:   Future    Standing Expiration Date:   07/29/2022    INTERVAL HISTORY: Please see below for problem oriented charting. The purpose of today's discussion is to review recent blood work She feels well today   SUMMARY OF ONCOLOGIC HISTORY:  Kristy Sharp female is seen here because of history of multiple mineral deficiencies Patient had Roux-en-Y gastric bypass surgery around 2004 She could not remember how much weight she have lost but over the past few years, she has slowly regained her weight back She used to see a different hematologist in a different health system but had lost her job and insurance and subsequently relocated here to [facility] She is currently working for a [employer], 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 She had not noticed any recent bleeding such as epistaxis, hematuria or hematochezia The patient has used NSAID for chronic joint pain. She is not on antiplatelets agents.  She denies any pica and eats a variety of diet. She never donated blood or received blood transfusion She used to take vitamin B12 injections but have  stopped; and then resumed She has received multiple doses of IV iron Feraheme in the past few years Her insurance has not been covering her IV iron infusion well  REVIEW OF SYSTEMS:   Constitutional: Denies fevers, chills or abnormal weight loss Eyes: Denies blurriness of vision Ears, nose, mouth, throat, and face: Denies mucositis or sore throat Respiratory: Denies cough, dyspnea or wheezes Cardiovascular: Denies palpitation, chest discomfort Gastrointestinal:  Denies nausea, heartburn or change in bowel habits Skin: Denies abnormal skin rashes Lymphatics: Denies new lymphadenopathy or easy bruising Neurological:Denies numbness, tingling or new weaknesses Behavioral/Psych: Mood is stable, no new changes  Extremities: No lower extremity edema All other systems were reviewed with the patient and are negative.  I have reviewed the past medical history, past surgical history, social history and family history with the patient and they are unchanged from previous note.  ALLERGIES:  has No Known Allergies.  MEDICATIONS:  Current Outpatient Medications  Medication Sig Dispense Refill   albuterol (PROVENTIL HFA) 108 (90 Base) MCG/ACT inhaler Inhale 2 puffs into the lungs every 6 (six) hours as needed for wheezing or shortness of breath. 20.1 g 2   Cholecalciferol (VITAMIN D PO) Take 2 capsules by mouth at bedtime. 10,000 units     citalopram (CELEXA) 40 MG tablet TAKE 1 TABLET BY MOUTH  DAILY 90 tablet 3   cyanocobalamin (,VITAMIN B-12,) 1000 MCG/ML injection Take 1 mg injection weekly x 2 months, and then monthly 10 mL 11   folic acid (FOLVITE) 604 MCG tablet Take 400 mcg by mouth daily.     furosemide (LASIX) 20 MG tablet Take 1 tablet (20 mg total) by mouth daily as needed. (Patient taking differently: Take 20 mg by mouth daily as needed for edema.) 30 tablet 3   levothyroxine (SYNTHROID) 75 MCG tablet Take 1 tablet (75 mcg total) by mouth daily before breakfast. 90 tablet 1   LORazepam  (ATIVAN) 1 MG tablet TAKE 1 TABLET BY MOUTH  DAILY AS NEEDED FOR ANXIETY 90 tablet 1   meloxicam (MOBIC) 15 MG tablet TAKE 1 TABLET BY MOUTH  DAILY AS NEEDED FOR PAIN 90 tablet 3   metoprolol succinate (TOPROL XL) 25 MG 24 hr tablet Take 1 tablet (25 mg total) by mouth daily. 90 tablet 1   montelukast (SINGULAIR) 10 MG tablet TAKE 1 TABLET BY MOUTH AT  BEDTIME 90 tablet 3   omeprazole (PRILOSEC) 20 MG capsule TAKE 1 CAPSULE BY MOUTH TWICE A DAY BEFORE MEALS 60 capsule 1   Propylene Glycol (SYSTANE BALANCE) 0.6 % SOLN Place 1 drop into both eyes daily as needed (dry eyes).     traZODone (DESYREL) 50 MG tablet TAKE 1 TABLET BY MOUTH AT  BEDTIME AS NEEDED FOR SLEEP 90 tablet 3   Vitamin A 10000 units TABS Take 0.5 tablets (5,000 Units total) by mouth daily. (Patient taking differently: Take 10,000 Units by mouth daily.) 30 each 0   No current facility-administered medications for this visit.    PHYSICAL EXAMINATION: ECOG PERFORMANCE STATUS: 1 - Symptomatic but completely ambulatory  LABORATORY DATA:  I have reviewed the  data as listed    Latest Ref Rng & Units 01/11/2021    8:31 AM 09/03/2020   12:00 AM 03/12/2018   11:09 AM  CMP  Glucose 70 - 99 mg/dL 85  94  95   BUN 6 - 24 mg/dL '17  15  15   '$ Creatinine 0.57 - 1.00 mg/dL 0.63  0.78  0.62   Sodium 134 - 144 mmol/L 141  142  141   Potassium 3.5 - 5.2 mmol/L 4.1  4.1  4.1   Chloride 96 - 106 mmol/L 105  108  110   CO2 20 - 29 mmol/L '24  25  24   '$ Calcium 8.7 - 10.2 mg/dL 9.1  9.2  8.8    8.8   Total Protein 6.1 - 8.1 g/dL  6.6  6.0   Total Bilirubin 0.2 - 1.2 mg/dL  0.4  0.4   AST 10 - 35 U/L  16  16   ALT 6 - 29 U/L  9  8     Lab Results  Component Value Date   WBC 4.9 07/23/2021   HGB 13.5 07/23/2021   HCT 41.7 07/23/2021   MCV 97.4 07/23/2021   PLT 220 07/23/2021   NEUTROABS 2.8 07/23/2021    I discussed the assessment and treatment plan with the patient. The patient was provided an opportunity to ask questions and all  were answered. The patient agreed with the plan and demonstrated an understanding of the instructions. The patient was advised to call back or seek an in-person evaluation if the symptoms worsen or if the condition fails to improve as anticipated.    I spent 20 minutes for the appointment reviewing test results, discuss management and coordination of care.  Heath Lark, MD 07/30/2021 7:48 AM

## 2021-07-30 NOTE — Assessment & Plan Note (Signed)
Serum copper level is adequate.  Monitor closely

## 2021-07-30 NOTE — Assessment & Plan Note (Signed)
Her vitamin B12 level is adequate She will continue vitamin B12 injection monthly

## 2021-07-30 NOTE — Assessment & Plan Note (Signed)
She had history of iron deficiency anemia due to malabsorption secondary to bariatric surgery She is not anemic and her iron studies are borderline low However, her insurance coverage has not been stellar For now, we will not proceed with IV iron in the absence of anemia Observe closely

## 2021-07-30 NOTE — Assessment & Plan Note (Signed)
Due to her history of gastric bypass, she had unfortunately developed multiple mineral deficiencies We discussed the risk and benefit of follow-up here versus with primary care doctor I will set up another blood work and a visit in a few months and she is in agreement

## 2021-07-30 NOTE — Assessment & Plan Note (Signed)
She has remote history of vitamin D deficiency She is on high-dose vitamin D replacement therapy She will continue the same

## 2021-08-18 ENCOUNTER — Other Ambulatory Visit: Payer: Self-pay | Admitting: Gastroenterology

## 2021-08-18 DIAGNOSIS — Z9884 Bariatric surgery status: Secondary | ICD-10-CM

## 2021-08-18 DIAGNOSIS — K219 Gastro-esophageal reflux disease without esophagitis: Secondary | ICD-10-CM

## 2021-10-17 ENCOUNTER — Other Ambulatory Visit: Payer: Self-pay | Admitting: Cardiovascular Disease

## 2021-11-14 ENCOUNTER — Other Ambulatory Visit: Payer: Self-pay | Admitting: Gastroenterology

## 2021-11-14 DIAGNOSIS — K219 Gastro-esophageal reflux disease without esophagitis: Secondary | ICD-10-CM

## 2021-11-14 DIAGNOSIS — Z9884 Bariatric surgery status: Secondary | ICD-10-CM

## 2021-11-15 ENCOUNTER — Other Ambulatory Visit: Payer: Self-pay | Admitting: Family Medicine

## 2021-11-15 NOTE — Telephone Encounter (Signed)
Overdue for visit.  Ok to fill x30 days.

## 2021-11-15 NOTE — Telephone Encounter (Signed)
Pt refused to make an appointment with me. Pt stated that she will get back to [place]. Tvt

## 2021-11-15 NOTE — Telephone Encounter (Signed)
Please contact the patient to schedule an appointment. Thanks

## 2021-11-16 ENCOUNTER — Other Ambulatory Visit: Payer: Self-pay | Admitting: Family Medicine

## 2021-11-16 DIAGNOSIS — Z9884 Bariatric surgery status: Secondary | ICD-10-CM

## 2021-11-16 DIAGNOSIS — K219 Gastro-esophageal reflux disease without esophagitis: Secondary | ICD-10-CM

## 2021-11-29 ENCOUNTER — Encounter: Payer: Self-pay | Admitting: Family Medicine

## 2021-11-29 MED ORDER — LEVOTHYROXINE SODIUM 75 MCG PO TABS: 75.0000 ug | ORAL_TABLET | Freq: Every day | ORAL | 0 refills | Status: DC

## 2021-11-30 ENCOUNTER — Ambulatory Visit: Payer: 59 | Admitting: Family Medicine

## 2021-11-30 ENCOUNTER — Encounter: Payer: Self-pay | Admitting: Family Medicine

## 2021-11-30 VITALS — BP 114/62 | HR 74 | Temp 98.1°F | Ht 66.0 in | Wt 391.0 lb

## 2021-11-30 DIAGNOSIS — E038 Other specified hypothyroidism: Secondary | ICD-10-CM

## 2021-11-30 DIAGNOSIS — B349 Viral infection, unspecified: Secondary | ICD-10-CM

## 2021-11-30 DIAGNOSIS — R0981 Nasal congestion: Secondary | ICD-10-CM

## 2021-11-30 DIAGNOSIS — R051 Acute cough: Secondary | ICD-10-CM | POA: Diagnosis not present

## 2021-11-30 DIAGNOSIS — Z20822 Contact with and (suspected) exposure to covid-19: Secondary | ICD-10-CM | POA: Diagnosis not present

## 2021-11-30 DIAGNOSIS — E063 Autoimmune thyroiditis: Secondary | ICD-10-CM

## 2021-11-30 LAB — POC COVID19 BINAXNOW: SARS Coronavirus 2 Ag: NEGATIVE

## 2021-11-30 LAB — POCT INFLUENZA A/B
Influenza A, POC: NEGATIVE
Influenza B, POC: NEGATIVE

## 2021-11-30 MED ORDER — LEVOTHYROXINE SODIUM 88 MCG PO TABS: 88.0000 ug | ORAL_TABLET | Freq: Every day | ORAL | 0 refills | Status: DC

## 2021-11-30 MED ORDER — ONDANSETRON 4 MG PO TBDP: 4.0000 mg | ORAL_TABLET | Freq: Three times a day (TID) | ORAL | 0 refills | Status: AC | PRN

## 2021-11-30 NOTE — Assessment & Plan Note (Signed)
TSH is mildly elevated.  Recommend that she increase her levothyroxine to 88 mcg.  Increased strength sent into the pharmacy.  She will discuss possible change to Armour Thyroid with her functional medicine provider however I recommended that she remain on levothyroxine.

## 2021-11-30 NOTE — Progress Notes (Signed)
Kristy Sharp - 56 y.o. female MRN [id number]  Date of birth: 12-Jul-1965  Subjective Chief Complaint  Patient presents with   URI    HPI  Kristy Sharp is a 56 year old female here today with complaint of of body aches, fatigue, chest congestion, headache nausea vomiting and diarrhea and fever.  Symptoms started a couple of days ago.  Vomiting has improved but she continues to have nausea.  Still continues with loose stools.  Vomitus contained only normal stomach contents.  No blood in her stool.  Fevers has resolved as of today.  Denies significant dyspnea or wheezing.  She is able to keep some fluids down.  Appetite is decreased.  Additionally she had labs completed at a functional medicine office.  She sent her labs attached to a MyChart message which I reviewed.  Her TSH was mildly elevated.  Additionally her TPO and thyroglobulin antibodies were abnormal.  Discussed possibly adding Armour Thyroid to replace levothyroxine.  She would like my opinion on this.  ROS:  A comprehensive ROS was completed and negative except as noted per HPI  Past Medical History:  Diagnosis Date   Allergic rhinitis due to other allergen    Allergy    Anemia    Anxiety    Asthma    B12 deficiency 04/27/2016   Bursitis, trochanteric    Cellulitis    Colon polyps    Depression    Gallstones    Headache    Heart palpitations    Hypercholesteremia    Hyperglycemia    Hypothyroidism due to Hashimoto's thyroiditis 07/04/2017   Insomnia    Iron deficiency anemia 04/27/2016   Morbid obesity ([place]) 07/04/2017   Obesity    Pneumonia    Right lumbar radiculopathy 05/02/2013   Sciatica    Sinusitis    Sleep apnea    CPAP   Vitamin B deficiency    Vitamin D deficiency     Past Surgical History:  Procedure Laterality Date   BIOPSY  01/12/2021   Procedure: BIOPSY;  Surgeon: Thornton Park, MD;  Location: [facility];  Service: Gastroenterology;;   CHOLECYSTECTOMY N/A 04/27/2012   Procedure:  LAPAROSCOPIC CHOLECYSTECTOMY;  Surgeon: Donato Heinz, MD;  Location: [facility];  Service: General;  Laterality: N/A;   COLONOSCOPY WITH PROPOFOL N/A 01/12/2021   Procedure: COLONOSCOPY WITH PROPOFOL;  Surgeon: Thornton Park, MD;  Location: [facility];  Service: Gastroenterology;  Laterality: N/A;   ESOPHAGOGASTRODUODENOSCOPY (EGD) WITH PROPOFOL N/A 01/12/2021   Procedure: ESOPHAGOGASTRODUODENOSCOPY (EGD) WITH PROPOFOL;  Surgeon: Thornton Park, MD;  Location: [facility];  Service: Gastroenterology;  Laterality: N/A;   EYE MUSCLE SURGERY Left    age 55   GASTRIC BYPASS     HEMOSTASIS CLIP PLACEMENT  01/12/2021   Procedure: HEMOSTASIS CLIP PLACEMENT;  Surgeon: Thornton Park, MD;  Location: [facility];  Service: Gastroenterology;;   POLYPECTOMY  01/12/2021   Procedure: POLYPECTOMY;  Surgeon: Thornton Park, MD;  Location: [facility];  Service: Gastroenterology;;   REFRACTIVE SURGERY Bilateral    WRIST SURGERY Left     Social History   Socioeconomic History   Marital status: Single    Spouse name: Not on file   Number of children: 0   Years of education: college   Highest education level: Not on file  Occupational History   Occupation: customer support agent    Comment: [employer]  Tobacco Use   Smoking status: Never    Passive exposure: Never   Smokeless tobacco: Never  Vaping Use   Vaping  Use: Never used  Substance and Sexual Activity   Alcohol use: Yes    Alcohol/week: 1.0 standard drink of alcohol    Types: 1 Cans of beer per week    Comment: occas.   Drug use: No   Sexual activity: Not Currently    Birth control/protection: Abstinence  Other Topics Concern   Not on file  Social History Narrative   Patient is single   Patient is right-handed.   Patient works full time at a call center    Education some college   Caffeine coffee two cups daily and some times one soda            Social Determinants of Health   Financial Resource Strain: Not on file   Food Insecurity: Not on file  Transportation Needs: Not on file  Physical Activity: Not on file  Stress: Not on file  Social Connections: Not on file    Family History  Problem Relation Age of Onset   Lupus Mother    Atrial fibrillation Mother    Gout Mother    Colon polyps Mother    Lung cancer Father    Cancer - Other Sister        appendix   Colon cancer Maternal Grandmother 32   Heart attack Maternal Grandmother    Stroke Maternal Grandfather    Hepatitis Paternal Grandfather        blood transfusion    Health Maintenance  Topic Date Due   HIV Screening  Never done   Hepatitis C Screening  Never done   PAP SMEAR-Modifier  Never done   MAMMOGRAM  03/02/2018   COVID-19 Vaccine (4 - Pfizer risk series) 04/01/2020   INFLUENZA VACCINE  09/21/2021   TETANUS/TDAP  12/03/2023   COLONOSCOPY (Pts 45-59yr Insurance coverage will need to be confirmed)  01/13/2031   Zoster Vaccines- Shingrix  Completed   HPV VACCINES  Aged Out     ----------------------------------------------------------------------------------------------------------------------------------------------------------------------------------------------------------------- Physical Exam BP 114/62 (BP Location: Left Wrist, Patient Position: Sitting, Cuff Size: Large)   Pulse 74   Temp 98.1 F (36.7 C) (Oral)   Ht '5\' 6"'$  (1.676 m)   Wt (!) 391 lb (177.4 kg)   SpO2 98%   BMI 63.11 kg/m   Physical Exam Constitutional:      Appearance: Normal appearance.  Eyes:     General: No scleral icterus. Cardiovascular:     Rate and Rhythm: Normal rate and regular rhythm.  Pulmonary:     Effort: Pulmonary effort is normal.     Breath sounds: Normal breath sounds.  Musculoskeletal:     Cervical back: Neck supple.  Neurological:     General: No focal deficit present.     Mental Status: She is alert.  Psychiatric:        Mood and Affect: Mood normal.        Behavior: Behavior normal.      ------------------------------------------------------------------------------------------------------------------------------------------------------------------------------------------------------------------- Assessment and Plan  Viral syndrome Symptoms consistent with viral etiology.  Tested for COVID and flu today which were negative.  Adding Zofran as needed for continued nausea.  Continue supportive care with increase fluids rest.  Contact clinic if symptoms are worsening or fail to improve as expected.  Hypothyroidism due to Hashimoto's thyroiditis TSH is mildly elevated.  Recommend that she increase her levothyroxine to 88 mcg.  Increased strength sent into the pharmacy.  She will discuss possible change to Armour Thyroid with her functional medicine provider however I recommended that she remain on levothyroxine.  Meds ordered this encounter  Medications   ondansetron (ZOFRAN-ODT) 4 MG disintegrating tablet    Sig: Take 1 tablet (4 mg total) by mouth every 8 (eight) hours as needed for nausea or vomiting.    Dispense:  20 tablet    Refill:  0   levothyroxine (SYNTHROID) 88 MCG tablet    Sig: Take 1 tablet (88 mcg total) by mouth daily before breakfast.    Dispense:  90 tablet    Refill:  0    Change in strength.    No follow-ups on file.    This visit occurred during the SARS-CoV-2 public health emergency.  Safety protocols were in place, including screening questions prior to the visit, additional usage of staff PPE, and extensive cleaning of exam room while observing appropriate contact time as indicated for disinfecting solutions.

## 2021-11-30 NOTE — Assessment & Plan Note (Signed)
Symptoms consistent with viral etiology.  Tested for COVID and flu today which were negative.  Adding Zofran as needed for continued nausea.  Continue supportive care with increase fluids rest.  Contact clinic if symptoms are worsening or fail to improve as expected.

## 2021-11-30 NOTE — Patient Instructions (Signed)

## 2021-12-01 ENCOUNTER — Emergency Department (EMERGENCY_DEPARTMENT_HOSPITAL): Payer: 59

## 2021-12-01 ENCOUNTER — Other Ambulatory Visit: Payer: Self-pay

## 2021-12-01 ENCOUNTER — Emergency Department (HOSPITAL_COMMUNITY)
Admission: EM | Admit: 2021-12-01 | Discharge: 2021-12-01 | Disposition: A | Discharge status: Home / Self Care | Payer: 59 | Attending: Emergency Medicine | Admitting: Emergency Medicine

## 2021-12-01 ENCOUNTER — Encounter (HOSPITAL_COMMUNITY): Payer: Self-pay

## 2021-12-01 DIAGNOSIS — L03116 Cellulitis of left lower limb: Secondary | ICD-10-CM | POA: Diagnosis not present

## 2021-12-01 DIAGNOSIS — M79605 Pain in left leg: Secondary | ICD-10-CM | POA: Diagnosis not present

## 2021-12-01 DIAGNOSIS — M7989 Other specified soft tissue disorders: Secondary | ICD-10-CM | POA: Diagnosis present

## 2021-12-01 LAB — BASIC METABOLIC PANEL
Anion gap: 10 (ref 5–15)
BUN: 16 mg/dL (ref 6–20)
CO2: 21 mmol/L — ABNORMAL LOW (ref 22–32)
Calcium: 8.8 mg/dL — ABNORMAL LOW (ref 8.9–10.3)
Chloride: 108 mmol/L (ref 98–111)
Creatinine, Ser: 0.74 mg/dL (ref 0.44–1.00)
GFR, Estimated: >60 mL/min (ref >60)
Glucose, Bld: 104 mg/dL — ABNORMAL HIGH (ref 70–99)
Potassium: 3.4 mmol/L — ABNORMAL LOW (ref 3.5–5.1)
Sodium: 139 mmol/L (ref 135–145)

## 2021-12-01 LAB — CBC
HCT: 43.4 % (ref 36.0–46.0)
Hemoglobin: 14 g/dL (ref 12.0–15.0)
MCH: 31.8 pg (ref 26.0–34.0)
MCHC: 32.3 g/dL (ref 30.0–36.0)
MCV: 98.6 fL (ref 80.0–100.0)
Platelets: 211 10*3/uL (ref 150–400)
RBC: 4.4 MIL/uL (ref 3.87–5.11)
RDW: 13.9 % (ref 11.5–15.5)
WBC: 5.1 10*3/uL (ref 4.0–10.5)
nRBC: 0 % (ref 0.0–0.2)

## 2021-12-01 MED ORDER — CEPHALEXIN 500 MG PO CAPS: 500.0000 mg | ORAL_CAPSULE | Freq: Four times a day (QID) | ORAL | 0 refills | Status: DC

## 2021-12-01 NOTE — Progress Notes (Signed)
LLE venous duplex has been completed.  Preliminary results given to Quentin Mulling, PA-C   Results can be found under chart review under CV PROC. 12/01/2021 4:00 PM Alisyn Lequire RVT, RDMS

## 2021-12-01 NOTE — Discharge Instructions (Addendum)
You were seen in the emergency department for redness and swelling to your left lower leg.  You had an ultrasound that did not show DVT.  Your blood work did not show any significant abnormalities.  Starting you on some antibiotics for possible skin infection.  Please follow-up with your primary care doctor and watch for extending redness or other signs of infection.  Return to the emergency department if any concerns.

## 2021-12-01 NOTE — ED Triage Notes (Signed)
Pt arrived via POV., c/o left leg swelling, and redness. Denies pain.

## 2021-12-01 NOTE — ED Provider Notes (Signed)
[facility] DEPT Provider Note   CSN: [id number] Arrival date & time: 12/01/21  1501     History  Chief Complaint  Patient presents with   Leg Swelling    Kristy Sharp is a 56 y.o. female.  He is here with a complaint of left lower leg swelling redness throbbing that started last evening.  She said she had flulike symptoms few days ago which were nausea vomiting diarrhea and a fever up to 101.5.  She saw her PCP who tested her for COVID and flu which were negative and is decided it was viral.  Her fevers and GI symptoms have improved.  She has some chronic edema of her lower extremities but she thought this was redder and hotter than normal.  No cough or shortness of breath.  The history is provided by the patient.  Leg Pain Location:  Leg Time since incident:  1 day Injury: no   Leg location:  L lower leg Pain details:    Quality:  Aching   Radiates to:  Does not radiate   Severity:  Moderate   Onset quality:  Gradual   Duration:  1 day   Timing:  Constant   Progression:  Unchanged Chronicity:  New Relieved by:  None tried Worsened by:  Nothing Ineffective treatments:  None tried Associated symptoms: fever and swelling        Home Medications Prior to Admission medications   Medication Sig Start Date End Date Taking? Authorizing Provider  albuterol (PROVENTIL HFA) 108 (90 Base) MCG/ACT inhaler Inhale 2 puffs into the lungs every 6 (six) hours as needed for wheezing or shortness of breath. 10/13/20   Luetta Nutting, DO  Cholecalciferol (VITAMIN D PO) Take 2 capsules by mouth at bedtime. 10,000 units    [provider]  citalopram (CELEXA) 40 MG tablet TAKE 1 TABLET BY MOUTH  DAILY 06/16/21   Luetta Nutting, DO  cyanocobalamin (,VITAMIN B-12,) 1000 MCG/ML injection Take 1 mg injection weekly x 2 months, and then monthly 12/25/20   Heath Lark, MD  folic acid (FOLVITE) 694 MCG tablet Take 400 mcg by mouth daily.    [provider]  furosemide (LASIX) 20 MG tablet Take 1 tablet (20 mg total) by mouth daily as needed. Patient taking differently: Take 20 mg by mouth daily as needed for edema. 03/12/18   Trixie Dredge, PA-C  levothyroxine (SYNTHROID) 88 MCG tablet Take 1 tablet (88 mcg total) by mouth daily before breakfast. 11/30/21   Luetta Nutting, DO  LORazepam (ATIVAN) 1 MG tablet TAKE 1 TABLET BY MOUTH  DAILY AS NEEDED FOR ANXIETY 04/12/21   Luetta Nutting, DO  meloxicam (MOBIC) 15 MG tablet TAKE 1 TABLET BY MOUTH  DAILY AS NEEDED FOR PAIN 04/12/21   Luetta Nutting, DO  metoprolol succinate (TOPROL-XL) 25 MG 24 hr tablet TAKE 1 TABLET (25 MG TOTAL) BY MOUTH DAILY. 10/18/21   Geralynn Rile, MD  montelukast (SINGULAIR) 10 MG tablet TAKE 1 TABLET BY MOUTH AT  BEDTIME 06/16/21   Luetta Nutting, DO  omeprazole (PRILOSEC) 20 MG capsule Take 2 capsules (40 mg total) by mouth 2 (two) times daily before a meal. 11/15/21   Thornton Park, MD  ondansetron (ZOFRAN-ODT) 4 MG disintegrating tablet Take 1 tablet (4 mg total) by mouth every 8 (eight) hours as needed for nausea or vomiting. 11/30/21   Luetta Nutting, DO  Propylene Glycol (SYSTANE BALANCE) 0.6 % SOLN Place 1 drop into both eyes daily as needed (  dry eyes).    [provider]  traZODone (DESYREL) 50 MG tablet TAKE 1 TABLET BY MOUTH AT  BEDTIME AS NEEDED FOR SLEEP 04/12/21   Luetta Nutting, DO  Vitamin A 10000 units TABS Take 0.5 tablets (5,000 Units total) by mouth daily. Patient taking differently: Take 10,000 Units by mouth daily. 03/20/18   Trixie Dredge, PA-C      Allergies    Patient has no known allergies.    Review of Systems   Review of Systems  Constitutional:  Positive for fever.  Respiratory:  Negative for shortness of breath.   Cardiovascular:  Negative for chest pain.  Gastrointestinal:  Positive for diarrhea, nausea and vomiting.  Musculoskeletal:  Negative for joint swelling.  Skin:  Positive for  rash.    Physical Exam Updated Vital Signs BP (!) 131/118 (BP Location: Left Arm)   Pulse 86   Temp 98 F (36.7 C) (Oral)   Resp 20   SpO2 99%  Physical Exam Vitals and nursing note reviewed.  Constitutional:      General: She is not in acute distress.    Appearance: Normal appearance. She is well-developed.  HENT:     Head: Normocephalic and atraumatic.  Eyes:     Conjunctiva/sclera: Conjunctivae normal.  Cardiovascular:     Rate and Rhythm: Normal rate and regular rhythm.     Heart sounds: No murmur heard. Pulmonary:     Effort: Pulmonary effort is normal. No respiratory distress.     Breath sounds: Normal breath sounds.  Abdominal:     Palpations: Abdomen is soft.     Tenderness: There is no abdominal tenderness.  Musculoskeletal:        General: Normal range of motion.     Cervical back: Neck supple.     Right lower leg: Edema present.     Left lower leg: Edema present.     Comments: She has some erythema of her left lower leg that starts at about her ankle and goes up to just below her knee.  It is mostly on the anterior part of her leg.  There is no cords or crepitus.  No obvious wounds.  No significant warmth.  Skin:    General: Skin is warm and dry.     Capillary Refill: Capillary refill takes less than 2 seconds.  Neurological:     General: No focal deficit present.     Mental Status: She is alert.     ED Results / Procedures / Treatments   Labs (all labs ordered are listed, but only abnormal results are displayed) Labs Reviewed  BASIC METABOLIC PANEL - Abnormal; Notable for the following components:      Result Value   Potassium 3.4 (*)    CO2 21 (*)    Glucose, Bld 104 (*)    Calcium 8.8 (*)    All other components within normal limits  CBC    EKG None  Radiology VAS [place] LOWER EXTREMITY VENOUS (DVT) (7a-7p)  Result Date: 12/01/2021  Lower Venous DVT Study Patient Name:  Kristy Sharp  Date of Exam:   12/01/2021 Medical Rec #: [id number]      Accession #:    [id number] Date of Birth: [birth year]    Patient Gender: F Patient Age:   71 years Exam Location:  [facility] Procedure:      VAS [place] LOWER EXTREMITY VENOUS (DVT) Referring Phys: CHRISTIAN PROSPERI --------------------------------------------------------------------------------  Indications: Pain.  Limitations: Body habitus and poor ultrasound/tissue interface.  Comparison Study: Previous exam (reflux) was negative for DVT on 01/23/20 Performing Technologist: Rogelia Rohrer RVT, RDMS  Examination Guidelines: A complete evaluation includes B-mode imaging, spectral Doppler, color Doppler, and power Doppler as needed of all accessible portions of each vessel. Bilateral testing is considered an integral part of a complete examination. Limited examinations for reoccurring indications may be performed as noted. The reflux portion of the exam is performed with the patient in reverse Trendelenburg.  +-----+---------------+---------+-----------+----------+--------------+ RIGHTCompressibilityPhasicitySpontaneityPropertiesThrombus Aging +-----+---------------+---------+-----------+----------+--------------+ CFV  Full           Yes      Yes                                 +-----+---------------+---------+-----------+----------+--------------+   +---------+---------------+---------+-----------+----------+--------------+ LEFT     CompressibilityPhasicitySpontaneityPropertiesThrombus Aging +---------+---------------+---------+-----------+----------+--------------+ CFV      Full           Yes      Yes                                 +---------+---------------+---------+-----------+----------+--------------+ SFJ      Full                                                        +---------+---------------+---------+-----------+----------+--------------+ FV Prox  Full           Yes      Yes                                  +---------+---------------+---------+-----------+----------+--------------+ FV Mid   Full           Yes      Yes                                 +---------+---------------+---------+-----------+----------+--------------+ FV DistalFull           Yes      Yes                                 +---------+---------------+---------+-----------+----------+--------------+ PFV      Full                                                        +---------+---------------+---------+-----------+----------+--------------+ POP      Full           Yes      Yes                                 +---------+---------------+---------+-----------+----------+--------------+ PTV      Full                                                        +---------+---------------+---------+-----------+----------+--------------+  PERO     Full                                                        +---------+---------------+---------+-----------+----------+--------------+     Summary: RIGHT: - No evidence of common femoral vein obstruction.  LEFT: - There is no evidence of deep vein thrombosis in the lower extremity.  - No cystic structure found in the popliteal fossa. - Ultrasound characteristics of enlarged lymph nodes noted in the groin.  *See table(s) above for measurements and observations.    Preliminary     Procedures Procedures    Medications Ordered in ED Medications - No data to display  ED Course/ Medical Decision Making/ A&P Clinical Course as of 12/02/21 1049  Wed Dec 01, 2021  1753 Labs unremarkable and vascular imaging did not show any obvious DVT.  We will treat for possible cellulitis.  Return instructions discussed [MB]    Clinical Course User Index [MB] Kristy Rasmussen, MD                           Medical Decision Making Risk Prescription drug management.   This patient complains of left leg swelling and redness and pain; this involves an extensive number of  treatment Options and is a complaint that carries with it a high risk of complications and morbidity. The differential includes DVT, cellulitis, peripheral vascular disease, lymphedema  I ordered, reviewed and interpreted labs, which included CBC with normal white count, chemistries with mildly low potassium low bicarb I ordered imaging studies which included duplex left lower extremity and I independently    visualized and interpreted imaging which showed no acute DVT Previous records obtained and reviewed in epic no recent admissions Cardiac monitoring reviewed, normal sinus rhythm Social determinants considered, no significant barriers Critical Interventions: None  After the interventions stated above, I reevaluated the patient and found patient to be nontoxic-appearing Admission and further testing considered, no indications for admission or further testing at this time.  Will cover with antibiotics for possible cellulitis.  Recommended close follow-up with PCP.  Return instructions discussed.         Final Clinical Impression(s) / ED Diagnoses Final diagnoses:  Cellulitis of left lower extremity    Rx / DC Orders ED Discharge Orders          Ordered    cephALEXin (KEFLEX) 500 MG capsule  4 times daily        12/01/21 1759              Kristy Rasmussen, MD 12/02/21 1051

## 2021-12-01 NOTE — ED Provider Triage Note (Signed)
Emergency Medicine Provider Triage Evaluation Note  Simra Fiebig , a 56 y.o. female  was evaluated in triage.  Pt complains of left leg swelling, pain, redness since last night. Hx of lymphedema, but patient reports much worse on right leg usually. Reports some intermittent fever, cough yesterday, was negative for covid, flu at PCP.  Review of Systems  Positive: Leg swelling, pain Negative: shob  Physical Exam  BP (!) 131/118 (BP Location: Left Arm)   Pulse 86   Temp 98 F (36.7 C) (Oral)   Resp 20   SpO2 99%  Gen:   Awake, no distress   Resp:  Normal effort  MSK:   Moves extremities without difficulty  Other:  Patient with large nonpitting edema bilateral lower extremities, there is some localized redness of the left leg but no significant fluctuance, or heat.  No evidence of focal fluid collection, or abscess.  Medical Decision Making  Medically screening exam initiated at 3:31 PM.  Appropriate orders placed.  Manpreet Lundy was informed that the remainder of the evaluation will be completed by another provider, this initial triage assessment does not replace that evaluation, and the importance of remaining in the ED until their evaluation is complete.  Workup initiated   Anselmo Pickler, [place] 12/01/21 1533

## 2021-12-03 ENCOUNTER — Telehealth: Payer: Self-pay | Admitting: General Practice

## 2021-12-03 NOTE — Telephone Encounter (Signed)
Transition Care Management Unsuccessful Follow-up Telephone Call  Date of discharge and from where:  12/01/21 from [facility]  Attempts:  1st Attempt  Reason for unsuccessful TCM follow-up call:  Left voice message

## 2021-12-13 NOTE — Telephone Encounter (Signed)
Transition Care Management Unsuccessful Follow-up Telephone Call  Date of discharge and from where:  12/01/21 from [facility]  Attempts:  2nd Attempt  Reason for unsuccessful TCM follow-up call:  Left voice message

## 2021-12-15 NOTE — Telephone Encounter (Signed)
Transition Care Management Unsuccessful Follow-up Telephone Call  Date of discharge and from where:  12/01/21 from [facility]  Attempts:  3rd Attempt  Reason for unsuccessful TCM follow-up call:  Left voice message

## 2022-02-26 ENCOUNTER — Other Ambulatory Visit: Payer: Self-pay | Admitting: Family Medicine

## 2022-03-01 NOTE — Telephone Encounter (Signed)
Ok to refill x30 days but needs appt for f/u.   Thanks!  CM

## 2022-04-03 ENCOUNTER — Other Ambulatory Visit: Payer: Self-pay | Admitting: Family Medicine

## 2022-07-25 ENCOUNTER — Telehealth: Payer: Self-pay

## 2022-07-25 NOTE — Telephone Encounter (Signed)
Called and left a message asking her to call the office back. Asking if she wants a follow up with Dr. Bertis Ruddy, no appt is scheduled.

## 2022-07-31 NOTE — Progress Notes (Unsigned)
Cardiology Office Note:   Date:  07/31/2022  NAME:  Kristy Sharp    MRN: [id number] DOB:  [birth year]   PCP:  Everrett Coombe, DO  Cardiologist:  None  Electrophysiologist:  None   Referring MD: Everrett Coombe, DO   No chief complaint on file. ***  History of Present Illness:   Kristy Sharp is a 57 y.o. female with a hx of obesity, A tach, venous insufficiency who presents for follow-up.   Problem List 1. Morbid obesity  -BMI 65 -s/p bariatric surgery 2. B12 deficiency 3. Venous insufficiency  -bilateral great saphenous veins 4. Atrial tachycardia -Brief episodes ~8 seconds on zio  5. OSA -moderate 02/25/2020 6. Iron deficiency anemia   Past Medical History: Past Medical History:  Diagnosis Date   Allergic rhinitis due to other allergen    Allergy    Anemia    Anxiety    Asthma    B12 deficiency 04/27/2016   Bursitis, trochanteric    Cellulitis    Colon polyps    Depression    Gallstones    Headache    Heart palpitations    Hypercholesteremia    Hyperglycemia    Hypothyroidism due to Hashimoto's thyroiditis 07/04/2017   Insomnia    Iron deficiency anemia 04/27/2016   Morbid obesity (HCC) 07/04/2017   Obesity    Pneumonia    Right lumbar radiculopathy 05/02/2013   Sciatica    Sinusitis    Sleep apnea    CPAP   Vitamin B deficiency    Vitamin D deficiency     Past Surgical History: Past Surgical History:  Procedure Laterality Date   BIOPSY  01/12/2021   Procedure: BIOPSY;  Surgeon: Tressia Danas, MD;  Location: [facility];  Service: Gastroenterology;;   CHOLECYSTECTOMY N/A 04/27/2012   Procedure: LAPAROSCOPIC CHOLECYSTECTOMY;  Surgeon: Fabio Bering, MD;  Location: [facility];  Service: General;  Laterality: N/A;   COLONOSCOPY WITH PROPOFOL N/A 01/12/2021   Procedure: COLONOSCOPY WITH PROPOFOL;  Surgeon: Tressia Danas, MD;  Location: [facility];  Service: Gastroenterology;  Laterality: N/A;   ESOPHAGOGASTRODUODENOSCOPY (EGD) WITH PROPOFOL  N/A 01/12/2021   Procedure: ESOPHAGOGASTRODUODENOSCOPY (EGD) WITH PROPOFOL;  Surgeon: Tressia Danas, MD;  Location: [facility];  Service: Gastroenterology;  Laterality: N/A;   EYE MUSCLE SURGERY Left    age 44   GASTRIC BYPASS     HEMOSTASIS CLIP PLACEMENT  01/12/2021   Procedure: HEMOSTASIS CLIP PLACEMENT;  Surgeon: Tressia Danas, MD;  Location: [facility];  Service: Gastroenterology;;   POLYPECTOMY  01/12/2021   Procedure: POLYPECTOMY;  Surgeon: Tressia Danas, MD;  Location: [facility];  Service: Gastroenterology;;   REFRACTIVE SURGERY Bilateral    WRIST SURGERY Left     Current Medications: No outpatient medications have been marked as taking for the 08/01/22 encounter (Appointment) with Sande Rives, MD.     Allergies:    Patient has no known allergies.   Social History: Social History   Socioeconomic History   Marital status: Single    Spouse name: Not on file   Number of children: 0   Years of education: college   Highest education level: Not on file  Occupational History   Occupation: customer support agent    Comment: [employer]  Tobacco Use   Smoking status: Never    Passive exposure: Never   Smokeless tobacco: Never  Vaping Use   Vaping Use: Never used  Substance and Sexual Activity   Alcohol use: Yes    Alcohol/week: 1.0 standard drink of alcohol  Types: 1 Cans of beer per week    Comment: occas.   Drug use: No   Sexual activity: Not Currently    Birth control/protection: Abstinence  Other Topics Concern   Not on file  Social History Narrative   Patient is single   Patient is right-handed.   Patient works full time at a call center    Education some college   Caffeine coffee two cups daily and some times one soda            Social Determinants of Corporate investment banker Strain: Not on file  Food Insecurity: Not on file  Transportation Needs: Not on file  Physical Activity: Not on file  Stress: Not on file  Social  Connections: Not on file     Family History: The patient's ***family history includes Atrial fibrillation in her mother; Cancer - Other in her sister; Colon cancer (age of onset: 64) in her maternal grandmother; Colon polyps in her mother; Gout in her mother; Heart attack in her maternal grandmother; Hepatitis in her paternal grandfather; Lung cancer in her father; Lupus in her mother; Stroke in her maternal grandfather.  ROS:   All other ROS reviewed and negative. Pertinent positives noted in the HPI.     EKGs/Labs/Other Studies Reviewed:   The following studies were personally reviewed by me today:  EKG:  EKG is *** ordered today.  The ekg ordered today demonstrates ***, and was personally reviewed by me.   TTE 01/30/2020  1. Left ventricular ejection fraction, by estimation, is 60 to 65%. The  left ventricle has normal function. The left ventricle has no regional  wall motion abnormalities. Left ventricular diastolic parameters were  normal.   2. Right ventricular systolic function is normal. The right ventricular  size is normal.   3. Left atrial size was mildly dilated.   4. The mitral valve is normal in structure. Trivial mitral valve  regurgitation. No evidence of mitral stenosis.   5. The aortic valve is tricuspid. Aortic valve regurgitation is not  visualized. Mild aortic valve sclerosis is present, with no evidence of  aortic valve stenosis.   6. The inferior vena cava is normal in size with greater than 50%  respiratory variability, suggesting right atrial pressure of 3 mmHg.   Recent Labs: 12/01/2021: BUN 16; Creatinine, Ser 0.74; Hemoglobin 14.0; Platelets 211; Potassium 3.4; Sodium 139   Recent Lipid Panel No results found for: "CHOL", "TRIG", "HDL", "CHOLHDL", "VLDL", "LDLCALC", "LDLDIRECT"  Physical Exam:   VS:  There were no vitals taken for this visit.   Wt Readings from Last 3 Encounters:  11/30/21 (!) 391 lb (177.4 kg)  04/15/21 (!) 406 lb (184.2 kg)   03/23/21 (!) 409 lb (185.5 kg)    General: Well nourished, well developed, in no acute distress Head: Atraumatic, normal size  Eyes: PEERLA, EOMI  Neck: Supple, no JVD Endocrine: No thryomegaly Cardiac: Normal S1, S2; RRR; no murmurs, rubs, or gallops Lungs: Clear to auscultation bilaterally, no wheezing, rhonchi or rales  Abd: Soft, nontender, no hepatomegaly  Ext: No edema, pulses 2+ Musculoskeletal: No deformities, BUE and BLE strength normal and equal Skin: Warm and dry, no rashes   Neuro: Alert and oriented to person, place, time, and situation, CNII-XII grossly intact, no focal deficits  Psych: Normal mood and affect   ASSESSMENT:   Kristy Sharp is a 57 y.o. female who presents for the following: No diagnosis found.  PLAN:   There are no diagnoses  linked to this encounter.  {Are you ordering a CV Procedure (e.g. stress test, cath, DCCV, TEE, etc)?   Press F2        :[id number]}  Disposition: No follow-ups on file.  Medication Adjustments/Labs and Tests Ordered: Current medicines are reviewed at length with the patient today.  Concerns regarding medicines are outlined above.  No orders of the defined types were placed in this encounter.  No orders of the defined types were placed in this encounter.   There are no Patient Instructions on file for this visit.   Time Spent with Patient: I have spent a total of *** minutes with patient reviewing hospital notes, telemetry, EKGs, labs and examining the patient as well as establishing an assessment and plan that was discussed with the patient.  > 50% of time was spent in direct patient care.  Signed, Lenna Gilford. Flora Lipps, MD, [facility]  [facility]  [address], [address] [place], [place] [address] [phone]  07/31/2022 7:23 PM

## 2022-08-01 ENCOUNTER — Ambulatory Visit: Payer: Managed Care, Other (non HMO) | Attending: Cardiovascular Disease | Admitting: Cardiovascular Disease

## 2022-08-01 ENCOUNTER — Encounter: Payer: Self-pay | Admitting: Cardiovascular Disease

## 2022-08-01 ENCOUNTER — Ambulatory Visit: Payer: 59 | Attending: Cardiovascular Disease

## 2022-08-01 VITALS — BP 104/72 | HR 93 | Ht 66.0 in | Wt 375.6 lb

## 2022-08-01 DIAGNOSIS — R002 Palpitations: Secondary | ICD-10-CM | POA: Diagnosis not present

## 2022-08-01 DIAGNOSIS — I4719 Other supraventricular tachycardia: Secondary | ICD-10-CM | POA: Diagnosis not present

## 2022-08-01 DIAGNOSIS — I4819 Other persistent atrial fibrillation: Secondary | ICD-10-CM | POA: Diagnosis not present

## 2022-08-01 DIAGNOSIS — I872 Venous insufficiency (chronic) (peripheral): Secondary | ICD-10-CM

## 2022-08-01 DIAGNOSIS — G4733 Obstructive sleep apnea (adult) (pediatric): Secondary | ICD-10-CM

## 2022-08-01 NOTE — Patient Instructions (Addendum)
Medication Instructions:  The current medical regimen is effective;  continue present plan and medications.  *If you need a refill on your cardiac medications before your next appointment, please call your pharmacy*   Lab Work: TSH today   If you have labs (blood work) drawn today and your tests are completely normal, you will receive your results only by: MyChart Message (if you have MyChart) OR A paper copy in the mail If you have any lab test that is abnormal or we need to change your treatment, we will call you to review the results.   Testing/Procedures:  Echocardiogram - Your physician has requested that you have an echocardiogram. Echocardiography is a painless test that uses sound waves to create images of your heart. It provides your doctor with information about the size and shape of your heart and how well your heart's chambers and valves are working. This procedure takes approximately one hour. There are no restrictions for this procedure.   ZIO XT- Long Term Monitor Instructions  Your physician has requested you wear a ZIO patch monitor for 7 days.  This is a single patch monitor. [employer] supplies one patch monitor per enrollment. Additional stickers are not available. Please do not apply patch if you will be having a Nuclear Stress Test,  Echocardiogram, Cardiac CT, MRI, or Chest Xray during the period you would be wearing the  monitor. The patch cannot be worn during these tests. You cannot remove and re-apply the  ZIO XT patch monitor.  Your ZIO patch monitor will be mailed 3 day USPS to your address on file. It may take 3-5 days  to receive your monitor after you have been enrolled.  Once you have received your monitor, please review the enclosed instructions. Your monitor  has already been registered assigning a specific monitor serial # to you.  Billing and Patient Assistance Program Information  We have supplied [employer] with any of your insurance information on  file for billing purposes. [employer] offers a sliding scale Patient Assistance Program for patients that do not have  insurance, or whose insurance does not completely cover the cost of the ZIO monitor.  You must apply for the Patient Assistance Program to qualify for this discounted rate.  To apply, please call [employer] at [phone], select option 4, select option 2, ask to apply for  Patient Assistance Program. Meredeth Ide will ask your household income, and how many people  are in your household. They will quote your out-of-pocket cost based on that information.  [employer] will also be able to set up a 57-month, interest-free payment plan if needed.  Applying the monitor   Shave hair from upper left chest.  Hold abrader disc by orange tab. Rub abrader in 40 strokes over the upper left chest as  indicated in your monitor instructions.  Clean area with 4 enclosed alcohol pads. Let dry.  Apply patch as indicated in monitor instructions. Patch will be placed under collarbone on left  side of chest with arrow pointing upward.  Rub patch adhesive wings for 2 minutes. Remove white label marked "1". Remove the white  label marked "2". Rub patch adhesive wings for 2 additional minutes.  While looking in a mirror, press and release button in center of patch. A small green light will  flash 3-4 times. This will be your only indicator that the monitor has been turned on.  Do not shower for the first 24 hours. You may shower after the first 24 hours.  Press  the button if you feel a symptom. You will hear a small click. Record Date, Time and  Symptom in the Patient Logbook.  When you are ready to remove the patch, follow instructions on the last 2 pages of Patient  Logbook. Stick patch monitor onto the last page of Patient Logbook.  Place Patient Logbook in the blue and white box. Use locking tab on box and tape box closed  securely. The blue and white box has prepaid postage on it. Please place it in the  mailbox as  soon as possible. Your physician should have your test results approximately 7 days after the  monitor has been mailed back to [facility].  Call [facility] Customer Care at [phone] if you have questions regarding  your ZIO XT patch monitor. Call them immediately if you see an orange light blinking on your  monitor.  If your monitor falls off in less than 4 days, contact our Monitor department at [phone].  If your monitor becomes loose or falls off after 4 days call [employer] at [phone] for  suggestions on securing your monitor    Follow-Up: At [facility] you and your health needs are our priority.  As part of our continuing mission to provide you with exceptional heart care, we have created designated Provider Care Teams.  These Care Teams include your primary Cardiologist (physician) and Advanced Practice Providers (APPs -  Physician Assistants and Nurse Practitioners) who all work together to provide you with the care you need, when you need it.  We recommend signing up for the patient portal called "MyChart".  Sign up information is provided on this After Visit Summary.  MyChart is used to connect with patients for Virtual Visits (Telemedicine).  Patients are able to view lab/test results, encounter notes, upcoming appointments, etc.  Non-urgent messages can be sent to your provider as well.   To learn more about what you can do with MyChart, go to [email or web address].    Your next appointment:   3 month(s)  Provider:   Lennie Odor, MD  Other Instructions Referral to see Dr.Turner for sleep.

## 2022-08-01 NOTE — Progress Notes (Unsigned)
Enrolled for [employer] to mail a ZIO XT long term holter monitor to the patients address on file.  

## 2022-08-02 LAB — TSH: TSH: 3.58 u[IU]/mL (ref 0.450–4.500)

## 2022-08-04 DIAGNOSIS — I4819 Other persistent atrial fibrillation: Secondary | ICD-10-CM | POA: Diagnosis not present

## 2022-08-19 ENCOUNTER — Encounter: Payer: Self-pay | Admitting: Cardiovascular Disease

## 2022-09-01 ENCOUNTER — Ambulatory Visit (HOSPITAL_COMMUNITY): Payer: Managed Care, Other (non HMO) | Attending: Cardiology

## 2022-09-01 DIAGNOSIS — I4819 Other persistent atrial fibrillation: Secondary | ICD-10-CM | POA: Diagnosis not present

## 2022-09-01 LAB — ECHOCARDIOGRAM COMPLETE
Area-P 1/2: 4.15 cm2
S' Lateral: 4.1 cm

## 2022-10-13 ENCOUNTER — Encounter: Payer: Self-pay | Admitting: Cardiology

## 2022-10-13 ENCOUNTER — Ambulatory Visit: Payer: Managed Care, Other (non HMO) | Attending: Cardiology | Admitting: Cardiology

## 2022-10-13 VITALS — BP 121/71 | HR 64 | Ht 66.0 in | Wt 381.4 lb

## 2022-10-13 DIAGNOSIS — G4733 Obstructive sleep apnea (adult) (pediatric): Secondary | ICD-10-CM | POA: Diagnosis not present

## 2022-10-13 NOTE — Patient Instructions (Signed)
Medication Instructions:  Your physician recommends that you continue on your current medications as directed. Please refer to the Current Medication list given to you today.  *If you need a refill on your cardiac medications before your next appointment, please call your pharmacy*   Lab Work: None.  If you have labs (blood work) drawn today and your tests are completely normal, you will receive your results only by: MyChart Message (if you have MyChart) OR A paper copy in the mail If you have any lab test that is abnormal or we need to change your treatment, we will call you to review the results.   Testing/Procedures: None.   Follow-Up:    Your next appointment:   3 month(s)  Provider:   Dr. Armanda Magic, MD   Other Instructions Our sleep studies staff will call you regarding getting you set up with [facility] for your cpap DME. We will also set you up in [facility] so that Dr. Mayford Knife can get downloaded information from your cpap machine. Dr. Mayford Knife is ordered a new RESMED under the nose full face mask for you. She is also ordered an in-person mask fitting. Someone from either our sleep studies team or from [facility] will call your regarding these tasks/equipment.

## 2022-10-13 NOTE — Progress Notes (Signed)
Sleep Medicine CONSULT Note    Date:  10/13/2022   ID:  Kristy Sharp, DOB 01/31/1966, MRN [id number]  PCP:  Lawerance Sabal, PA  Cardiologist: Guinevere Scarlet, MD  Chief Complaint  Patient presents with   New Patient (Initial Visit)    Obstructive sleep apnea    History of Present Illness:  Kristy Sharp is a 57 y.o. female who is being seen today for the evaluation of obstructive sleep apnea at the request of Lennie Odor, MD.  Is a 57 year old morbidly obese female with a history of anxiety and depression, hyperlipidemia, hypothyroidism related to Hashimoto's thyroiditis and obstructive sleep apnea.  She has been on CPAP in the past.  When she was last seen by Dr. Bufford Buttner she was having a problem with her mask and was not followed by sleep physician.  She is now referred for sleep medicine consultation to establish sleep care and ongoing treatment of her obstructive sleep apnea.  She initially had a sleep study done in January 2022 which demonstrated moderate obstructive sleep apnea with an AHI of 18.5/h.  She was then placed on CPAP.   She has struggled with the PAP masks.  She tried multiple FFM but they irritated the bridge of her nose.  She tried the nasal pillow masks but she is a mouth breather and even with the chin strap she did not tolerate it. She tried an F&P under the nose FFM but it leaked some.  She used to follow with [facility] as her DME but they stopped supplying PAP supplies so she has not been using her device.  She feels tired when she wakes up and gets very sleepy all day and has had problems staying asleep when driving.   Past Medical History:  Diagnosis Date   Allergic rhinitis due to other allergen    Allergy    Anemia    Anxiety    Asthma    B12 deficiency 04/27/2016   Bursitis, trochanteric    Cellulitis    Colon polyps    Depression    Gallstones    Headache    Heart palpitations    Hypercholesteremia    Hyperglycemia    Hypothyroidism due to  Hashimoto's thyroiditis 07/04/2017   Insomnia    Iron deficiency anemia 04/27/2016   Morbid obesity (HCC) 07/04/2017   Obesity    Pneumonia    Right lumbar radiculopathy 05/02/2013   Sciatica    Sinusitis    Sleep apnea    CPAP   Vitamin B deficiency    Vitamin D deficiency     Past Surgical History:  Procedure Laterality Date   BIOPSY  01/12/2021   Procedure: BIOPSY;  Surgeon: Tressia Danas, MD;  Location: [facility];  Service: Gastroenterology;;   CHOLECYSTECTOMY N/A 04/27/2012   Procedure: LAPAROSCOPIC CHOLECYSTECTOMY;  Surgeon: Fabio Bering, MD;  Location: [facility];  Service: General;  Laterality: N/A;   COLONOSCOPY WITH PROPOFOL N/A 01/12/2021   Procedure: COLONOSCOPY WITH PROPOFOL;  Surgeon: Tressia Danas, MD;  Location: [facility];  Service: Gastroenterology;  Laterality: N/A;   ESOPHAGOGASTRODUODENOSCOPY (EGD) WITH PROPOFOL N/A 01/12/2021   Procedure: ESOPHAGOGASTRODUODENOSCOPY (EGD) WITH PROPOFOL;  Surgeon: Tressia Danas, MD;  [facility];  Service: Gastroenterology;  Laterality: N/A;   EYE MUSCLE SURGERY Left    age 35   GASTRIC BYPASS     HEMOSTASIS CLIP PLACEMENT  01/12/2021   Procedure: HEMOSTASIS CLIP PLACEMENT;  Surgeon: Tressia Danas, MD;  Location: [facility];  Service: Gastroenterology;;  POLYPECTOMY  01/12/2021   Procedure: POLYPECTOMY;  Surgeon: Tressia Danas, MD;  [facility];  Service: Gastroenterology;;   REFRACTIVE SURGERY Bilateral    WRIST SURGERY Left     Current Medications: Current Meds  Medication Sig   albuterol (PROVENTIL HFA) 108 (90 Base) MCG/ACT inhaler Inhale 2 puffs into the lungs every 6 (six) hours as needed for wheezing or shortness of breath.   Cholecalciferol (VITAMIN D PO) Take 2 capsules by mouth at bedtime. 10,000 units   citalopram (CELEXA) 40 MG tablet TAKE 1 TABLET BY MOUTH  DAILY   cyanocobalamin (,VITAMIN B-12,) 1000 MCG/ML injection Take 1 mg injection weekly x 2 months, and  then monthly   folic acid (FOLVITE) 400 MCG tablet Take 400 mcg by mouth daily.   levothyroxine (SYNTHROID) 88 MCG tablet Take 1 tablet (88 mcg total) by mouth daily before breakfast. NO REFILLS. NEEDS LABS & F/U W/PCP.   LORazepam (ATIVAN) 1 MG tablet TAKE 1 TABLET BY MOUTH  DAILY AS NEEDED FOR ANXIETY   meloxicam (MOBIC) 15 MG tablet TAKE 1 TABLET BY MOUTH  DAILY AS NEEDED FOR PAIN   metoprolol succinate (TOPROL-XL) 25 MG 24 hr tablet TAKE 1 TABLET (25 MG TOTAL) BY MOUTH DAILY.   montelukast (SINGULAIR) 10 MG tablet TAKE 1 TABLET BY MOUTH AT  BEDTIME   omeprazole (PRILOSEC) 20 MG capsule Take 2 capsules (40 mg total) by mouth 2 (two) times daily before a meal.   Propylene Glycol (SYSTANE BALANCE) 0.6 % SOLN Place 1 drop into both eyes daily as needed (dry eyes).   traZODone (DESYREL) 50 MG tablet TAKE 1 TABLET BY MOUTH AT  BEDTIME AS NEEDED FOR SLEEP   Vitamin A 29528 units TABS Take 0.5 tablets (5,000 Units total) by mouth daily. (Patient taking differently: Take 10,000 Units by mouth daily.)    Allergies:   Patient has no known allergies.   Social History   Socioeconomic History   Marital status: Single    Spouse name: Not on file   Number of children: 0   Years of education: college   Highest education level: Not on file  Occupational History   Occupation: customer support agent    Comment: [employer]  Tobacco Use   Smoking status: Never    Passive exposure: Never   Smokeless tobacco: Never  Vaping Use   Vaping status: Never Used  Substance and Sexual Activity   Alcohol use: Yes    Alcohol/week: 1.0 standard drink of alcohol    Types: 1 Cans of beer per week    Comment: occas.   Drug use: No   Sexual activity: Not Currently    Birth control/protection: Abstinence  Other Topics Concern   Not on file  Social History Narrative   Patient is single   Patient is right-handed.   Patient works full time at a call center    Education some college   Caffeine coffee two cups daily  and some times one soda            Social Determinants of Health   Financial Resource Strain: Not on file  Food Insecurity: Not on file  Transportation Needs: Not on file  Physical Activity: Not on file  Stress: Not on file  Social Connections: Unknown (07/05/2021)   [facility]   Social Network    Social Network: Not on file     Family History:  The patient's family history includes Atrial fibrillation in her mother; Cancer -  Other in her sister; Colon cancer (age of onset: 45) in her maternal grandmother; Colon polyps in her mother; Gout in her mother; Heart attack in her maternal grandmother; Hepatitis in her paternal grandfather; Lung cancer in her father; Lupus in her mother; Stroke in her maternal grandfather.   ROS:   Please see the history of present illness.    ROS All other systems reviewed and are negative.      No data to display             PHYSICAL EXAM:   VS:  BP 121/71   Pulse 64   Ht 5\' 6"  (1.676 m)   Wt (!) 381 lb 6.4 oz (173 kg)   SpO2 98%   BMI 61.56 kg/m    GEN: Well nourished, well developed, in no acute distress Morbidly obese HEENT: small ooropharynx and elongated uvula Neck: no JVD, carotid bruits, or masses Cardiac: RRR; no murmurs, rubs, or gallops,no edema.  Intact distal pulses bilaterally.  Respiratory:  clear to auscultation bilaterally, normal work of breathing GI: soft, nontender, nondistended, + BS MS: no deformity or atrophy  Skin: warm and dry, no rash Neuro:  Alert and Oriented x 3, Strength and sensation are intact Psych: euthymic mood, full affect  Wt Readings from Last 3 Encounters:  10/13/22 (!) 381 lb 6.4 oz (173 kg)  08/01/22 (!) 375 lb 9.6 oz (170.4 kg)  11/30/21 (!) 391 lb (177.4 kg)      Studies/Labs Reviewed:   Home sleep study 2022  Recent Labs: 12/01/2021: BUN 16; Creatinine, Ser 0.74; Hemoglobin 14.0; Platelets 211; Potassium 3.4; Sodium 139 08/01/2022: TSH 3.580      Additional studies/ records that were reviewed today include:  Office visit notes from Dr. Flora Lipps    ASSESSMENT:    1. Obstructive sleep apnea (adult) (pediatric)      PLAN:  In order of problems listed above:  OSA  -Patient has a history of moderate obstructive sleep apnea with an AHI of 18.5/h on the sleep study in 2022 and has been on CPAP since then -She recently has not been compliant with her CPAP because she cannot find a mask that works well for her. -I will order a new mask and CPAP supplies and get her set up in person with her DME to get a mask fitting>>I think she would benefit from trying another under the nose FFM so I will order a ResMed mask instead of the F&P she was using.   -I will get her set up on Airview so that we can follow her AHI -she has an elongated uvula but I do not think she would be a candidate, nor do I think it would help her OSA to undergo uvuloplasty -Follow-up with me in 3 months.  -she is currently not a candidate for the Inpsire device due to her morbid obesity but if she can get to a BMI of 40 she would be a candidate  2.  Morbid Obesity -she has had a Roux-en-Y bypass in 2004 but gained the weight back -she is trying to lose weight through a homeopathic MD -I have encouraged her to try to get down to a BMI of 40   Time Spent: 20 minutes total time of encounter, including 15 minutes spent in face-to-face patient care on the date of this encounter. This time includes coordination of care and counseling regarding above mentioned problem list. Remainder of non-face-to-face time involved reviewing chart documents/testing relevant to the patient encounter and  documentation in the medical record. I have independently reviewed documentation from referring provider  Medication Adjustments/Labs and Tests Ordered: Current medicines are reviewed at length with the patient today.  Concerns regarding medicines are outlined above.  Medication  changes, Labs and Tests ordered today are listed in the Patient Instructions below.  There are no Patient Instructions on file for this visit.   Signed, Armanda Magic, MD  10/13/2022 8:58 AM    [facility] [address] [place], [place], [place]  [address] Phone: [phone]; Fax: [phone]

## 2022-10-18 ENCOUNTER — Telehealth: Payer: Self-pay

## 2022-10-18 DIAGNOSIS — G4733 Obstructive sleep apnea (adult) (pediatric): Secondary | ICD-10-CM

## 2022-10-18 NOTE — Telephone Encounter (Signed)
Order sent to [facility] for new under the nose full face mask, and supplies. [facility] was previous DME, no longer CPAP supplier.

## 2022-11-01 NOTE — Progress Notes (Unsigned)
Cardiology Office Note:   Date:  11/03/2022  NAME:  Kristy Sharp    MRN: [id number] DOB:  Oct 31, 1965   PCP:  Lawerance Sabal, PA  Cardiologist:  Reatha Harps, MD  Electrophysiologist:  None   Referring MD: Lawerance Sabal, [place]   Chief Complaint  Patient presents with   Follow-up        History of Present Illness:   Kristy Sharp is a 57 y.o. female with a hx of pAF, obesity who presents for follow-up.   Discussed the use of AI scribe software for clinical note transcription with the patient, who gave verbal consent to proceed.  History of Present Illness   Kristy Sharp, a patient with a history of atrial fibrillation (AFib) and sleep apnea, presents for a follow-up visit. She denies experiencing any rapid heartbeat or AFib symptoms. She reports that she only notices a rapid heartbeat at night when lying on her left side.  Regarding her sleep apnea, she recently consulted with Dr. Mayford Knife. She was deemed ineligible for Inspire therapy due to her weight. However, she is now in the process of being set up with a new company, [employer], to get fitted for a better mask. She admits to a delay in this process and plans to follow up with the company.  She is also working on weight loss, which she understands will help with her overall health and specifically her sleep apnea. She reports some swelling in her legs, but it is not as severe as it has been in the past. She tries to elevate her legs when possible, but her job requires her to sit at a computer for most of the day.       Problem List 1. Morbid obesity  -BMI 60 -s/p bariatric surgery 2. B12 deficiency 3. Venous insufficiency  -bilateral great saphenous veins 4. Atrial tachycardia -Brief episodes ~8 seconds on zio  5. Persistent Afib -CHADSVASC=1 (female) -Dx 08/01/2022 -no recurrence on monitor 08/01/2022 6. OSA -moderate 02/25/2020 7. Iron deficiency anemia   Past Medical History: Past Medical History:  Diagnosis Date    Allergic rhinitis due to other allergen    Allergy    Anemia    Anxiety    Asthma    B12 deficiency 04/27/2016   Bursitis, trochanteric    Cellulitis    Colon polyps    Depression    Gallstones    Headache    Heart palpitations    Hypercholesteremia    Hyperglycemia    Hypothyroidism due to Hashimoto's thyroiditis 07/04/2017   Insomnia    Iron deficiency anemia 04/27/2016   Morbid obesity (HCC) 07/04/2017   Obesity    Pneumonia    Right lumbar radiculopathy 05/02/2013   Sciatica    Sinusitis    Sleep apnea    CPAP   Vitamin B deficiency    Vitamin D deficiency     Past Surgical History: Past Surgical History:  Procedure Laterality Date   BIOPSY  01/12/2021   Procedure: BIOPSY;  Surgeon: Tressia Danas, MD;  Location: [facility];  Service: Gastroenterology;;   CHOLECYSTECTOMY N/A 04/27/2012   Procedure: LAPAROSCOPIC CHOLECYSTECTOMY;  Surgeon: Fabio Bering, MD;  Location: [facility];  Service: General;  Laterality: N/A;   COLONOSCOPY WITH PROPOFOL N/A 01/12/2021   Procedure: COLONOSCOPY WITH PROPOFOL;  Surgeon: Tressia Danas, MD;  [facility];  Service: Gastroenterology;  Laterality: N/A;   ESOPHAGOGASTRODUODENOSCOPY (EGD) WITH PROPOFOL N/A 01/12/2021   Procedure: ESOPHAGOGASTRODUODENOSCOPY (EGD) WITH PROPOFOL;  Surgeon: Tressia Danas, MD;  [facility];  Service: Gastroenterology;  Laterality: N/A;   EYE MUSCLE SURGERY Left    age 51   GASTRIC BYPASS     HEMOSTASIS CLIP PLACEMENT  01/12/2021   Procedure: HEMOSTASIS CLIP PLACEMENT;  Surgeon: Tressia Danas, MD;  Location: [facility];  Service: Gastroenterology;;   POLYPECTOMY  01/12/2021   Procedure: POLYPECTOMY;  Surgeon: Tressia Danas, MD;  [facility]: [facility];  Service: Gastroenterology;;   REFRACTIVE SURGERY Bilateral    WRIST SURGERY Left     Current Medications: Current Meds  Medication Sig   albuterol (PROVENTIL HFA) 108 (90 Base) MCG/ACT inhaler Inhale 2 puffs  into the lungs every 6 (six) hours as needed for wheezing or shortness of breath.   Cholecalciferol (VITAMIN D PO) Take 2 capsules by mouth at bedtime. 10,000 units   citalopram (CELEXA) 40 MG tablet TAKE 1 TABLET BY MOUTH  DAILY   cyanocobalamin (,VITAMIN B-12,) 1000 MCG/ML injection Take 1 mg injection weekly x 2 months, and then monthly   levothyroxine (SYNTHROID) 88 MCG tablet Take 1 tablet (88 mcg total) by mouth daily before breakfast. NO REFILLS. NEEDS LABS & F/U W/PCP.   LORazepam (ATIVAN) 1 MG tablet TAKE 1 TABLET BY MOUTH  DAILY AS NEEDED FOR ANXIETY   meloxicam (MOBIC) 15 MG tablet TAKE 1 TABLET BY MOUTH  DAILY AS NEEDED FOR PAIN   metoprolol succinate (TOPROL-XL) 25 MG 24 hr tablet Take 1 tablet (25 mg total) by mouth daily.   montelukast (SINGULAIR) 10 MG tablet TAKE 1 TABLET BY MOUTH AT  BEDTIME   omeprazole (PRILOSEC) 20 MG capsule Take 2 capsules (40 mg total) by mouth 2 (two) times daily before a meal.   Propylene Glycol (SYSTANE BALANCE) 0.6 % SOLN Place 1 drop into both eyes daily as needed (dry eyes).   Vitamin A 16109 units TABS Take 0.5 tablets (5,000 Units total) by mouth daily. (Patient taking differently: Take 10,000 Units by mouth daily.)     Allergies:    Patient has no known allergies.   Social History: Social History   Socioeconomic History   Marital status: Single    Spouse name: Not on file   Number of children: 0   Years of education: college   Highest education level: Not on file  Occupational History   Occupation: customer support agent    Comment: [employer]  Tobacco Use   Smoking status: Never    Passive exposure: Never   Smokeless tobacco: Never  Vaping Use   Vaping status: Never Used  Substance and Sexual Activity   Alcohol use: Yes    Alcohol/week: 1.0 standard drink of alcohol    Types: 1 Cans of beer per week    Comment: occas.   Drug use: No   Sexual activity: Not Currently    Birth control/protection: Abstinence  Other Topics Concern    Not on file  Social History Narrative   Patient is single   Patient is right-handed.   Patient works full time at a call center    Education some college   Caffeine coffee two cups daily and some times one soda            Social Determinants of Health   Financial Resource Strain: Not on file  Food Insecurity: Not on file  Transportation Needs: Not on file  Physical Activity: Not on file  Stress: Not on file  Social Connections: Unknown (07/05/2021)   [facility], [facility]   Social Network  Social Network: Not on file     Family History: The patient's family history includes Atrial fibrillation in her mother; Cancer - Other in her sister; Colon cancer (age of onset: 54) in her maternal grandmother; Colon polyps in her mother; Gout in her mother; Heart attack in her maternal grandmother; Hepatitis in her paternal grandfather; Lung cancer in her father; Lupus in her mother; Stroke in her maternal grandfather.  ROS:   All other ROS reviewed and negative. Pertinent positives noted in the HPI.     EKGs/Labs/Other Studies Reviewed:   The following studies were personally reviewed by me today:  EKG:  EKG is ordered today.    EKG Interpretation Date/Time:  Thursday November 03 2022 09:12:36 EDT Ventricular Rate:  56 PR Interval:  156 QRS Duration:  80 QT Interval:  438 QTC Calculation: 422 R Axis:   13  Text Interpretation: Sinus bradycardia with sinus arrhythmia LOW VOLTAGE No previous ECGs available Confirmed by Lennie Odor [phone]) on 11/03/2022 9:22:00 AM   TTE 09/01/2022  1. Left ventricular ejection fraction, by estimation, is 55 to 60%. The  left ventricle has normal function. The left ventricle has no regional  wall motion abnormalities. Left ventricular diastolic parameters were  normal.   2. Right ventricular systolic function is normal. The right ventricular  size is mildly enlarged. There is normal pulmonary artery systolic  pressure. The  estimated right ventricular systolic pressure is 26.6 mmHg.   3. Left atrial size was mildly dilated.   4. The mitral valve is normal in structure. Trivial mitral valve  regurgitation. No evidence of mitral stenosis.   5. The aortic valve is tricuspid. Aortic valve regurgitation is trivial.  No aortic stenosis is present.   6. The inferior vena cava is normal in size with greater than 50%  respiratory variability, suggesting right atrial pressure of 3 mmHg.   Recent Labs: 12/01/2021: BUN 16; Creatinine, Ser 0.74; Hemoglobin 14.0; Platelets 211; Potassium 3.4; Sodium 139 08/01/2022: TSH 3.580   Recent Lipid Panel No results found for: "CHOL", "TRIG", "HDL", "CHOLHDL", "VLDL", "LDLCALC", "LDLDIRECT"  Physical Exam:   VS:  BP 126/80 (BP Location: Right Arm, Patient Position: Sitting, Cuff Size: Normal)   Pulse (!) 56   Ht 5\' 6"  (1.676 m)   Wt (!) 382 lb 12.8 oz (173.6 kg)   SpO2 97%   BMI 61.79 kg/m    Wt Readings from Last 3 Encounters:  11/03/22 (!) 382 lb 12.8 oz (173.6 kg)  10/13/22 (!) 381 lb 6.4 oz (173 kg)  08/01/22 (!) 375 lb 9.6 oz (170.4 kg)    General: Well nourished, well developed, in no acute distress Head: Atraumatic, normal size  Eyes: PEERLA, EOMI  Neck: Supple, no JVD Endocrine: No thryomegaly Cardiac: Normal S1, S2; RRR; no murmurs, rubs, or gallops Lungs: Clear to auscultation bilaterally, no wheezing, rhonchi or rales  Abd: Soft, nontender, no hepatomegaly  Ext: mild edema RLE Musculoskeletal: No deformities, BUE and BLE strength normal and equal Skin: Warm and dry, no rashes   Neuro: Alert and oriented to person, place, time, and situation, CNII-XII grossly intact, no focal deficits  Psych: Normal mood and affect   ASSESSMENT:   Kristy Sharp is a 57 y.o. female who presents for the following: 1. Paroxysmal atrial fibrillation (HCC)   2. Obesity, morbid, BMI 50 or higher (HCC)   3. Venous insufficiency   4. OSA (obstructive sleep apnea)     PLAN:    Assessment and Plan  Obstructive Sleep Apnea Not a candidate for Inspire due to weight. Awaiting fitting for a new mask with a new company. -Continue working with Dr. Mayford Knife for sleep apnea management. -Contact the new company to set up an appointment for mask fitting.  Paroxysmal Atrial Fibrillation No current symptoms. EKG today showed normal sinus rhythm. CHADS-VASc score of 1, indicating low stroke risk. -Continue Metoprolol for atrial fibrillation management. -no AC indicated   Venous Insufficiency Mild leg swelling, improved from previous. -Continue Lasix as needed. -Continue leg elevation and salt restriction.  Weight Management Ongoing efforts to lose weight. -Continue current weight loss efforts.  Follow-up in 1 year unless symptoms of heart racing or other changes occur.           Disposition: Return in about 1 year (around 11/03/2023).  Medication Adjustments/Labs and Tests Ordered: Current medicines are reviewed at length with the patient today.  Concerns regarding medicines are outlined above.  Orders Placed This Encounter  Procedures   EKG 12-Lead   No orders of the defined types were placed in this encounter.  Patient Instructions  Medication Instructions:  Not needed   *If you need a refill on your cardiac medications before your next appointment, please call your pharmacy*   Lab Work:  Not needed  Testing/Procedures: Not needed   Follow-Up: At [facility], you and your health needs are our priority.  As part of our continuing mission to provide you with exceptional heart care, we have created designated Provider Care Teams.  These Care Teams include your primary Cardiologist (physician) and Advanced Practice Providers (APPs -  Physician Assistants and Nurse Practitioners) who all work together to provide you with the care you need, when you need it.  We recommend signing up for the patient portal called "MyChart".  Sign up information is  provided on this After Visit Summary.  MyChart is used to connect with patients for Virtual Visits (Telemedicine).  Patients are able to view lab/test results, encounter notes, upcoming appointments, etc.  Non-urgent messages can be sent to your provider as well.   To learn more about what you can do with MyChart, go to [email or web address].    Your next appointment:   12 month(s)  The format for your next appointment:   In Person  Provider:   Reatha Harps, MD      Time Spent with Patient: I have spent a total of 25 minutes with patient reviewing hospital notes, telemetry, EKGs, labs and examining the patient as well as establishing an assessment and plan that was discussed with the patient.  > 50% of time was spent in direct patient care.  Signed, Lenna Gilford. Flora Lipps, MD, [facility]  [facility]  [address] [place], [place] [address] [phone]  11/03/2022 9:51 AM

## 2022-11-02 ENCOUNTER — Other Ambulatory Visit: Payer: Self-pay | Admitting: *Deleted

## 2022-11-02 MED ORDER — METOPROLOL SUCCINATE ER 25 MG PO TB24: 25.0000 mg | ORAL_TABLET | Freq: Every day | ORAL | 2 refills | Status: DC

## 2022-11-03 ENCOUNTER — Ambulatory Visit: Payer: Managed Care, Other (non HMO) | Attending: Cardiovascular Disease | Admitting: Cardiovascular Disease

## 2022-11-03 ENCOUNTER — Encounter: Payer: Self-pay | Admitting: Cardiovascular Disease

## 2022-11-03 VITALS — BP 126/80 | HR 56 | Ht 66.0 in | Wt 382.8 lb

## 2022-11-03 DIAGNOSIS — I480 Paroxysmal atrial fibrillation: Secondary | ICD-10-CM

## 2022-11-03 DIAGNOSIS — G4733 Obstructive sleep apnea (adult) (pediatric): Secondary | ICD-10-CM

## 2022-11-03 DIAGNOSIS — I872 Venous insufficiency (chronic) (peripheral): Secondary | ICD-10-CM

## 2022-11-03 NOTE — Patient Instructions (Addendum)
Medication Instructions:  Not needed   *If you need a refill on your cardiac medications before your next appointment, please call your pharmacy*   Lab Work:  Not needed  Testing/Procedures: Not needed   Follow-Up: At [facility], you and your health needs are our priority.  As part of our continuing mission to provide you with exceptional heart care, we have created designated Provider Care Teams.  These Care Teams include your primary Cardiologist (physician) and Advanced Practice Providers (APPs -  Physician Assistants and Nurse Practitioners) who all work together to provide you with the care you need, when you need it.  We recommend signing up for the patient portal called "MyChart".  Sign up information is provided on this After Visit Summary.  MyChart is used to connect with patients for Virtual Visits (Telemedicine).  Patients are able to view lab/test results, encounter notes, upcoming appointments, etc.  Non-urgent messages can be sent to your provider as well.   To learn more about what you can do with MyChart, go to [email or web address].    Your next appointment:   12 month(s)  The format for your next appointment:   In Person  Provider:   Reatha Harps, MD

## 2022-11-15 ENCOUNTER — Encounter: Payer: Self-pay | Admitting: Cardiology

## 2022-11-15 DIAGNOSIS — I4819 Other persistent atrial fibrillation: Secondary | ICD-10-CM

## 2022-11-15 DIAGNOSIS — R0683 Snoring: Secondary | ICD-10-CM

## 2022-11-15 DIAGNOSIS — G4733 Obstructive sleep apnea (adult) (pediatric): Secondary | ICD-10-CM

## 2022-11-29 ENCOUNTER — Encounter (HOSPITAL_COMMUNITY): Payer: Self-pay

## 2022-11-29 ENCOUNTER — Ambulatory Visit (HOSPITAL_COMMUNITY)
Admission: EM | Admit: 2022-11-29 | Discharge: 2022-11-29 | Disposition: A | Discharge status: Home / Self Care | Payer: Managed Care, Other (non HMO) | Attending: Internal Medicine | Admitting: Internal Medicine

## 2022-11-29 DIAGNOSIS — W19XXXA Unspecified fall, initial encounter: Secondary | ICD-10-CM

## 2022-11-29 DIAGNOSIS — L03115 Cellulitis of right lower limb: Secondary | ICD-10-CM

## 2022-11-29 DIAGNOSIS — S8011XA Contusion of right lower leg, initial encounter: Secondary | ICD-10-CM | POA: Diagnosis not present

## 2022-11-29 MED ORDER — AMOXICILLIN-POT CLAVULANATE 875-125 MG PO TABS: 1.0000 | ORAL_TABLET | Freq: Two times a day (BID) | ORAL | 0 refills | Status: AC

## 2022-11-29 NOTE — ED Triage Notes (Signed)
Pt states she fell last week c/o pain to RLE.  Pts RLE swollen, red and bruised.

## 2022-11-29 NOTE — ED Provider Notes (Signed)
[facility]    CSN: [id number] Arrival date & time: 11/29/22  1255      History   Chief Complaint Chief Complaint  Patient presents with   Fall    HPI Kristy Sharp is a 57 y.o. female.   Patient presents to urgent care for evaluation of swelling to the right lower extremity as a result of fall that happened 6 days ago.  Patient was walking when she accidentally tripped over her pet cat who was underneath her feet.  She fell forwards striking her head to the floor over the right eye and she landed onto an outstretched right hand.  She does not have any pain to the head where the right wrist but is experiencing significant bruising and tenderness to the anterior right calf.  She has monitored the bruising over the last 6 days and states it is grown significantly in size and tenderness/warmth.  There is a small abrasion to the anterior aspect of the right calf with localized soft tissue swelling and ecchymosis.  Denies chest pain and shortness of breath.  No fevers, chills, body aches, nausea, vomiting, vision changes, head pain, neck pain, anticoagulation use, or pain to the ankle, foot, or any of the right leg. No history of DVT reported.  History of cellulitis and venous insufficiency reported.  Has not attempted use of any over-the-counter medications to help with symptoms PTA.     Past Medical History:  Diagnosis Date   Allergic rhinitis due to other allergen    Allergy    Anemia    Anxiety    Asthma    B12 deficiency 04/27/2016   Bursitis, trochanteric    Cellulitis    Colon polyps    Depression    Gallstones    Headache    Heart palpitations    Hypercholesteremia    Hyperglycemia    Hypothyroidism due to Hashimoto's thyroiditis 07/04/2017   Insomnia    Iron deficiency anemia 04/27/2016   Morbid obesity (HCC) 07/04/2017   Obesity    Pneumonia    Right lumbar radiculopathy 05/02/2013   Sciatica    Sinusitis    Sleep apnea    CPAP   Vitamin B  deficiency    Vitamin D deficiency     Patient Active Problem List   Diagnosis Date Noted   Viral syndrome 11/30/2021   Moderate recurrent major depression (HCC) 03/23/2021   Acute right-sided thoracic back pain 03/23/2021   Globus sensation    Adenomatous polyp of ascending colon    Adenomatous polyp of sigmoid colon    Migraine with aura and without status migrainosus, not intractable 01/11/2021   Abnormal sensation of eye 10/22/2020   Oropharyngeal dysphagia 10/22/2020   Arthralgia 09/03/2020   Urinary frequency 09/03/2020   Obstructive sleep apnea syndrome, mild 04/20/2020   Laryngopharyngeal reflux 04/09/2020   Acute sinusitis 04/09/2020   Vitamin D deficiency 07/08/2019   Copper deficiency 07/08/2019   Chronic right-sided headache 03/12/2018   Vitamin A deficiency 03/12/2018   Edema of both lower extremities due to peripheral venous insufficiency 03/12/2018   Gastroesophageal reflux disease 03/12/2018   Right knee pain 03/08/2018   Chronic insomnia 03/08/2018   Chronic use of benzodiazepine for therapeutic purpose 03/08/2018   Chronic anxiety 03/08/2018   History of colon polyps 02/28/2018   Aura 02/28/2018   Hypothyroidism due to Hashimoto's thyroiditis 07/04/2017   Obesity, Class III, BMI 40-49.9 (morbid obesity) (HCC) 07/04/2017   H/O gastric bypass 04/27/2016  B12 deficiency 04/27/2016   Iron deficiency anemia 04/27/2016    Past Surgical History:  Procedure Laterality Date   BIOPSY  01/12/2021   Procedure: BIOPSY;  Surgeon: Tressia Danas, MD;  Location: [facility];  Service: Gastroenterology;;   CHOLECYSTECTOMY N/A 04/27/2012   Procedure: LAPAROSCOPIC CHOLECYSTECTOMY;  Surgeon: Fabio Bering, MD;  Location: [facility];  Service: General;  Laterality: N/A;   COLONOSCOPY WITH PROPOFOL N/A 01/12/2021   Procedure: COLONOSCOPY WITH PROPOFOL;  Surgeon: Tressia Danas, MD;  Location: [facility];  Service: Gastroenterology;  Laterality: N/A;    ESOPHAGOGASTRODUODENOSCOPY (EGD) WITH PROPOFOL N/A 01/12/2021   Procedure: ESOPHAGOGASTRODUODENOSCOPY (EGD) WITH PROPOFOL;  Surgeon: Tressia Danas, MD;  Location: [facility];  Service: Gastroenterology;  Laterality: N/A;   EYE MUSCLE SURGERY Left    age 71   GASTRIC BYPASS     HEMOSTASIS CLIP PLACEMENT  01/12/2021   Procedure: HEMOSTASIS CLIP PLACEMENT;  Surgeon: Tressia Danas, MD;  Location: [facility];  Service: Gastroenterology;;   POLYPECTOMY  01/12/2021   Procedure: POLYPECTOMY;  Surgeon: Tressia Danas, MD;  Location: [facility];  Service: Gastroenterology;;   REFRACTIVE SURGERY Bilateral    WRIST SURGERY Left     OB History   No obstetric history on file.      Home Medications    Prior to Admission medications   Medication Sig Start Date End Date Taking? Authorizing Provider  amoxicillin-clavulanate (AUGMENTIN) 875-125 MG tablet Take 1 tablet by mouth every 12 (twelve) hours. 11/29/22  Yes Carlisle Beers, FNP  albuterol (PROVENTIL HFA) 108 (90 Base) MCG/ACT inhaler Inhale 2 puffs into the lungs every 6 (six) hours as needed for wheezing or shortness of breath. 10/13/20   Everrett Coombe, DO  Cholecalciferol (VITAMIN D PO) Take 2 capsules by mouth at bedtime. 10,000 units    [provider]  citalopram (CELEXA) 40 MG tablet TAKE 1 TABLET BY MOUTH  DAILY 06/16/21   Everrett Coombe, DO  cyanocobalamin (,VITAMIN B-12,) 1000 MCG/ML injection Take 1 mg injection weekly x 2 months, and then monthly 12/25/20   Artis Delay, MD  folic acid (FOLVITE) 400 MCG tablet Take 400 mcg by mouth daily.    [provider]  furosemide (LASIX) 20 MG tablet Take 1 tablet (20 mg total) by mouth daily as needed. Patient not taking: Reported on 10/13/2022 03/12/18   Carlis Stable, PA-C  levothyroxine (SYNTHROID) 88 MCG tablet Take 1 tablet (88 mcg total) by mouth daily before breakfast. NO REFILLS. NEEDS LABS & F/U W/PCP. 04/04/22   Everrett Coombe, DO   LORazepam (ATIVAN) 1 MG tablet TAKE 1 TABLET BY MOUTH  DAILY AS NEEDED FOR ANXIETY 04/12/21   Everrett Coombe, DO  meloxicam (MOBIC) 15 MG tablet TAKE 1 TABLET BY MOUTH  DAILY AS NEEDED FOR PAIN 04/12/21   Everrett Coombe, DO  metoprolol succinate (TOPROL-XL) 25 MG 24 hr tablet Take 1 tablet (25 mg total) by mouth daily. 11/02/22   Sande Rives, MD  montelukast (SINGULAIR) 10 MG tablet TAKE 1 TABLET BY MOUTH AT  BEDTIME 06/16/21   Everrett Coombe, DO  omeprazole (PRILOSEC) 20 MG capsule Take 2 capsules (40 mg total) by mouth 2 (two) times daily before a meal. 11/15/21   Tressia Danas, MD  ondansetron (ZOFRAN-ODT) 4 MG disintegrating tablet Take 1 tablet (4 mg total) by mouth every 8 (eight) hours as needed for nausea or vomiting. 11/30/21   Everrett Coombe, DO  Propylene Glycol (SYSTANE BALANCE) 0.6 % SOLN Place 1 drop into both eyes daily as  needed (dry eyes).    [provider]  traZODone (DESYREL) 50 MG tablet TAKE 1 TABLET BY MOUTH AT  BEDTIME AS NEEDED FOR SLEEP Patient not taking: Reported on 11/03/2022 04/12/21   Everrett Coombe, DO  Vitamin A 16109 units TABS Take 0.5 tablets (5,000 Units total) by mouth daily. Patient taking differently: Take 10,000 Units by mouth daily. 03/20/18   Carlis Stable, PA-C    Family History Family History  Problem Relation Age of Onset   Lupus Mother    Atrial fibrillation Mother    Gout Mother    Colon polyps Mother    Lung cancer Father    Cancer - Other Sister        appendix   Colon cancer Maternal Grandmother 74   Heart attack Maternal Grandmother    Stroke Maternal Grandfather    Hepatitis Paternal Grandfather        blood transfusion    Social History Social History   Tobacco Use   Smoking status: Never    Passive exposure: Never   Smokeless tobacco: Never  Vaping Use   Vaping status: Never Used  Substance Use Topics   Alcohol use: Yes    Alcohol/week: 1.0 standard drink of alcohol    Types: 1 Cans of beer  per week    Comment: occas.   Drug use: No     Allergies   Patient has no known allergies.   Review of Systems Review of Systems Per HPI  Physical Exam Triage Vital Signs ED Triage Vitals  Encounter Vitals Group     BP 11/29/22 1333 125/74     Systolic BP Percentile --      Diastolic BP Percentile --      Pulse Rate 11/29/22 1333 71     Resp 11/29/22 1333 16     Temp 11/29/22 1333 98.2 F (36.8 C)     Temp Source 11/29/22 1333 Oral     SpO2 11/29/22 1333 97 %     Weight --      Height --      Head Circumference --      Peak Flow --      Pain Score 11/29/22 1335 4     Pain Loc --      Pain Education --      Exclude from Growth Chart --    No data found.  Updated Vital Signs BP 125/74 (BP Location: Left Arm)   Pulse 71   Temp 98.2 F (36.8 C) (Oral)   Resp 16   LMP 02/21/2018 (Approximate)   SpO2 97%   Visual Acuity Right Eye Distance:   Left Eye Distance:   Bilateral Distance:    Right Eye Near:   Left Eye Near:    Bilateral Near:     Physical Exam Vitals and nursing note reviewed.  Constitutional:      Appearance: She is not ill-appearing or toxic-appearing.  HENT:     Head: Normocephalic and atraumatic.     Right Ear: Hearing and external ear normal.     Left Ear: Hearing and external ear normal.     Nose: Nose normal.     Mouth/Throat:     Lips: Pink.  Eyes:     General: Lids are normal. Vision grossly intact. Gaze aligned appropriately.     Extraocular Movements: Extraocular movements intact.     Conjunctiva/sclera: Conjunctivae normal.  Cardiovascular:     Rate and Rhythm: Normal rate and regular rhythm.  Heart sounds: Normal heart sounds, S1 normal and S2 normal.  Pulmonary:     Effort: Pulmonary effort is normal. No respiratory distress.     Breath sounds: Normal breath sounds and air entry.  Musculoskeletal:     Cervical back: Neck supple.  Skin:    General: Skin is warm and dry.     Capillary Refill: Capillary refill takes  less than 2 seconds.     Findings: Bruising, ecchymosis, erythema, signs of injury and wound (Abrasion to the right anterior lateral thigh) present. No rash.          Comments: Right leg swelling, erythema, significant warmth to palpation, and tenderness.  Nontender to the popliteal region of the right lower leg.  +2 right popliteal pulse.  Negative Homans' sign bilaterally.  +2 anterior tibialis pulse present.  Sensation intact distally to swelling.  Ambulatory with steady gait without difficulty.  There is a palpable hematoma to the anterior lateral aspect of the right anterior calf near abrasion.  Neurological:     General: No focal deficit present.     Mental Status: She is alert and oriented to person, place, and time. Mental status is at baseline.     Cranial Nerves: Cranial nerves 2-12 are intact. No dysarthria or facial asymmetry.     Sensory: Sensation is intact.     Motor: Motor function is intact.     Coordination: Coordination is intact.     Gait: Gait is intact.     Comments: Strength and sensation intact to bilateral upper and lower extremities (5/5). Moves all 4 extremities with normal coordination voluntarily. Non-focal neuro exam.   Psychiatric:        Mood and Affect: Mood normal.        Speech: Speech normal.        Behavior: Behavior normal.        Thought Content: Thought content normal.        Judgment: Judgment normal.    Right lower extremity   UC Treatments / Results  Labs (all labs ordered are listed, but only abnormal results are displayed) Labs Reviewed - No data to display  EKG   Radiology No results found.  Procedures Procedures (including critical care time)  Medications Ordered in UC Medications - No data to display  Initial Impression / Assessment and Plan / UC Course  I have reviewed the triage vital signs and the nursing notes.  Pertinent labs & imaging results that were available during my care of the patient were reviewed by me and  considered in my medical decision making (see chart for details).   1.  Fall, cellulitis of right leg, traumatic ecchymosis of right lower leg Presentation concerning for cellulitis versus DVT.  More suspicious for cellulitis than DVT.  Denna Haggard' sign negative bilaterally.  Will treat with Augmentin twice daily for 7 days.  Outpatient DVT clinic is unable to get patient in for DVT study to the right lower extremity today, they have an appointment for tomorrow at 3 PM.  This should be sufficient.  Advised to attend appointment as scheduled and go to the nearest emergency department should symptoms worsen significantly in the next 24 hours.  Infection return precautions discussed.  PCP follow-up in the next 3 to 5 days for recheck.   No indication for CT imaging of the head based on [place] head CT trauma scoring.  Neurologically intact to baseline.  Counseled patient on potential for adverse effects with medications prescribed/recommended today, strict ER  and return-to-clinic precautions discussed, patient verbalized understanding.    Final Clinical Impressions(s) / UC Diagnoses   Final diagnoses:  Fall, initial encounter  Cellulitis of leg, right  Traumatic ecchymosis of right lower leg, initial encounter     Discharge Instructions      I am concerned about a blood clot to the right leg. Please go to the hospital next door to have this DVT study performed to rule out blood clot. I would like to go ahead and treat for possible cellulitis (infection) to the right leg. I will call you with the results of the DVT study this afternoon once I receive them.   Schedule an appointment with your primary care provider for follow-up. Monitor the site for worsening signs of infection such as redness, swelling, pus, or pain.  Apply warm compresses to the right lower extremity to help add blood flow to the area and reduce swelling.  Elevate the legs.  If you develop any new or worsening symptoms or  if your symptoms do not start to improve, please return here or follow-up with your primary care provider. If your symptoms are severe, please go to the emergency room.     ED Prescriptions     Medication Sig Dispense Auth. Provider   amoxicillin-clavulanate (AUGMENTIN) 875-125 MG tablet Take 1 tablet by mouth every 12 (twelve) hours. 14 tablet Carlisle Beers, FNP      PDMP not reviewed this encounter.   Carlisle Beers, FNP 11/29/22 1440

## 2022-11-29 NOTE — Discharge Instructions (Addendum)
I am concerned about a blood clot to the right leg. Please go to the hospital next door to have this DVT study performed to rule out blood clot. I would like to go ahead and treat for possible cellulitis (infection) to the right leg. I will call you with the results of the DVT study this afternoon once I receive them.   Schedule an appointment with your primary care provider for follow-up. Monitor the site for worsening signs of infection such as redness, swelling, pus, or pain.  Apply warm compresses to the right lower extremity to help add blood flow to the area and reduce swelling.  Elevate the legs.  If you develop any new or worsening symptoms or if your symptoms do not start to improve, please return here or follow-up with your primary care provider. If your symptoms are severe, please go to the emergency room.

## 2022-11-29 NOTE — ED Notes (Signed)
Called [facility] and vascular to schedule DVT study soonest they can do is 11/30/2022 at 3pm on [address]. Provider advised and states ok to wait but if sx get worse go directly to ED. Pt advised and verbalized understand

## 2022-11-30 ENCOUNTER — Ambulatory Visit (HOSPITAL_COMMUNITY)
Admission: RE | Admit: 2022-11-30 | Discharge: 2022-11-30 | Disposition: A | Discharge status: Home / Self Care | Payer: Managed Care, Other (non HMO) | Source: Ambulatory Visit | Attending: Vascular Surgery | Admitting: Vascular Surgery

## 2022-11-30 DIAGNOSIS — R600 Localized edema: Secondary | ICD-10-CM | POA: Diagnosis not present

## 2022-11-30 DIAGNOSIS — M7989 Other specified soft tissue disorders: Secondary | ICD-10-CM

## 2022-11-30 DIAGNOSIS — W19XXXA Unspecified fall, initial encounter: Secondary | ICD-10-CM | POA: Diagnosis not present

## 2022-12-01 ENCOUNTER — Telehealth: Payer: Self-pay | Admitting: *Deleted

## 2022-12-01 ENCOUNTER — Other Ambulatory Visit: Payer: Self-pay | Admitting: Cardiovascular Disease

## 2022-12-01 NOTE — Addendum Note (Signed)
Addended by: Reesa Chew on: 12/01/2022 01:08 PM   Modules accepted: Orders

## 2022-12-01 NOTE — Telephone Encounter (Signed)
Spoke to patient and informed her that she will need to get a mask and be compliant for at least 30 days and then we can refer her to the dme she prefers to get established for her supplies and compliance. Patient was advised she could get a mask fit at the sleep lab to assure she gets a correct mask that fits or she could get an appointment with Brandi to get a mask. Patient would like the mask fit at the sleep lab. Patient was grateful for my call.

## 2023-01-04 ENCOUNTER — Ambulatory Visit: Payer: Managed Care, Other (non HMO) | Admitting: Cardiology

## 2023-11-02 ENCOUNTER — Encounter: Payer: Self-pay | Admitting: Cardiovascular Disease

## 2024-01-07 NOTE — Progress Notes (Unsigned)
 Cardiology Office Note:  .   Date:  01/09/2024  ID:  Kristy Sharp, DOB 04-May-1965, MRN [id number] PCP: Kristy Bolt, PA  [facility] Providers Cardiologist:  Darryle ONEIDA Decent, MD {  History of Present Illness: .   No chief complaint on file.   Kristy Sharp is a 58 y.o. female with history of obesity, pAF, OSA who presents for follow-up.    History of Present Illness   Kristy Sharp is a 58 year old female with paroxysmal atrial fibrillation who presents for follow-up.  She experiences increased palpitations, approximately 10 to 15 times daily, with episodes lasting from one to five minutes. She is currently taking metoprolol  25 mg once daily. Her last heart monitor, conducted last year, showed brief episodes of arrhythmia. No chest pain or significant dyspnea is reported.  She has a history of morbid obesity with a BMI of 63 and acknowledges not exercising as she should. Recent life stressors, including the passing of her mother in July, have impacted her ability to maintain an exercise routine.  She notes improvement in leg swelling. She has not been taking Lasix  recently, and compression stockings are difficult to use due to discomfort and rolling down.  She has obstructive sleep apnea and has faced challenges obtaining a suitable CPAP mask due to issues with medical supply companies. She has tried various masks without success, citing a narrow nasal bridge as a complicating factor.  She reports occasional shortness of breath during physical activity, such as walking, but denies any chest pain. Her last TSH check in May was normal.  She mentions a recent diagnosis of arthritis in her hand, exacerbated by a movement while putting on a seatbelt. This is related to an old injury from the 1980s, which required surgery at the time.            Problem List 1. Morbid obesity  -BMI 63 -s/p bariatric surgery 2. B12 deficiency 3. Venous insufficiency  -bilateral great  saphenous veins 4. Atrial tachycardia -Brief episodes ~8 seconds on zio  5. Persistent Afib -CHADSVASC=1 (female) -Dx 08/01/2022 -no recurrence on monitor 08/01/2022 6. OSA -moderate 02/25/2020 7. Iron  deficiency anemia     ROS: All other ROS reviewed and negative. Pertinent positives noted in the HPI.     Studies Reviewed: SABRA   EKG Interpretation Date/Time:  Tuesday January 09 2024 08:49:42 EST Ventricular Rate:  53 PR Interval:  142 QRS Duration:  86 QT Interval:  462 QTC Calculation: 433 R Axis:   22  Text Interpretation: Sinus bradycardia with sinus arrhythmia Low voltage QRS Confirmed by [name] [phone]) on 01/09/2024 8:52:09 AM   Physical Exam:   VS:  BP 114/72 (BP Location: Left Arm, Patient Position: Sitting, Cuff Size: Large)   Pulse (!) 53   Ht 5' 6 (1.676 m)   Wt (!) 393 lb (178.3 kg)   LMP 02/21/2018 (Approximate)   BMI 63.43 kg/m    Wt Readings from Last 3 Encounters:  01/09/24 (!) 393 lb (178.3 kg)  11/03/22 (!) 382 lb 12.8 oz (173.6 kg)  10/13/22 (!) 381 lb 6.4 oz (173 kg)    GEN: Well nourished, well developed in no acute distress NECK: No JVD; No carotid bruits CARDIAC: RRR, no murmurs, rubs, gallops RESPIRATORY:  Clear to auscultation without rales, wheezing or rhonchi  ABDOMEN: Soft, non-tender, non-distended EXTREMITIES:  trace edema  ASSESSMENT AND PLAN: .   Assessment and Plan    Paroxysmal atrial fibrillation Increased palpitations, likely SVT. No anticoagulation  needed per CHADSVASc score of 1. - Increased metoprolol  succinate to 25 mg twice daily. - Ordered CMP and lipid panel. - Scheduled follow-up in 6 months.  Obstructive sleep apnea Difficulty with CPAP mask acquisition, contributing to cardiovascular issues. - Contacted Dr. Shlomo for CPAP mask recommendations.  Morbid obesity (BMI 63) Limited exercise due to stressors. - Encouraged initiation of exercise regimen.   Venous insufficiency - Controlled with conservative  measures              Follow-up: Return in about 6 months (around 07/08/2024).  Signed, Darryle DASEN. Barbaraann, MD, [facility]  [facility]  [address] [place], [place] [address] [phone]  9:07 AM

## 2024-01-09 ENCOUNTER — Ambulatory Visit: Attending: Cardiovascular Disease | Admitting: Cardiovascular Disease

## 2024-01-09 ENCOUNTER — Encounter: Payer: Self-pay | Admitting: Cardiovascular Disease

## 2024-01-09 VITALS — BP 114/72 | HR 53 | Ht 66.0 in | Wt 393.0 lb

## 2024-01-09 DIAGNOSIS — I872 Venous insufficiency (chronic) (peripheral): Secondary | ICD-10-CM

## 2024-01-09 DIAGNOSIS — I480 Paroxysmal atrial fibrillation: Secondary | ICD-10-CM | POA: Diagnosis not present

## 2024-01-09 DIAGNOSIS — G4733 Obstructive sleep apnea (adult) (pediatric): Secondary | ICD-10-CM

## 2024-01-09 LAB — LIPID PANEL
Chol/HDL Ratio: 3 ratio (ref 0.0–4.4)
Cholesterol, Total: 170 mg/dL (ref 100–199)
HDL: 56 mg/dL (ref >39)
LDL Chol Calc (NIH): 98 mg/dL (ref 0–99)
Triglycerides: 84 mg/dL (ref 0–149)
VLDL Cholesterol Cal: 16 mg/dL (ref 5–40)

## 2024-01-09 LAB — COMPREHENSIVE METABOLIC PANEL WITH GFR
ALT: 22 IU/L (ref 0–32)
AST: 31 IU/L (ref 0–40)
Albumin: 3.8 g/dL (ref 3.8–4.9)
Alkaline Phosphatase: 78 IU/L (ref 49–135)
BUN/Creatinine Ratio: 25 — ABNORMAL HIGH (ref 9–23)
BUN: 16 mg/dL (ref 6–24)
Bilirubin Total: 0.4 mg/dL (ref 0.0–1.2)
CO2: 24 mmol/L (ref 20–29)
Calcium: 9.2 mg/dL (ref 8.7–10.2)
Chloride: 106 mmol/L (ref 96–106)
Creatinine, Ser: 0.64 mg/dL (ref 0.57–1.00)
Globulin, Total: 2.2 g/dL (ref 1.5–4.5)
Glucose: 85 mg/dL (ref 70–99)
Potassium: 4.4 mmol/L (ref 3.5–5.2)
Sodium: 141 mmol/L (ref 134–144)
Total Protein: 6 g/dL (ref 6.0–8.5)
eGFR: 102 mL/min/1.73 (ref >59)

## 2024-01-09 MED ORDER — METOPROLOL SUCCINATE ER 25 MG PO TB24: 25.0000 mg | ORAL_TABLET | Freq: Two times a day (BID) | ORAL | 3 refills | Status: AC

## 2024-01-09 NOTE — Addendum Note (Signed)
 Addended by: Jenia Klepper on: 01/09/2024 09:12 AM   Modules accepted: Orders

## 2024-01-09 NOTE — Patient Instructions (Signed)
 Medication Instructions:  Start Metoprolol  25mg  2 times per day *If you need a refill on your cardiac medications before your next appointment, please call your pharmacy*  Lab Work: Today: CMET, Lipid If you have labs (blood work) drawn today and your tests are completely normal, you will receive your results only by: MyChart Message (if you have MyChart) OR A paper copy in the mail If you have any lab test that is abnormal or we need to change your treatment, we will call you to review the results.  Testing/Procedures: None   Follow-Up: At [facility], you and your health needs are our priority.  As part of our continuing mission to provide you with exceptional heart care, our providers are all part of one team.  This team includes your primary Cardiologist (physician) and Advanced Practice Providers or APPs (Physician Assistants and Nurse Practitioners) who all work together to provide you with the care you need, when you need it.  Your next appointment:   6 month(s)  Provider:   Darryle ONEIDA Decent, MD    We recommend signing up for the patient portal called MyChart.  Sign up information is provided on this After Visit Summary.  MyChart is used to connect with patients for Virtual Visits (Telemedicine).  Patients are able to view lab/test results, encounter notes, upcoming appointments, etc.  Non-urgent messages can be sent to your provider as well.   To learn more about what you can do with MyChart, go to [email or web address].   Other Instructions None

## 2024-01-10 ENCOUNTER — Ambulatory Visit: Payer: Self-pay | Admitting: Cardiovascular Disease

## 2024-01-24 ENCOUNTER — Ambulatory Visit (HOSPITAL_BASED_OUTPATIENT_CLINIC_OR_DEPARTMENT_OTHER): Attending: Cardiology

## 2024-02-07 ENCOUNTER — Telehealth: Payer: Self-pay

## 2024-02-07 NOTE — Telephone Encounter (Signed)
 Pt. Wants a referral to a new provider close by her home. She suggested [facility]. Dr Lynwood or Dr. Geryl.[address] [place], [place]

## 2024-02-08 ENCOUNTER — Telehealth: Payer: Self-pay

## 2024-02-08 NOTE — Telephone Encounter (Signed)
 Faxed referral to [facility] to Hematology to [phone] per her request. Received a fax confirmation.  [place] is aware referral sent.
